# Patient Record
Sex: Female | Born: 1935
Health system: Southern US, Community
[De-identification: ages and names within clinical notes are randomized; demographics above are authoritative.]

## PROBLEM LIST (undated history)

## (undated) DIAGNOSIS — R42 Dizziness and giddiness: Secondary | ICD-10-CM

## (undated) DIAGNOSIS — K5790 Diverticulosis of intestine, part unspecified, without perforation or abscess without bleeding: Secondary | ICD-10-CM

## (undated) DIAGNOSIS — F32A Depression, unspecified: Secondary | ICD-10-CM

## (undated) DIAGNOSIS — F329 Major depressive disorder, single episode, unspecified: Secondary | ICD-10-CM

## (undated) DIAGNOSIS — Z8719 Personal history of other diseases of the digestive system: Secondary | ICD-10-CM

## (undated) DIAGNOSIS — R05 Cough: Secondary | ICD-10-CM

## (undated) DIAGNOSIS — D649 Anemia, unspecified: Secondary | ICD-10-CM

## (undated) DIAGNOSIS — R112 Nausea with vomiting, unspecified: Secondary | ICD-10-CM

## (undated) DIAGNOSIS — R062 Wheezing: Secondary | ICD-10-CM

## (undated) DIAGNOSIS — R0602 Shortness of breath: Secondary | ICD-10-CM

## (undated) DIAGNOSIS — E039 Hypothyroidism, unspecified: Secondary | ICD-10-CM

## (undated) DIAGNOSIS — M1990 Unspecified osteoarthritis, unspecified site: Secondary | ICD-10-CM

## (undated) DIAGNOSIS — R059 Cough, unspecified: Secondary | ICD-10-CM

## (undated) DIAGNOSIS — Z9889 Other specified postprocedural states: Secondary | ICD-10-CM

## (undated) DIAGNOSIS — C801 Malignant (primary) neoplasm, unspecified: Secondary | ICD-10-CM

## (undated) DIAGNOSIS — F0390 Unspecified dementia without behavioral disturbance: Secondary | ICD-10-CM

## (undated) DIAGNOSIS — K219 Gastro-esophageal reflux disease without esophagitis: Secondary | ICD-10-CM

## (undated) HISTORY — PX: JOINT REPLACEMENT: SHX530

## (undated) HISTORY — PX: CATARACT EXTRACTION, BILATERAL: SHX1313

## (undated) HISTORY — PX: TOTAL SHOULDER ARTHROPLASTY: SHX126

## (undated) HISTORY — PX: CHOLECYSTECTOMY: SHX55

## (undated) HISTORY — PX: CARDIOVASCULAR STRESS TEST: SHX262

## (undated) HISTORY — PX: THYROIDECTOMY: SHX17

## (undated) HISTORY — PX: NO PAST SURGERIES: SHX2092

## (undated) HISTORY — PX: EYE SURGERY: SHX253

## (undated) HISTORY — PX: TONSILLECTOMY: SUR1361

## (undated) HISTORY — PX: HERNIA REPAIR: SHX51

---

## 1997-11-28 ENCOUNTER — Ambulatory Visit (HOSPITAL_COMMUNITY): Admission: RE | Admit: 1997-11-28 | Discharge: 1997-11-28 | Payer: Self-pay

## 1998-03-01 ENCOUNTER — Other Ambulatory Visit: Admission: RE | Admit: 1998-03-01 | Discharge: 1998-03-01 | Payer: Self-pay | Admitting: Obstetrics & Gynecology

## 1998-03-01 ENCOUNTER — Ambulatory Visit (HOSPITAL_COMMUNITY): Admission: RE | Admit: 1998-03-01 | Discharge: 1998-03-01 | Payer: Self-pay | Admitting: Obstetrics & Gynecology

## 1998-12-23 ENCOUNTER — Ambulatory Visit (HOSPITAL_COMMUNITY): Admission: RE | Admit: 1998-12-23 | Discharge: 1998-12-23 | Payer: Self-pay | Admitting: Internal Medicine

## 1999-02-28 ENCOUNTER — Ambulatory Visit (HOSPITAL_COMMUNITY): Admission: RE | Admit: 1999-02-28 | Discharge: 1999-02-28 | Payer: Self-pay | Admitting: Obstetrics & Gynecology

## 1999-12-25 ENCOUNTER — Encounter: Payer: Self-pay | Admitting: Internal Medicine

## 1999-12-25 ENCOUNTER — Ambulatory Visit (HOSPITAL_COMMUNITY): Admission: RE | Admit: 1999-12-25 | Discharge: 1999-12-25 | Payer: Self-pay | Admitting: Internal Medicine

## 2000-03-12 ENCOUNTER — Ambulatory Visit (HOSPITAL_COMMUNITY): Admission: RE | Admit: 2000-03-12 | Discharge: 2000-03-12 | Payer: Self-pay | Admitting: *Deleted

## 2001-03-04 ENCOUNTER — Ambulatory Visit (HOSPITAL_COMMUNITY): Admission: RE | Admit: 2001-03-04 | Discharge: 2001-03-04 | Payer: Self-pay | Admitting: Internal Medicine

## 2001-03-04 ENCOUNTER — Ambulatory Visit (HOSPITAL_COMMUNITY): Admission: RE | Admit: 2001-03-04 | Discharge: 2001-03-04 | Payer: Self-pay | Admitting: *Deleted

## 2001-08-05 ENCOUNTER — Encounter: Admission: RE | Admit: 2001-08-05 | Discharge: 2001-08-05 | Payer: Self-pay | Admitting: General Surgery

## 2001-08-05 ENCOUNTER — Encounter: Payer: Self-pay | Admitting: General Surgery

## 2002-01-04 ENCOUNTER — Ambulatory Visit (HOSPITAL_COMMUNITY): Admission: RE | Admit: 2002-01-04 | Discharge: 2002-01-04 | Payer: Self-pay | Admitting: Gastroenterology

## 2002-06-09 ENCOUNTER — Ambulatory Visit (HOSPITAL_COMMUNITY): Admission: RE | Admit: 2002-06-09 | Discharge: 2002-06-09 | Payer: Self-pay | Admitting: *Deleted

## 2004-12-02 ENCOUNTER — Ambulatory Visit: Payer: Self-pay | Admitting: Internal Medicine

## 2005-11-23 ENCOUNTER — Other Ambulatory Visit: Payer: Self-pay

## 2005-11-23 ENCOUNTER — Emergency Department: Payer: Self-pay | Admitting: Emergency Medicine

## 2006-01-05 ENCOUNTER — Ambulatory Visit: Payer: Self-pay | Admitting: Internal Medicine

## 2006-01-07 ENCOUNTER — Ambulatory Visit: Payer: Self-pay | Admitting: Internal Medicine

## 2006-01-08 ENCOUNTER — Ambulatory Visit: Payer: Self-pay | Admitting: Internal Medicine

## 2006-06-21 ENCOUNTER — Ambulatory Visit (HOSPITAL_COMMUNITY): Admission: RE | Admit: 2006-06-21 | Discharge: 2006-06-21 | Payer: Self-pay | Admitting: Gastroenterology

## 2006-06-21 ENCOUNTER — Encounter (INDEPENDENT_AMBULATORY_CARE_PROVIDER_SITE_OTHER): Payer: Self-pay | Admitting: Specialist

## 2007-01-07 ENCOUNTER — Ambulatory Visit: Payer: Self-pay | Admitting: Internal Medicine

## 2007-06-20 ENCOUNTER — Encounter: Admission: RE | Admit: 2007-06-20 | Discharge: 2007-06-20 | Payer: Self-pay | Admitting: Gastroenterology

## 2007-07-11 ENCOUNTER — Encounter: Admission: RE | Admit: 2007-07-11 | Discharge: 2007-07-11 | Payer: Self-pay | Admitting: General Surgery

## 2007-08-22 ENCOUNTER — Ambulatory Visit (HOSPITAL_COMMUNITY): Admission: RE | Admit: 2007-08-22 | Discharge: 2007-08-23 | Payer: Self-pay | Admitting: General Surgery

## 2007-08-22 ENCOUNTER — Encounter (INDEPENDENT_AMBULATORY_CARE_PROVIDER_SITE_OTHER): Payer: Self-pay | Admitting: General Surgery

## 2007-08-25 ENCOUNTER — Ambulatory Visit: Payer: Self-pay | Admitting: Internal Medicine

## 2007-08-25 ENCOUNTER — Inpatient Hospital Stay (HOSPITAL_COMMUNITY): Admission: EM | Admit: 2007-08-25 | Discharge: 2007-08-27 | Payer: Self-pay | Admitting: Emergency Medicine

## 2007-08-31 ENCOUNTER — Encounter (INDEPENDENT_AMBULATORY_CARE_PROVIDER_SITE_OTHER): Payer: Self-pay | Admitting: General Surgery

## 2007-08-31 ENCOUNTER — Ambulatory Visit: Payer: Self-pay

## 2007-09-21 ENCOUNTER — Encounter (INDEPENDENT_AMBULATORY_CARE_PROVIDER_SITE_OTHER): Payer: Self-pay | Admitting: Interventional Radiology

## 2007-09-21 ENCOUNTER — Encounter: Admission: RE | Admit: 2007-09-21 | Discharge: 2007-09-21 | Payer: Self-pay | Admitting: General Surgery

## 2007-09-21 ENCOUNTER — Other Ambulatory Visit: Admission: RE | Admit: 2007-09-21 | Discharge: 2007-09-21 | Payer: Self-pay | Admitting: Interventional Radiology

## 2008-01-26 ENCOUNTER — Ambulatory Visit: Payer: Self-pay | Admitting: Internal Medicine

## 2008-02-22 ENCOUNTER — Encounter: Admission: RE | Admit: 2008-02-22 | Discharge: 2008-02-22 | Payer: Self-pay | Admitting: General Surgery

## 2008-02-24 HISTORY — PX: OTHER SURGICAL HISTORY: SHX169

## 2008-04-05 ENCOUNTER — Encounter (INDEPENDENT_AMBULATORY_CARE_PROVIDER_SITE_OTHER): Payer: Self-pay | Admitting: General Surgery

## 2008-04-05 ENCOUNTER — Ambulatory Visit (HOSPITAL_COMMUNITY): Admission: RE | Admit: 2008-04-05 | Discharge: 2008-04-06 | Payer: Self-pay | Admitting: General Surgery

## 2008-08-16 DIAGNOSIS — C4492 Squamous cell carcinoma of skin, unspecified: Secondary | ICD-10-CM

## 2008-08-16 HISTORY — DX: Squamous cell carcinoma of skin, unspecified: C44.92

## 2008-12-14 ENCOUNTER — Ambulatory Visit: Payer: Self-pay | Admitting: Family Medicine

## 2008-12-21 ENCOUNTER — Inpatient Hospital Stay (HOSPITAL_COMMUNITY): Admission: RE | Admit: 2008-12-21 | Discharge: 2008-12-22 | Payer: Self-pay | Admitting: Orthopedic Surgery

## 2009-01-15 IMAGING — US US BIOPSY
1 series · 13 of 25 positions shown · non-contrast
Comparison: none

CLINICAL DATA: Patient with history of thyroid nodules and follow-
up thyroid ultrasound at [HOSPITAL] on 08/26/2007 which
revealed two dominant nodules approximately 2 cm in diameter in the
right mid to inferior lobe and isthmus region.  Request is now made
for needle aspirate biopsy of the above-mentioned thyroid nodules.

ULTRASOUND-GUIDED NEEDLE ASPIRATE BIOPSY, DOMINANT RIGHT MID
THYROID NODULE
The above procedure was discussed with the patient and written
informed consent was obtained.

[Series 1: us biopsy · 0.07mm/px · 32 acquisitions, 13 frames shown]
[im 1/32]
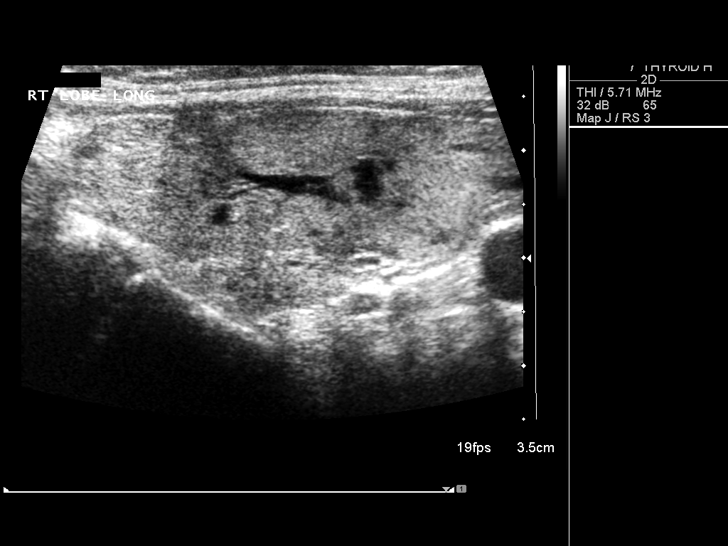
[im 3/32]
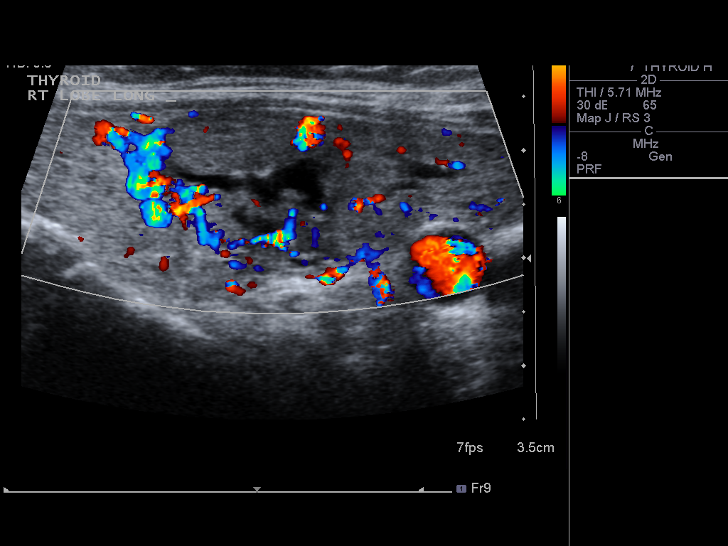
[im 6/32]
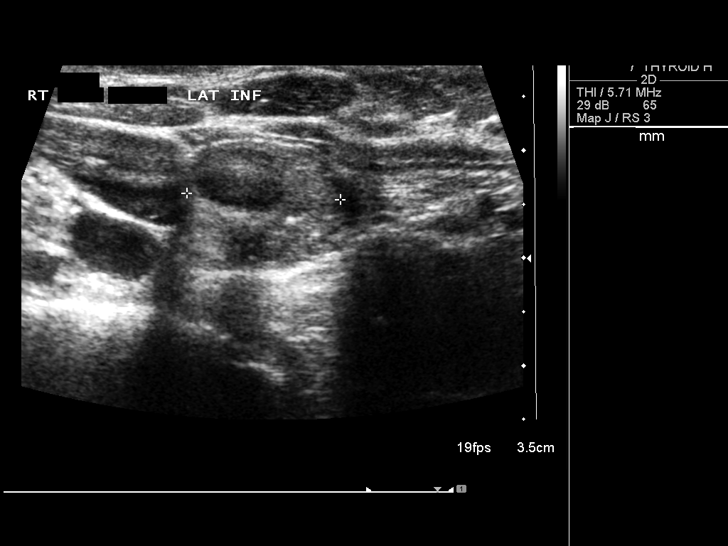
[im 8/32]
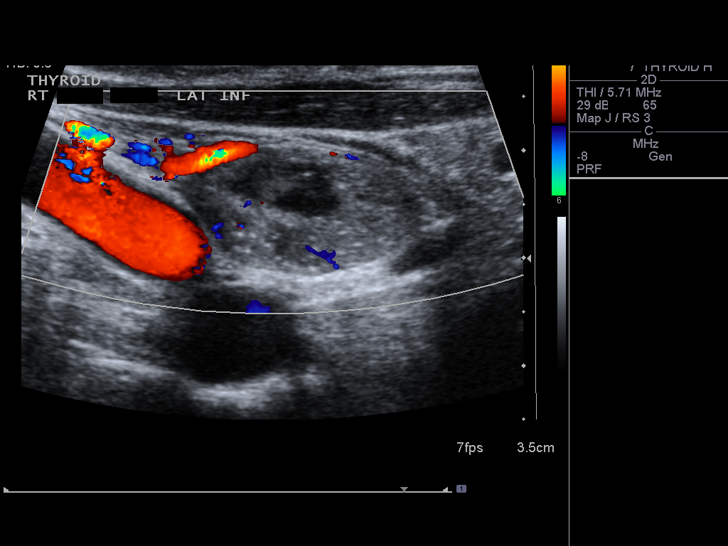
[im 11/32]
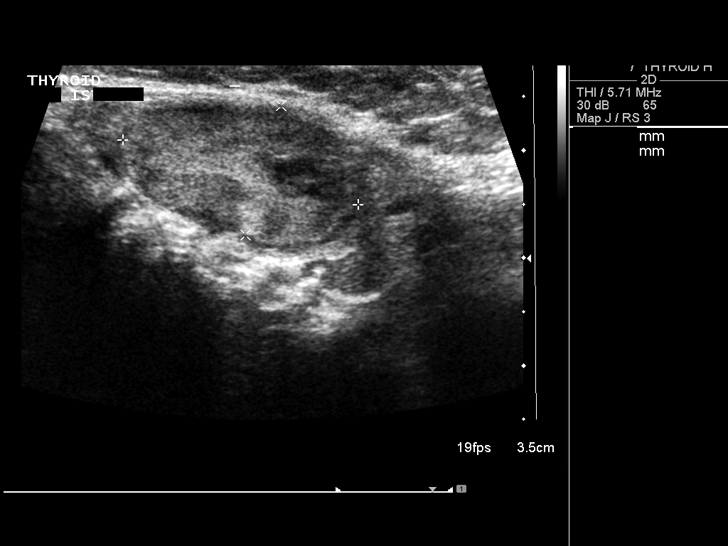
[im 13/32]
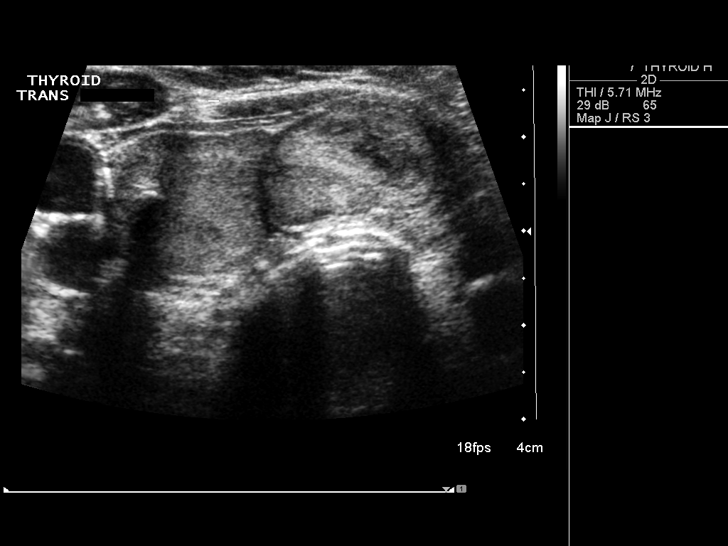
[im 16/32]
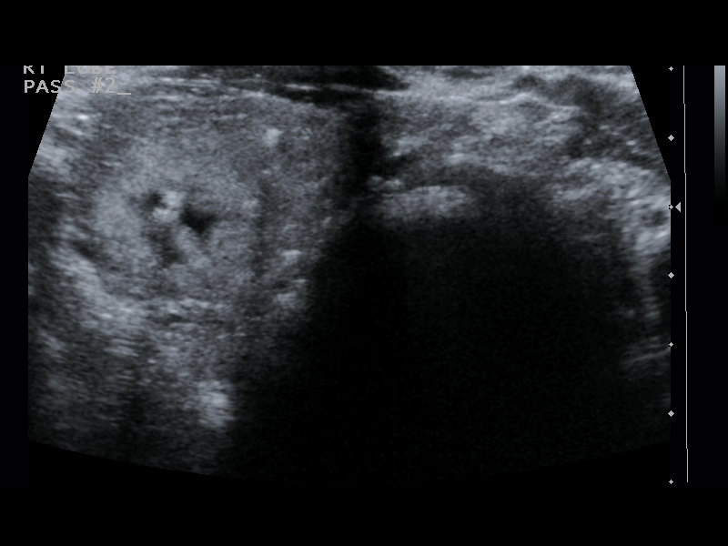
[im 19/32]
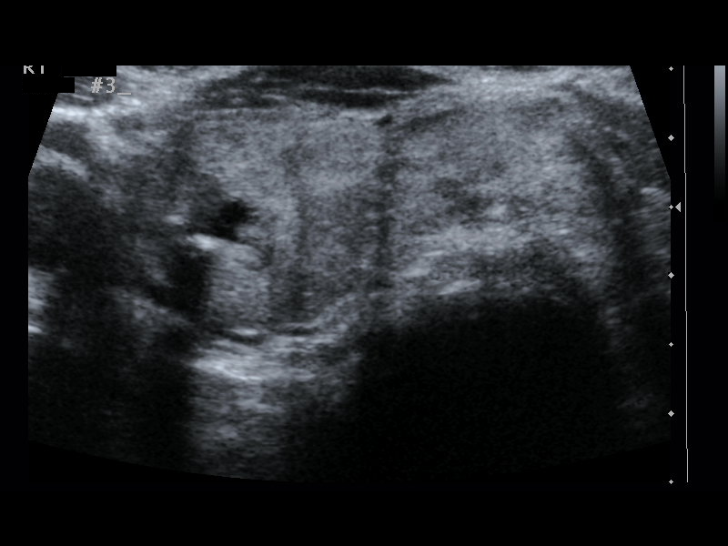
[im 21/32]
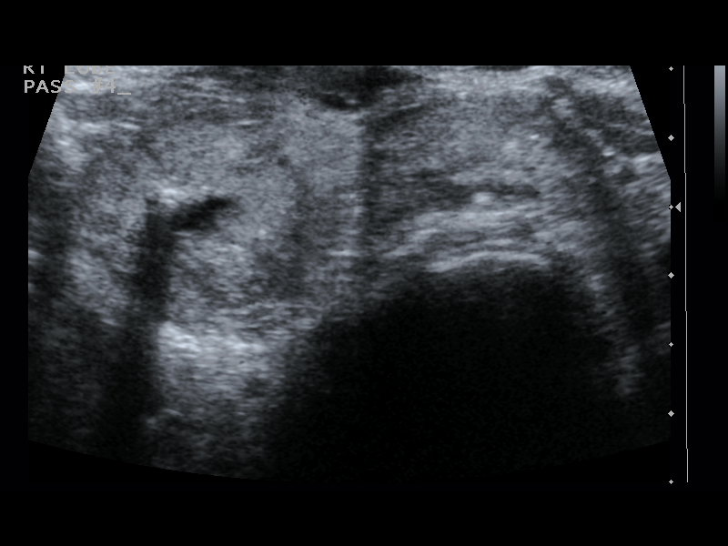
[im 24/32]
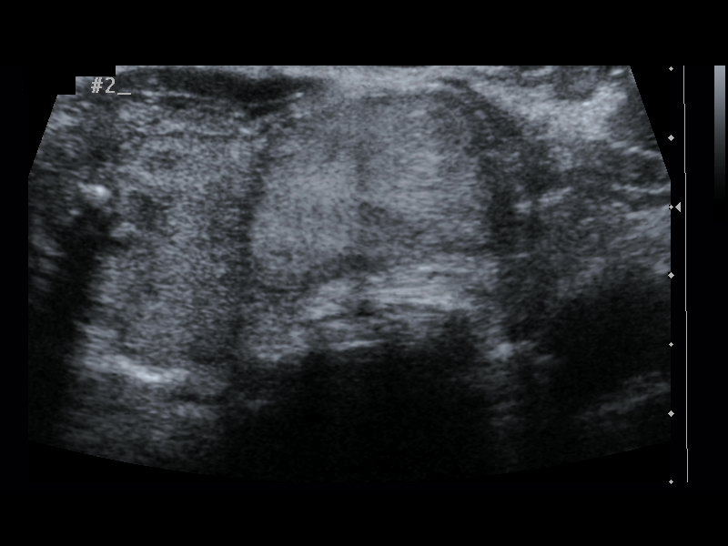
[im 26/32]
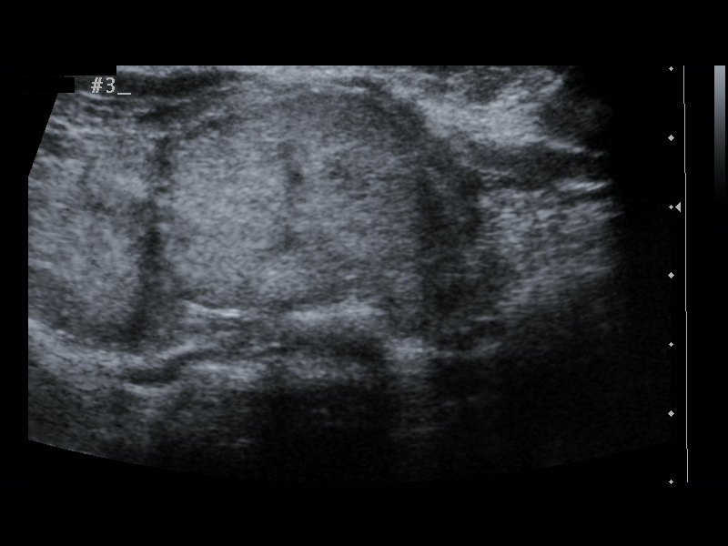
[im 29/32]
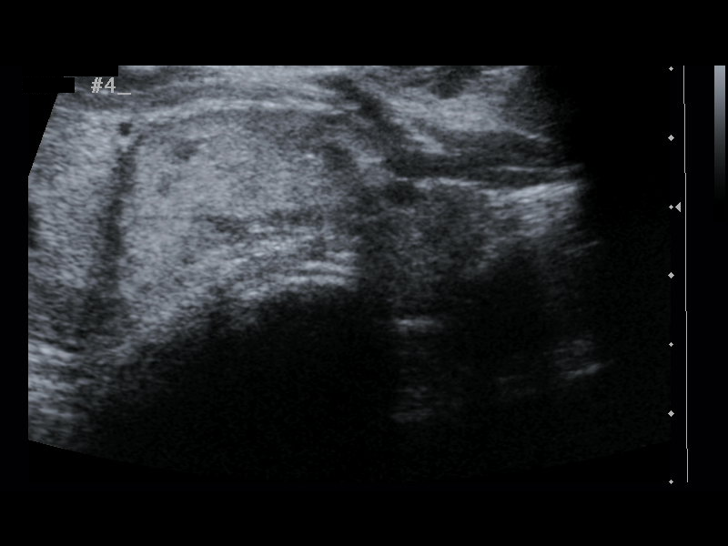
[im 32/32]
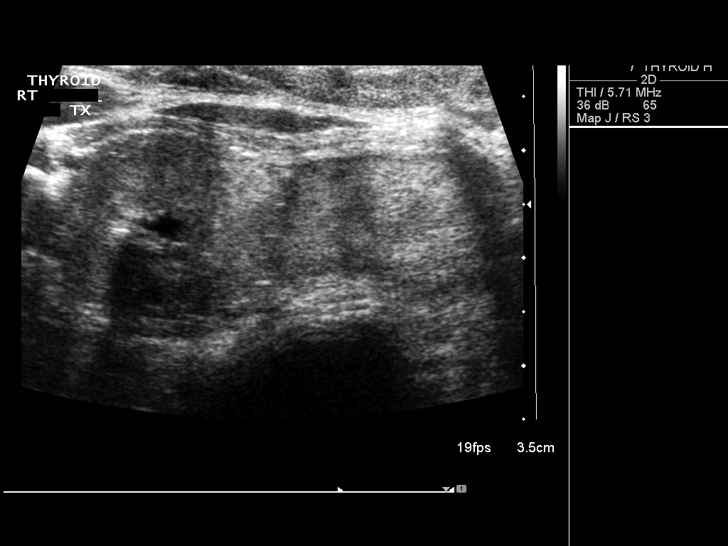

[13 of 25 positions shown; findings below may reference images not displayed]

FINDINGS: Ultrasound was performed to localize and mark an adequate
site for the biopsy. The right thyroid lesion was localized as a
single complex lesion without any discreet separation into the two
nodules previously described.  The patient was then prepped and
draped in a normal sterile fashion.  Local anesthesia was provided
with 1% lidocaine.  Under direct ultrasound guidance, 4 passes were
made using 25 gauge needles into the nodule within the right mid to
inferior  lobe of the thyroid.  Ultrasound was used to confirm
needle placements on all four occasions.  Specimens were sent to
Pathology for analysis.  Post procedural imaging demonstrated no
hematoma or immediate complication.  The patient tolerated the
procedure well.

ULTRASOUND GUIDED NEEDLE ASPIRATE BIOPSY, THYROID ISTHMUS NODULE
FINDINGS: Ultrasound was performed to localize and mark an
adequate site for the biopsy.  The patient was then prepped and
draped in a normal sterile fashion.  Local anesthesia was provided
with 1% lidocaine.  Under direct ultrasound guidance , 4 passes
were made using 25 gauge needles into the nodule within the thyroid
isthmus.  Ultrasound was used to confirm needle placement on all
four occasions.  Specimens were sent to pathology for analysis.
Postprocedural imaging demonstrated no hematoma or immediate
complication.  The patient tolerated procedure well.
IMPRESSION: Successful ultrasound guided needle aspirate biopsies
of a dominant right mid to inferior thyroid lobe and thyroid
isthmus nodules. Pathology pending.

Read by: Oxendine, Sorin.-REINHARDT SEGANENO

## 2009-08-13 ENCOUNTER — Ambulatory Visit (HOSPITAL_COMMUNITY): Admission: RE | Admit: 2009-08-13 | Discharge: 2009-08-13 | Payer: Self-pay | Admitting: General Surgery

## 2010-05-11 LAB — COMPREHENSIVE METABOLIC PANEL
AST: 34 U/L (ref 0–37)
CO2: 27 mEq/L (ref 19–32)
Calcium: 9.1 mg/dL (ref 8.4–10.5)
Creatinine, Ser: 1.01 mg/dL (ref 0.4–1.2)
GFR calc Af Amer: >60 mL/min (ref >60)
GFR calc non Af Amer: 54 mL/min — ABNORMAL LOW (ref >60)
Sodium: 137 mEq/L (ref 135–145)
Total Protein: 7.1 g/dL (ref 6.0–8.3)

## 2010-05-11 LAB — CBC
MCHC: 33.3 g/dL (ref 30.0–36.0)
MCV: 91.4 fL (ref 78.0–100.0)
Platelets: 259 10*3/uL (ref 150–400)
RBC: 4.24 MIL/uL (ref 3.87–5.11)
RDW: 14.7 % (ref 11.5–15.5)

## 2010-05-11 LAB — DIFFERENTIAL
Basophils Absolute: 0 10*3/uL (ref 0.0–0.1)
Basophils Relative: 0 % (ref 0–1)
Eosinophils Absolute: 0.3 10*3/uL (ref 0.0–0.7)
Monocytes Absolute: 0.7 10*3/uL (ref 0.1–1.0)
Neutro Abs: 4.7 10*3/uL (ref 1.7–7.7)

## 2010-05-11 LAB — TYPE AND SCREEN

## 2010-05-29 LAB — BASIC METABOLIC PANEL
BUN: 10 mg/dL (ref 6–23)
BUN: 8 mg/dL (ref 6–23)
CO2: 30 mEq/L (ref 19–32)
Calcium: 7.9 mg/dL — ABNORMAL LOW (ref 8.4–10.5)
Calcium: 8.1 mg/dL — ABNORMAL LOW (ref 8.4–10.5)
Chloride: 102 mEq/L (ref 96–112)
Creatinine, Ser: 0.84 mg/dL (ref 0.4–1.2)
Creatinine, Ser: 0.86 mg/dL (ref 0.4–1.2)
GFR calc non Af Amer: >60 mL/min (ref >60)
Glucose, Bld: 106 mg/dL — ABNORMAL HIGH (ref 70–99)
Glucose, Bld: 118 mg/dL — ABNORMAL HIGH (ref 70–99)
Glucose, Bld: 98 mg/dL (ref 70–99)
Potassium: 4 mEq/L (ref 3.5–5.1)

## 2010-05-29 LAB — DIFFERENTIAL
Basophils Absolute: 0.1 10*3/uL (ref 0.0–0.1)
Basophils Relative: 2 % — ABNORMAL HIGH (ref 0–1)
Eosinophils Absolute: 0.2 10*3/uL (ref 0.0–0.7)
Eosinophils Relative: 3 % (ref 0–5)
Neutrophils Relative %: 61 % (ref 43–77)

## 2010-05-29 LAB — CBC
HCT: 32.3 % — ABNORMAL LOW (ref 36.0–46.0)
MCHC: 33.9 g/dL (ref 30.0–36.0)
MCHC: 34.1 g/dL (ref 30.0–36.0)
MCV: 89.9 fL (ref 78.0–100.0)
Platelets: 225 10*3/uL (ref 150–400)
Platelets: 297 10*3/uL (ref 150–400)
RDW: 14.4 % (ref 11.5–15.5)
RDW: 14.4 % (ref 11.5–15.5)
WBC: 8.1 10*3/uL (ref 4.0–10.5)

## 2010-05-29 LAB — CROSSMATCH
ABO/RH(D): O NEG
Antibody Screen: NEGATIVE

## 2010-05-29 LAB — URINALYSIS, ROUTINE W REFLEX MICROSCOPIC
Bilirubin Urine: NEGATIVE
Hgb urine dipstick: NEGATIVE
Protein, ur: NEGATIVE mg/dL
Urobilinogen, UA: 1 mg/dL (ref 0.0–1.0)

## 2010-05-29 LAB — PROTIME-INR
INR: 0.97 (ref 0.00–1.49)
Prothrombin Time: 12.8 seconds (ref 11.6–15.2)

## 2010-06-10 LAB — DIFFERENTIAL
Lymphocytes Relative: 29 % (ref 12–46)
Lymphs Abs: 2 10*3/uL (ref 0.7–4.0)
Neutro Abs: 4.1 10*3/uL (ref 1.7–7.7)
Neutrophils Relative %: 59 % (ref 43–77)

## 2010-06-10 LAB — COMPREHENSIVE METABOLIC PANEL
AST: 23 U/L (ref 0–37)
CO2: 28 mEq/L (ref 19–32)
Calcium: 9.3 mg/dL (ref 8.4–10.5)
Creatinine, Ser: 0.87 mg/dL (ref 0.4–1.2)
GFR calc Af Amer: >60 mL/min (ref >60)
GFR calc non Af Amer: >60 mL/min (ref >60)
Glucose, Bld: 93 mg/dL (ref 70–99)

## 2010-06-10 LAB — PROTIME-INR
INR: 0.9 (ref 0.00–1.49)
Prothrombin Time: 12.7 seconds (ref 11.6–15.2)

## 2010-06-10 LAB — CBC
MCHC: 33.1 g/dL (ref 30.0–36.0)
MCV: 88 fL (ref 78.0–100.0)
RBC: 4.29 MIL/uL (ref 3.87–5.11)

## 2010-07-08 NOTE — Op Note (Signed)
Brenda Goodman, Brenda Goodman NO.:  [id number]   MEDICAL RECORD NO.:  [id number]          PATIENT TYPE:  AMB   LOCATION:  DAY                          FACILITY:  [facility]   PHYSICIAN:  Adolph Pollack, M.D.DATE OF BIRTH:  02-24-1932   DATE OF PROCEDURE:  08/22/2007  DATE OF DISCHARGE:                               OPERATIVE REPORT   PREOPERATIVE DIAGNOSIS:  Symptomatic cholelithiasis.   POSTOPERATIVE DIAGNOSIS:  Symptomatic cholelithiasis.   PROCEDURE:  Laparoscopic cholecystectomy, interrupted cholangiogram.   SURGEON:  Adolph Pollack, M.D.   ANESTHESIA:  General.   INDICATIONS:  Brenda Goodman is status post a redo Nissen fundoplication and  repair of a paraesophageal hernia 11 years ago.  She has had some  recurrence of her hernia.  She has some pressure-type discomfort in the  epigastric region with some nausea and vomiting at times.  A moderate-  size recurrent hiatal hernia is noted, but the gallbladder is also  abnormal, containing sludge and small stones.  The liver functions are  normal.  We feel this could be either due to the biliary colic-type pain  or maybe the recurrent hernia.  She has had some medications changed by  Dr. Loreta Ave, and symptoms are a little better.  After discussion with her,  she would like to proceed with cholecystectomy.  The procedure and the  risks were discussed with her preoperatively.   TECHNIQUE:  She was seen in the holding area, and brought to the  operating room, placed supine on the operating table.  General  anesthetic was administered.  The abdominal wall was sterilely prepped  and draped.  Marcaine solution was infiltrated in the subumbilical  region.  A subumbilical incision was made through the skin and  subcutaneous tissue, fascia, and the peritoneal cavity was entered under  direct vision.  A pursestring suture of 0 Vicryl placed around the  fascial edges.  A Hasson trocar was introduced into the peritoneal  cavity,  and pneumoperitoneum created by insufflation of CO2 gas.   Next, a laparoscope was introduced, and there were no significant  adhesions in the right upper quadrant.  She was placed in the reverse  Trendelenburg position and the right side tilted slightly up.   An 11 mm trocar was then placed in the epigastrium, and two 5 mm trocar  was in the right midlateral abdomen.  The fundus of the gallbladder was  grasped and retracted to the right shoulder.  Adhesions between the  gallbladder and the omentum were able to be bluntly dissected, freeing  up the gallbladder.  The infundibulum was then grasped and retracted  laterally and mobilized.  I identified the cystic duct.  A window was  created around it.  A clip was placed at the cystic duct/gallbladder  junction.  A small incision was made in the cystic duct, and milked some  sludge back from this.  I then placed a cholangiogram catheter into the  abdominal cavity, then into the cystic duct, and performed a  cholangiogram.   Under real-time fluoroscopy, dilute contrast was injected into the  cystic duct which was  of moderate length.  The common hepatic, right  hepatic, and common bile ducts all filled promptly, and contrast drained  promptly into the duodenum.  The common bile duct appeared to be upper  limits of normal note.  However, there did not the appear to be any  obvious lesions or obstructing stones.  Final reports pending the  radiologist's interpretation.   The cholangiocatheter was removed, and the cystic duct was then clipped  three times on the biliary side and divided.  I then identified an  anterior and posterior branch of the cystic artery.  These were clipped  and divided.  The gallbladder was then dissected from the liver using  electrocautery.  The upper trocar had punctured a holes, so I had some  spillage of bile but no stones.  The gallbladder was then placed in an  Endopouch bag after removal from the liver.  I then  copiously irrigated  out the hepatic fossa and controlled bleeding with electrocautery.  I  then evacuated the irrigation.  Surgicel was placed in the gallbladder  fossa.  There was no evidence of bleeding or bile leak at this time.   Gallbladder was then removed through the subumbilical port.  The  subumbilical fascial defect was closed by tightening up and tying down  the pursestring suture.  I evacuated as much irrigation fluid as  possible, then removed the remaining trocars and released the  pneumoperitoneum.   The skin incisions were then closed with 4-0 Monocryl subcuticular  stitches, followed by Steri-Strips and sterile dressings.  She tolerated  the procedure without any apparent complications and was taken to the  recovery room in satisfactory condition.      Adolph Pollack, M.D.  Electronically Signed     TJR/MEDQ  D:  08/22/2007  T:  08/22/2007  Job:  [id number]   cc:   Anselmo Rod, M.D.  Fax: [phone]

## 2010-07-08 NOTE — H&P (Signed)
NAMEHARLOW, BASLEY NO.:  [id number]   MEDICAL RECORD NO.:  [id number]          PATIENT TYPE:  AMB   LOCATION:  DAY                          FACILITY:  [facility]   PHYSICIAN:  Adolph Pollack, M.D.DATE OF BIRTH:  [birth year]   DATE OF ADMISSION:  04/05/2008  DATE OF DISCHARGE:                              HISTORY & PHYSICAL   REASON FOR HER ADMISSION:  Elective right thyroid lobectomy.   HISTORY:  Ms. Bartko is a 77 year old female who has a known thyroid  nodule.  A fine-needle aspiration was performed in the past and for this  some small amount of follicular cells were noted consistent with, what  was felt to be, a follicular hyperplastic nodule; however, this nodule  has continued to enlarge in the right mid-thyroid and isthmus area and  for this reason I have recommended right thyroid lobectomy.  We  discussed procedure and the risks and she is agreed.Marland Kitchen   PAST MEDICAL HISTORY:  1. Gastroesophageal reflux disease.  2. Post Nissen fundoplication paraesophageal hernia.  3. Anxiety.  4. Thyroid nodule.   PREVIOUS OPERATIONS.:  1. Laparoscopic Nissen fundoplication.  Hiatal hernia repair.  2. Redo laparoscopic paraesophageal hernia repair.  3. Laparoscopic cholecystectomy.   DRUG ALLERGIES:  None.   CURRENT MEDICATIONS:  Prozac, Protonix, probiotics, calcium.   SOCIAL HISTORY:  Married.  No tobacco use and occasional alcohol use.   PHYSICAL EXAM:  GENERAL:  This is a slight and nervous appearing female,  very pleasant and cooperative.  VITAL SIGNS: Temperature is 97.6, blood pressure is 152/85, pulse 68, O2  saturations 97% room air.  HEENT: Normocephalic, atraumatic.  EOMI.  NECK:  Is noted for a large right-sided nodule, it varies with swallow.  RESPIRATORY:  Breath sounds equal and clear.  CARDIOVASCULAR: Regular rate, regular rhythm.  ABDOMEN: Soft with multiple small scars noted.  EXTREMITIES:  SCDs are on.   IMPRESSION:  Follicular nodule  right lobe of the thyroid gland.  It is  getting larger.   PLAN:  If right thyroid lobectomy and frozen section shows malignancy,  total thyroidectomy.      Adolph Pollack, M.D.  Electronically Signed     TJR/MEDQ  D:  04/05/2008  T:  04/05/2008  Job:  [id number]

## 2010-07-08 NOTE — Op Note (Signed)
NAMEKEERAT, DENICOLA NO.:  [id number]   MEDICAL RECORD NO.:  [id number]          PATIENT TYPE:  AMB   LOCATION:  DAY                          FACILITY:  [facility]   PHYSICIAN:  Adolph Pollack, M.D.DATE OF BIRTH:  May 31, 1932   DATE OF PROCEDURE:  04/05/2008  DATE OF DISCHARGE:                               OPERATIVE REPORT   PREOPERATIVE DIAGNOSIS:  Follicular lesion right lobe of thyroid gland.   POSTOPERATIVE DIAGNOSIS:  Follicular lesion right lobe of thyroid gland.   PROCEDURE:  Right thyroid lobectomy.   SURGEON:  Rosenbower.   ASSISTANT:  Darnell Level, MD   ANESTHESIA:  General.   INDICATIONS:  This 77 year old female has a large nodule, right thyroid  gland we have been monitoring.  It is getting larger.  It has some scant  follicular cells on needle biopsy in the past that were said to be  benign.  Because it is enlarging, recommendation for right thyroid  lobectomy with frozen section has been made, and she presents for that.   TECHNIQUE:  She is seen in the holding area and the right side of her  neck marked with my initials.  She is then brought to the operating  room, placed supine on the operating table, and general anesthetic was  administered.  Her head was placed in slight extension.  The upper chest  and neck were sterilely prepped and draped.  A transverse incision was  made in the lower neck dividing the skin, subcutaneous tissue and  platysma muscle.  Subplatysmal flaps were raised inferiorly to the  sternal notch and superiorly to the thyroid cartilage.  The deep  cervical fascia between the strap muscles was divided, separating the  strap muscles in the midline.  I could feel the enlarged medial aspect  of the right thyroid lobe with a large nodule.  There is also smaller  nodule in the midportion.  Using blunt dissection, I separated the strap  muscles from the right lobe of the thyroid gland and the nodules.  Starting inferiorly and  with dissection on the thyroid gland, I divided  the inferior thyroidal vessels between clips and using harmonic scalpel,  mobilizing the inferior lobe of the thyroid gland and leaving  parathyroid-appearing tissue behind.   I then approached the superior aspect of the gland.  With dissection  close to the gland, I identified the superior thyroidal vessels and  divided them between clips and ties close to the gland.  I then  approached the midportion of the gland and using blunt dissection was  able to mobilize the gland up to the field.  I divided some middle  thyroidal vein branches close to the gland and noted a superior  parathyroid gland which was preserved.  I kept my plane of dissection  above the area of the recurrent laryngeal nerve and superior parathyroid  gland.  I then was able to free up the right lobe from the trachea.  I  identified the isthmus and also pyramidal lobe and using harmonic  scalpel, I was able to mobilize the parabola lobe and then divide  the  isthmus of the thyroid gland, resecting the right lobe of thyroid gland  and the enlarged nodules.  The superior aspect of the right lobe was  marked with a suture, and it was sent for frozen section.   While for the frozen section, I irrigated out the wound, and hemostasis  was adequate.  I placed some Surgicel in the wound.  I then began  closing the strap muscles with interrupted Vicryl sutures and  approximated the platysma with interrupted Vicryl sutures.  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  I subsequently completed the skin closure with running 4-0  Monocryl subcuticular stitch.  Steri-Strips and sterile dressings were  applied.   She tolerated procedure well without apparent complications and was  taken to recovery in satisfactory condition.      Adolph Pollack, M.D.  Electronically Signed     TJR/MEDQ  D:  04/05/2008  T:  04/05/2008  Job:  [id number]

## 2010-07-08 NOTE — H&P (Signed)
NAMELEO, Brenda Goodman NO.:  [id number]   MEDICAL RECORD NO.:  [id number]          PATIENT TYPE:  EMS   LOCATION:  ED                           FACILITY:  [facility]   PHYSICIAN:  Adolph Pollack, M.D.DATE OF BIRTH:  [birth year]   DATE OF ADMISSION:  08/25/2007  DATE OF DISCHARGE:                              HISTORY & PHYSICAL   CHIEF COMPLAINT:  Nausea and vomiting, right upper quadrant pain,  intermittent dyspnea following laparoscopic cholecystectomy.   HISTORY OF PRESENT ILLNESS:  This is a 77 year old female who was status  post laparoscopic cholecystectomy August 22, 2007.  Yesterday she began  having the onset of some nausea, vomiting, low-grade fever, intermittent  dyspnea and some right upper quadrant pain.  This has persisted.  I  subsequently had her come to the emergency department for further  evaluation.   PAST MEDICAL HISTORY:  1. Gastroesophageal reflux disease.  2. Paraesophageal hernia.  3. Recurrent paraesophageal hernia.  4. Chronic cholecystitis with cholelithiasis.   PAST SURGICAL HISTORY:  Previous operations:  1. Laparoscopic Nissen fundoplication.  2. Laparoscopic paraesophageal hernia repair.  3. Laparoscopic cholecystectomy.   ALLERGIES:  None.   MEDICATIONS:  1. Tylox.  2. Protonix.  3. Calcium.   SOCIAL HISTORY:  She is married.  No tobacco use.  Occasional alcohol  use.   REVIEW OF SYSTEMS:  Notable for sore throat.   PHYSICAL EXAM:  GENERAL:  A slightly ill-appearing female but very  pleasant.  VITAL SIGNS:  Temperature is 99.6, blood pressure is 136/75, pulse 65,  respiratory rate 14, O2 sats 92% on room air.  She is in no acute  distress.  HEENT:  Normocephalic, atraumatic.  EOMI.  No icterus.  NECK:  Supple without mass.  Oropharynx demonstrates small little red  areas in the soft palate area in the tonsillar area.  CARDIOVASCULAR:  Regular rate and regular rhythm with no murmur.  CHEST:  Demonstrates decreased  breath sounds right base with slight  crackles.  ABDOMEN:  Soft.  There is some mild right upper quadrant tenderness over  one of her 5 mm incisions.  Rest of incisions were clean and intact and  noted to have no significant tenderness.  MUSCULOSKELETAL:  There is no calf or thigh swelling.  SKIN:  No jaundice.   LABORATORY DATA:  Hemoglobin 10.6, white cell count 14,900.  Total  bilirubin is 1.7, alk phos 108, SGOT 107, SGPT 99.  Amylase, lipase  normal.   Chest x-ray demonstrates some right-sided effusion, airspace disease and  some pulmonary edema with cardiomegaly.  Cannot rule out pulmonary  embolism.  HIDA scan is negative for a leak.   IMPRESSION:  1. Nausea and vomiting and right upper quadrant pain after      laparoscopic cholecystectomy - no evidence of active bile leak.      Could have had a transient leak or this could be reactive to her      nausea and vomiting.  On the cholangiogram she did have some      narrowing in her distal bile duct but no obvious lesion present.  Preop liver function tests have been normal.  2. Some pulmonary edema, cardiomegaly, question mild congestive heart      failure in somebody without cardiac history.  3. Dyspnea question pulmonary embolus or related to pulmonary edema.   PLAN:  Will admit to the hospital and start intravenous antibiotics  empirically.  Will get a CT angiogram of the chest as well as CT of the  abdomen and pelvis.  Will start gentle diuresis and request internal  medicine consultation.      Adolph Pollack, M.D.  Electronically Signed     TJR/MEDQ  D:  08/25/2007  T:  08/25/2007  Job:  [id number]

## 2010-07-08 NOTE — Consult Note (Signed)
NAMEANAEL, Brenda Goodman NO.:  [id number]   MEDICAL RECORD NO.:  [id number]            PATIENT TYPE:   LOCATION:                                 FACILITY:   PHYSICIAN:  Valerie A. Felicity Coyer, MD     DATE OF BIRTH:   DATE OF CONSULTATION:  DATE OF DISCHARGE:                                 CONSULTATION   CONSULTING PHYSICIAN:  Adolph Pollack, M.D.   PRIMARY CARE PHYSICIAN:  [facility] in [place]   REASON FOR CONSULTATION:  Shortness of breath with new pulmonary  edema/cardiomegaly on x-ray.   HISTORY OF PRESENT ILLNESS:  The patient is a pleasant 77 year old woman  who is status post laparoscopic cholecystectomy on June 29 who now  returns to [facility] emergency room with a one day history of nausea,  vomiting with right upper quadrant pain and low grade fever.  This has  also been associated with dyspnea since discharge. She reports several  months history of increasing dyspnea but is unable to relate shortness  of breath is due to exertion and activity.  She has had no history of  CHF or coronary artery disease.  No recent chest pain or palpitations.  No cough or sputum.  She states she does have to sit down several times  while doing housework, which is new for her. There has been no lower  extremity swelling.   PAST MEDICAL HISTORY:  Reviewed.  Significant for:  1. Gastroesophageal reflux disease.  2. Chronic cholelithiasis status post laparoscopic cholecystectomy      June 29 as noted.  3. Esophageal hernia.  4. Status post Nissan fundoplication hernia repair.   MEDICATIONS:  1. Protonix 40 mg daily.  2. Tylox p.r.n.  3. Calcium daily.   ALLERGIES:  No known drug allergies.   FAMILY HISTORY:  Mother died in her 38s due to coronary disease.  Father  died at age 26 due to throat cancer.   SOCIAL HISTORY:  She is married.  She does not smoke or drink.   REVIEW OF SYSTEMS:  GENERAL:  Positive for low grade fever. No anorexia  or weight  change.  HEENT:  Positive for sore throat.  Negative for sinus  congestion, normal hearing.  Normal vision. CARDIOVASCULAR: Is negative  for chest pain, negative palpitations.  Positive ankle edema, but no leg  swelling, positive shortness of breath.  Negative PND.  RESPIRATORY:  Positive shortness of breath, occasional dry cough, but no sputum. No  history of asthma or emphysema.  GI:  Positive nausea and vomiting.  Positive constipation.  Positive right upper quadrant pain. No flank  pain.  GU:  Negative dysuria.  Negative hematuria.  MUSCULOSKELETAL:  No  aches or arthritis and no joint swelling.  ENDOCRINE: No polydipsia or  polyuria.  No history of diabetes.  NEURO:  Negative for headache,  negative dizziness.  Negative for focal weakness.   PHYSICAL EXAM:  Temperature 99.6, blood pressure 136/75, pulse 65,  respirations 14, satting 93% on room air initially, now 88% on room air.  In general, she is awake, alert, oriented  in no acute distress.  HEENT:  Her head is normocephalic and atraumatic.  Eyes are PERRLA.  EOMI.  No scleral jaundice. ENT is grossly normal.  Hearing intact.  Positive history of pharyngeal erythema, but no swelling or exudate.  Mucous membranes moist.  NECK: Her neck is supple.  There is no  thyromegaly, no masses palpable.  CHEST:  Chest is nontender with symmetrical movement.  Respiratory shows  mild bibasilar crackles.  There is no increased work of breathing or use  of accessory muscles.  CARDIOVASCULAR:  Normal S1-S2 with regular rate and rhythm.  No murmurs,  rubs or gallops.  No significant bilateral lower extremity edema.  No  appreciable JVD or carotid bruits.  ABDOMEN: Shows soft abdomen which is nondistended.  Bowel sounds  diminished, but present. Incision dressing is clean, dry and intact.  There is no appreciable mass or hepatosplenomegaly.  No rebound or  guarding.  SKIN:  Shows no rashes, lesions, nodules or bruising.  NEUROLOGICAL:  She is  awake, alert and oriented x3.  No motor or sensory  deficits grossly on exam. Gait is not tested. PSYCHIATRIC: She has a  normal mood and normal affect.   LABORATORY DATA:  Reviewed.  White count 14.9, hemoglobin 10.6, previous  hemoglobin 12.4 preop on June 25. Platelets normal at 236.  Basic  metabolic unremarkable including creatinine of 0.9, albumin of 3.1.  LFTs slightly increased since preop values. Now AST 107, ALT 99, alk  phos 108 and total bili of 1.7.  She has a normal amylase and lipase.  Rapid strep was negative. HIDA scan done shows negative for bile leak.  Plain chest film shows small right effusion and right airspace disease  consistent with either pneumonia versus PE and infarct versus intra-  abdominal process with peri-pneumonic reaction.   ASSESSMENT/PLAN:  1. Dyspnea. No significant hypoxia initially on presentation, but now      with further decline. Will await CT to rule out pulmonary embolism      results. Anticoagulation full dose if positive for pulmonary      embolism.  Agree with empiric antibiotics as ordered by surgery      including Unasyn for possible pneumonia, especially with her low      fever and leukocytosis. Plan to recheck CBC in the morning. Oxygen      p.r.n. for symptoms or sats below 89%.  Given to her cardiomegaly      and question edema on chest x-ray, will also evaluate for possible      congestive heart failure for volume overload post op by checking      BNP TSH, EKG and serial cardiac enzymes.  Plan to check 2-D echo      and treat with a trial of Lasix 40 mg IV times one as ordered.      Will follow daily weights I&Os.  Suspect her symptoms may be      multifactorial and will help discern treatment based on response to      these trials.  2. Abdominal pain with nausea, vomiting, status post cholecystectomy 3      days ago now admitted by surgery for further eval. HIDA scan is      negative mildly, but with new increased LFTs there is  concern for      other intra-abdominal process. She is on Unasyn empirically. Await      results of CT abdomen, pelvis and defer management to surgery.  3. Acute blood loss  anemia.  Hemoglobin has drops approximately 2      grams in the post and peri-operative period, but not unexpected.      The surgical procedure may be contributing however to symptomatic      dyspnea.   PROBLEM:  1. Plan to recheck hemoglobin on CBC in the a.m. and note that there      are no symptoms or history for active      bleeding. Also check anemia panel in the a.m.  2. Gastroesophageal reflux disease history. Continue PPI for reflux.      No current symptoms.  Please see orders for further details. We      appreciate the call and will followup in the a.m.      Valerie A. Felicity Coyer, MD  Electronically Signed     VAL/MEDQ  D:  08/25/2007  T:  08/25/2007  Job:  [id number]

## 2010-07-08 NOTE — Discharge Summary (Signed)
Brenda Goodman, PUA NO.:  [id number]   MEDICAL RECORD NO.:  [id number]          PATIENT TYPE:  INP   LOCATION:  1532                         FACILITY:  [facility]   PHYSICIAN:  Adolph Pollack, M.D.DATE OF BIRTH:  1932-11-16   DATE OF ADMISSION:  08/25/2007  DATE OF DISCHARGE:  08/27/2007                               DISCHARGE SUMMARY   PRINCIPAL DISCHARGE DIAGNOSIS:  Abdominal pain.   SECONDARY DIAGNOSES:  1. Pulmonary edema.  2. Thyroid nodules.  3. Recurrent hiatal hernia.   PROCEDURES:  None.   REASON FOR ADMISSION:  This is a 77 year old female who underwent a  laparoscopic cholecystectomy August 22, 2007.  She began having some  abdominal pain, dyspnea, nausea, vomiting, low grade fever.  She was  seen, admitted to the hospital with low grade fever, mild tenderness,  and no thigh swelling.  Slight crackles on chest x-ray.  White count was  elevated and she had a slight elevation of some of her liver function  tests and she was admitted.   HOSPITAL COURSE:  She underwent a HIDA scan, which was negative for  leak.  She underwent a chest CT to rule out pulmonary embolism as well  as abdominal and pelvic CT.  There was no pulmonary embolism on the  chest x-ray and chest CT suggested some pulmonary edema and a possible  early infiltrate suggestive of pneumonia.  She had a chest x-ray preop  suggesting a lung nodule but the chest CT did not substantiate this.  Also, on the chest CT she is noted to have multiple thyroid nodules.  There is a fluid collection in the gallbladder fossa but no obvious  indication of infection.  I obtained a medical consultation from the  [facility].  She was started on gentle diuresis.  They felt that an  outpatient echocardiogram would be pertinent and empiric Avelox for  possible pneumonia was recommended.  On August 27, 2007, she is feeling  much better without complaints on the IV Avelox.  She was discharged  home.   DISPOSITION:  Discharged home August 27, 2007.  She is given a 7-day course  of Avelox.  She will need to follow up.  She will follow up with me in  the office a regular scheduled appointment.  Will discuss her  echocardiogram result that time as well as talk about further workup for  the thyroid nodules.  She was in satisfactory condition.      Adolph Pollack, M.D.  Electronically Signed     TJR/MEDQ  D:  09/19/2007  T:  09/19/2007  Job:  [id number]

## 2010-07-11 NOTE — Op Note (Signed)
NAME:  Brenda Goodman, Brenda Goodman              ACCOUNT NO.:  [id number]   MEDICAL RECORD NO.:  [id number]          PATIENT TYPE:  AMB   LOCATION:  ENDO                         FACILITY:  [facility]   PHYSICIAN:  Anselmo Rod, M.D.  DATE OF BIRTH:  [birth year]   DATE OF PROCEDURE:  06/21/2006  DATE OF DISCHARGE:                               OPERATIVE REPORT   PROCEDURE PERFORMED:  Esophagogastroduodenoscopy with cold biopsies x2.   ENDOSCOPIST:  Anselmo Rod, M.D.   INSTRUMENT USED:  Pentax video panendoscope.   INDICATIONS FOR PROCEDURE:  A 77 year old white female with a history of  reflux, status post Nissen fundoplication now develops chronic cough  with dysphagia, rule out esophagitis and stricture, etc.   OPERATION:  Informed consent was procured from the patient.  The patient  fasted for 8 hours prior to the procedure.  Risks and benefits of the  procedure were discussed with the patient in great detail.   PREPROCEDURE PHYSICAL:  VITAL SIGNS:  The patient had stable vital  signs.  NECK:  Supple.  CHEST:  Clear to auscultation.  HEART:  S1 and S2 regular.  ABDOMEN:  Soft with normal bowel sounds.   DESCRIPTION OF PROCEDURE:  The patient was placed in the left lateral  decubitus position and sedated with 50 mcg of Fentanyl and 4 mg of  Versed given intravenously in slow incremental doses.  Once the patient  was adequately sedated and maintained on low-flow oxygen and continuous  cardiac monitoring, the Pentax video panendoscope was advanced through  the mouthpiece, over the tongue and into the esophagus under direct  vision.  The entire esophagus seemed widely patent with no evidence of  ring, stricture, mass, esophagitis or Barrett's mucosa.  The scope was  advanced to the stomach.  There was evidence of Nissen fundoplication  noted on high retroflexion.  A small sessile polyp was biopsied from the  high cardia (cold biopsies x2).  The rest of the gastric mucosa and the  proximal  small bowel appeared normal.  There was no outlet obstruction,  no erosions, ulcerations, masses or polyps were seen.  The patient  tolerated the procedure well without complications.   IMPRESSION:  1. Normal-appearing esophagus with no evidence of ring, stricture,      mass, esophagitis, Barrett's mucosa.  2. Small sessile polyp biopsied from the proximal stomach (high      cardia).  3. Status post Nissen fundoplication, postoperative changes noted on      retroflexion, the sutures seemed to be intact.  4. Normal proximal small bowel.   RECOMMENDATIONS:  A pulmonary evaluation has been recommended.  The  patient is to avoid very hot or very cold liquids.  If her dysphagia  persists, esophageal manometry will be done.  Further recommendations to  be made in follow-up which is scheduled in the next two weeks.      Anselmo Rod, M.D.  Electronically Signed     JNM/MEDQ  D:  06/21/2006  T:  06/21/2006  Job:  [id number]   cc:   Nyoka Cowden, M.D.  Adolph Pollack, M.D.

## 2010-07-11 NOTE — Op Note (Signed)
NAME:  Brenda Goodman, Brenda Goodman NO.:  [id number]   MEDICAL RECORD NO.:  [id number]                    PATIENT TYPE:   LOCATION:                                       FACILITY:   PHYSICIAN:  Anselmo Rod, M.D.               DATE OF BIRTH:  05-26-1932   DATE OF PROCEDURE:  01/04/2002  DATE OF DISCHARGE:                                 OPERATIVE REPORT   PROCEDURE:  Screening colonoscopy.   ENDOSCOPIST:  Anselmo Rod, M.D.   INSTRUMENT USED:  Olympus video colonoscope.   INDICATIONS FOR PROCEDURE:  A 77 year old white female with a history of  acute diverticulitis in the past undergoing screening colonoscopy to rule  out chronic polyps, masses, hemorrhoids, etc.   PREPROCEDURE PREPARATION:  Informed consent was procured from the patient.  The patient was fasted for eight hours prior to the procedure and prepped  with a bottle of magnesium citrate and a gallon of NuLytely the night prior  to the procedure.  The patient could not receive more than half a gallon of  NuLytely because of nausea and vomiting after she drank the first half  gallon of NuLytely.   PREPROCEDURE PHYSICAL:  VITAL SIGNS:  Stable.  NECK:  Supple.  CHEST:  Clear to auscultation.  S1 and S2 regular.  ABDOMEN:  Soft with normal bowel sounds.   DESCRIPTION OF PROCEDURE:  The patient was placed in the left lateral  decubitus position and sedated with 50 mg of Demerol and 5 mg of Versed  intravenously.  Once the patient was adequately sedated and maintained on  low flow oxygen and continuous cardiac monitoring, the Olympus video  colonoscopy was advanced from the rectum to the cecum with difficulty  because there was a large amount of stool in the colon.  There was solid  stool seen in the rectum and rectosigmoid area. Multiple washings were done.  The scope was advanced slowly through the colon.  The appendiceal orifice  and the ileocecal valve were identified.  No masses, polyps,  erosions, or  ulcerations were noted. There was evidence of pandiverticulosis with  prominent internal hemorrhoids seen on retroflexion.   IMPRESSION:  1. Prominent nonbleeding internal hemorrhoids seen on retroflexion.  2. Pandiverticular disease.  3. No masses or polyps seen.  4. Large amount of residual stool in the colon, very small lesions could     have been missed.   RECOMMENDATIONS:  1. Avoid nuts, seeds, and popcorn in the diet.  2. High fiber diet.  3.     Repeat colorectal cancer screening in the next 5 to 10 years unless the     patient develops any abnormal symptoms in the interim.  4. Outpatient follow-up in the next two weeks for further recommendations.  Anselmo Rod, M.D.    JNM/MEDQ  D:  01/04/2002  T:  01/04/2002  Job:  [id number]   cc:   Nyoka Cowden, M.D., [facility]   Adolph Pollack, M.D.  Fax: [phone]

## 2010-11-20 LAB — DIFFERENTIAL
Basophils Relative: 1
Eosinophils Absolute: 0.2
Eosinophils Relative: 3
Monocytes Relative: 10
Neutrophils Relative %: 52

## 2010-11-20 LAB — COMPREHENSIVE METABOLIC PANEL
ALT: 18
AST: 23
AST: 107 — ABNORMAL HIGH
AST: 62 — ABNORMAL HIGH
Albumin: 3.1 — ABNORMAL LOW
Alkaline Phosphatase: 66
Alkaline Phosphatase: 92
BUN: 12
BUN: 12
CO2: 29
CO2: 30
Calcium: 9
Chloride: 100
Chloride: 103
Creatinine, Ser: 0.92
Creatinine, Ser: 0.94
GFR calc Af Amer: >60
GFR calc non Af Amer: 56 — ABNORMAL LOW
GFR calc non Af Amer: >60
Glucose, Bld: 92
Potassium: 4.4
Potassium: 3.4 — ABNORMAL LOW
Sodium: 143
Total Bilirubin: 1.3 — ABNORMAL HIGH
Total Bilirubin: 1.7 — ABNORMAL HIGH
Total Protein: 6.5

## 2010-11-20 LAB — TROPONIN I: Troponin I: 0.08 — ABNORMAL HIGH

## 2010-11-20 LAB — BASIC METABOLIC PANEL
BUN: 8
Calcium: 8.5
GFR calc non Af Amer: >60
Potassium: 3.3 — ABNORMAL LOW
Sodium: 143

## 2010-11-20 LAB — CBC
HCT: 31.1 — ABNORMAL LOW
Hemoglobin: 12.4
Hemoglobin: 10.5 — ABNORMAL LOW
MCV: 86.8
MCV: 86.9
Platelets: 236
RBC: 4.36
RBC: 3.58 — ABNORMAL LOW
RDW: 13.9
WBC: 7
WBC: 11.3 — ABNORMAL HIGH
WBC: 14.9 — ABNORMAL HIGH

## 2010-11-20 LAB — B-NATRIURETIC PEPTIDE (CONVERTED LAB): Pro B Natriuretic peptide (BNP): 154 — ABNORMAL HIGH

## 2010-11-20 LAB — CK TOTAL AND CKMB (NOT AT ARMC)
CK, MB: 2.5
Relative Index: 2.3
Total CK: 109

## 2010-11-20 LAB — RAPID STREP SCREEN (MED CTR MEBANE ONLY): Streptococcus, Group A Screen (Direct): NEGATIVE

## 2010-11-20 LAB — TSH: TSH: 4.726 — ABNORMAL HIGH (ref 0.350–4.500)

## 2010-11-20 LAB — AMYLASE: Amylase: 32

## 2011-04-08 ENCOUNTER — Other Ambulatory Visit: Payer: Self-pay | Admitting: Neurosurgery

## 2011-04-17 ENCOUNTER — Encounter (HOSPITAL_COMMUNITY): Payer: Self-pay | Admitting: Pharmacy Technician

## 2011-04-30 ENCOUNTER — Encounter (HOSPITAL_COMMUNITY)
Admission: RE | Admit: 2011-04-30 | Discharge: 2011-04-30 | Disposition: A | Discharge status: Home / Self Care | Payer: Medicare Other | Source: Ambulatory Visit | Attending: Anesthesiology | Admitting: Anesthesiology

## 2011-04-30 ENCOUNTER — Encounter (HOSPITAL_COMMUNITY): Payer: Self-pay

## 2011-04-30 ENCOUNTER — Encounter (HOSPITAL_COMMUNITY)
Admission: RE | Admit: 2011-04-30 | Discharge: 2011-04-30 | Disposition: A | Discharge status: Home / Self Care | Payer: Medicare Other | Source: Ambulatory Visit | Attending: Neurosurgery | Admitting: Neurosurgery

## 2011-04-30 HISTORY — DX: Unspecified osteoarthritis, unspecified site: M19.90

## 2011-04-30 HISTORY — DX: Shortness of breath: R06.02

## 2011-04-30 HISTORY — DX: Other specified postprocedural states: Z98.890

## 2011-04-30 HISTORY — DX: Depression, unspecified: F32.A

## 2011-04-30 HISTORY — DX: Personal history of other diseases of the digestive system: Z87.19

## 2011-04-30 HISTORY — DX: Gastro-esophageal reflux disease without esophagitis: K21.9

## 2011-04-30 HISTORY — DX: Major depressive disorder, single episode, unspecified: F32.9

## 2011-04-30 HISTORY — DX: Nausea with vomiting, unspecified: R11.2

## 2011-04-30 LAB — SURGICAL PCR SCREEN
MRSA, PCR: NEGATIVE
Staphylococcus aureus: NEGATIVE

## 2011-04-30 LAB — CBC
Hemoglobin: 11.9 g/dL — ABNORMAL LOW (ref 12.0–15.0)
MCHC: 31.6 g/dL (ref 30.0–36.0)
Platelets: 316 10*3/uL (ref 150–400)

## 2011-04-30 NOTE — Progress Notes (Signed)
REQUESTED LAST EKG, ECHO, STRESS TEST, OFFICE VISIT FROM [facility] IN [place] ON [address]

## 2011-04-30 NOTE — Pre-Procedure Instructions (Signed)
20 Brenda Goodman  04/30/2011   Your procedure is scheduled on:   Monday  05/04/11   Report to [facility] at 530 AM.  Call this number if you have problems the morning of surgery: [phone]   Remember:   Do not eat food:After Midnight.  May have clear liquids: up to 4 Hours before arrival.  Clear liquids include soda, tea, black coffee, apple or grape juice, broth.  Take these medicines the morning of surgery with A SIP OF WATER:  PROZAC, SYNTHROID, PRILOSEC, ULTRAM  (STOP  ASPIRIN, COUMADIN ,PLAVIX, EFFIENT, HERBAL MEDICINES)   Do not wear jewelry, make-up or nail polish.  Do not wear lotions, powders, or perfumes. You may wear deodorant.  Do not shave 48 hours prior to surgery.  Do not bring valuables to the hospital.  Contacts, dentures or bridgework may not be worn into surgery.  Leave suitcase in the car. After surgery it may be brought to your room.  For patients admitted to the hospital, checkout time is 11:00 AM the day of discharge.   Patients discharged the day of surgery will not be allowed to drive home.  Name and phone number of your driver:   Special Instructions: CHG Shower Use Special Wash: 1/2 bottle night before surgery and 1/2 bottle morning of surgery.   Please read over the following fact sheets that you were given: Pain Booklet, MRSA Information and Surgical Site Infection Prevention

## 2011-05-03 MED ORDER — CEFAZOLIN SODIUM 1-5 GM-% IV SOLN
1.0000 g | INTRAVENOUS | Status: AC
  Administered 2011-05-04: 1 g via INTRAVENOUS
  Filled 2011-05-03: qty 50

## 2011-05-04 ENCOUNTER — Encounter (HOSPITAL_COMMUNITY): Payer: Self-pay | Admitting: Certified Registered"

## 2011-05-04 ENCOUNTER — Inpatient Hospital Stay (HOSPITAL_COMMUNITY)
Admission: RE | Admit: 2011-05-04 | Discharge: 2011-05-05 | DRG: 491 | Disposition: A | Discharge status: Home / Self Care | Payer: Medicare Other | Source: Ambulatory Visit | Attending: Neurosurgery | Admitting: Neurosurgery

## 2011-05-04 ENCOUNTER — Inpatient Hospital Stay (HOSPITAL_COMMUNITY): Payer: Medicare Other

## 2011-05-04 ENCOUNTER — Encounter (HOSPITAL_COMMUNITY): Payer: Self-pay | Admitting: *Deleted

## 2011-05-04 ENCOUNTER — Inpatient Hospital Stay (HOSPITAL_COMMUNITY): Payer: Medicare Other | Admitting: Certified Registered"

## 2011-05-04 ENCOUNTER — Encounter (HOSPITAL_COMMUNITY)
Admission: RE | Disposition: A | Discharge status: Home / Self Care | Payer: Self-pay | Source: Ambulatory Visit | Attending: Neurosurgery

## 2011-05-04 DIAGNOSIS — M48062 Spinal stenosis, lumbar region with neurogenic claudication: Principal | ICD-10-CM | POA: Diagnosis present

## 2011-05-04 DIAGNOSIS — Z862 Personal history of diseases of the blood and blood-forming organs and certain disorders involving the immune mechanism: Secondary | ICD-10-CM

## 2011-05-04 DIAGNOSIS — M545 Low back pain, unspecified: Secondary | ICD-10-CM | POA: Diagnosis present

## 2011-05-04 DIAGNOSIS — Z8639 Personal history of other endocrine, nutritional and metabolic disease: Secondary | ICD-10-CM

## 2011-05-04 HISTORY — PX: LUMBAR LAMINECTOMY/DECOMPRESSION MICRODISCECTOMY: SHX5026

## 2011-05-04 SURGERY — LUMBAR LAMINECTOMY/DECOMPRESSION MICRODISCECTOMY 1 LEVEL
Anesthesia: General | Wound class: Clean

## 2011-05-04 MED ORDER — SCOPOLAMINE 1 MG/3DAYS TD PT72
MEDICATED_PATCH | TRANSDERMAL | Status: DC | PRN
  Administered 2011-05-04: 1 via TRANSDERMAL

## 2011-05-04 MED ORDER — BACITRACIN ZINC 500 UNIT/GM EX OINT
TOPICAL_OINTMENT | CUTANEOUS | Status: DC | PRN
  Administered 2011-05-04: 1 via TOPICAL

## 2011-05-04 MED ORDER — NEOSTIGMINE METHYLSULFATE 1 MG/ML IJ SOLN
INTRAMUSCULAR | Status: DC | PRN
  Administered 2011-05-04: 4 mg via INTRAVENOUS

## 2011-05-04 MED ORDER — ACETAMINOPHEN 650 MG RE SUPP: 650.0000 mg | RECTAL | Status: DC | PRN

## 2011-05-04 MED ORDER — DROPERIDOL 2.5 MG/ML IJ SOLN
INTRAMUSCULAR | Status: DC | PRN
  Administered 2011-05-04: .625 mg via INTRAVENOUS

## 2011-05-04 MED ORDER — GLYCOPYRROLATE 0.2 MG/ML IJ SOLN
INTRAMUSCULAR | Status: DC | PRN
  Administered 2011-05-04: .6 mg via INTRAVENOUS

## 2011-05-04 MED ORDER — PANTOPRAZOLE SODIUM 40 MG PO TBEC: 40.0000 mg | DELAYED_RELEASE_TABLET | Freq: Every day | ORAL | Status: DC

## 2011-05-04 MED ORDER — BUPIVACAINE LIPOSOME 1.3 % IJ SUSP
20.0000 mL | Freq: Once | INTRAMUSCULAR | Status: DC
  Filled 2011-05-04: qty 20

## 2011-05-04 MED ORDER — SODIUM CHLORIDE 0.9 % IV SOLN
INTRAVENOUS | Status: AC
  Filled 2011-05-04: qty 500

## 2011-05-04 MED ORDER — BACITRACIN 50000 UNITS IM SOLR
INTRAMUSCULAR | Status: AC
  Filled 2011-05-04: qty 1

## 2011-05-04 MED ORDER — EPHEDRINE SULFATE 50 MG/ML IJ SOLN
INTRAMUSCULAR | Status: DC | PRN
  Administered 2011-05-04: 10 mg via INTRAVENOUS
  Administered 2011-05-04 (×2): 15 mg via INTRAVENOUS
  Administered 2011-05-04: 10 mg via INTRAVENOUS

## 2011-05-04 MED ORDER — PROPOFOL 10 MG/ML IV EMUL
INTRAVENOUS | Status: DC | PRN
  Administered 2011-05-04: 150 mg via INTRAVENOUS

## 2011-05-04 MED ORDER — ONDANSETRON HCL 4 MG/2ML IJ SOLN
INTRAMUSCULAR | Status: DC | PRN
  Administered 2011-05-04: 4 mg via INTRAVENOUS

## 2011-05-04 MED ORDER — HYDROMORPHONE HCL PF 1 MG/ML IJ SOLN
0.2500 mg | INTRAMUSCULAR | Status: DC | PRN
Start: 2011-05-04 — End: 2011-05-04

## 2011-05-04 MED ORDER — COLESTIPOL HCL 1 G PO TABS
2.0000 g | ORAL_TABLET | Freq: Two times a day (BID) | ORAL | Status: DC
  Filled 2011-05-04 (×3): qty 2

## 2011-05-04 MED ORDER — PHENOL 1.4 % MT LIQD: 1.0000 | OROMUCOSAL | Status: DC | PRN

## 2011-05-04 MED ORDER — LEVOTHYROXINE SODIUM 100 MCG PO TABS
100.0000 ug | ORAL_TABLET | Freq: Every day | ORAL | Status: DC
  Filled 2011-05-04: qty 1

## 2011-05-04 MED ORDER — ZOLPIDEM TARTRATE 5 MG PO TABS: 5.0000 mg | ORAL_TABLET | Freq: Every evening | ORAL | Status: DC | PRN

## 2011-05-04 MED ORDER — ACETAMINOPHEN 325 MG PO TABS: 650.0000 mg | ORAL_TABLET | ORAL | Status: DC | PRN

## 2011-05-04 MED ORDER — HYDROCODONE-ACETAMINOPHEN 5-325 MG PO TABS: 1.0000 | ORAL_TABLET | ORAL | Status: DC | PRN

## 2011-05-04 MED ORDER — SUFENTANIL CITRATE 50 MCG/ML IV SOLN
INTRAVENOUS | Status: DC | PRN
  Administered 2011-05-04: 5 ug via INTRAVENOUS
  Administered 2011-05-04: 10 ug via INTRAVENOUS
  Administered 2011-05-04: 5 ug via INTRAVENOUS
  Administered 2011-05-04: 20 ug via INTRAVENOUS

## 2011-05-04 MED ORDER — BUPIVACAINE-EPINEPHRINE PF 0.5-1:200000 % IJ SOLN
INTRAMUSCULAR | Status: DC | PRN
  Administered 2011-05-04: 10 mL

## 2011-05-04 MED ORDER — DIAZEPAM 5 MG PO TABS: 5.0000 mg | ORAL_TABLET | Freq: Four times a day (QID) | ORAL | Status: DC | PRN

## 2011-05-04 MED ORDER — MORPHINE SULFATE 4 MG/ML IJ SOLN: 1.0000 mg | INTRAMUSCULAR | Status: DC | PRN

## 2011-05-04 MED ORDER — BUPIVACAINE LIPOSOME 1.3 % IJ SUSP
INTRAMUSCULAR | Status: DC | PRN
  Administered 2011-05-04: 20 mL

## 2011-05-04 MED ORDER — ROCURONIUM BROMIDE 100 MG/10ML IV SOLN
INTRAVENOUS | Status: DC | PRN
  Administered 2011-05-04: 50 mg via INTRAVENOUS

## 2011-05-04 MED ORDER — LIDOCAINE HCL 4 % MT SOLN
OROMUCOSAL | Status: DC | PRN
  Administered 2011-05-04: 4 mL via TOPICAL

## 2011-05-04 MED ORDER — DOCUSATE SODIUM 100 MG PO CAPS
100.0000 mg | ORAL_CAPSULE | Freq: Two times a day (BID) | ORAL | Status: DC
  Administered 2011-05-04: 100 mg via ORAL
  Filled 2011-05-04: qty 1

## 2011-05-04 MED ORDER — 0.9 % SODIUM CHLORIDE (POUR BTL) OPTIME
TOPICAL | Status: DC | PRN
  Administered 2011-05-04: 1000 mL

## 2011-05-04 MED ORDER — THROMBIN 5000 UNITS EX SOLR
CUTANEOUS | Status: DC | PRN
  Administered 2011-05-04 (×2): 5000 [IU] via TOPICAL

## 2011-05-04 MED ORDER — ONDANSETRON HCL 4 MG/2ML IJ SOLN: 4.0000 mg | Freq: Once | INTRAMUSCULAR | Status: DC | PRN

## 2011-05-04 MED ORDER — DEXAMETHASONE SODIUM PHOSPHATE 4 MG/ML IJ SOLN
INTRAMUSCULAR | Status: DC | PRN
  Administered 2011-05-04: 4 mg via INTRAVENOUS

## 2011-05-04 MED ORDER — OXYCODONE-ACETAMINOPHEN 5-325 MG PO TABS
1.0000 | ORAL_TABLET | ORAL | Status: DC | PRN
  Administered 2011-05-04: 1 via ORAL
  Administered 2011-05-05: 2 via ORAL
  Administered 2011-05-05: 1 via ORAL
  Filled 2011-05-04 (×2): qty 1
  Filled 2011-05-04: qty 2

## 2011-05-04 MED ORDER — LACTATED RINGERS IV SOLN
INTRAVENOUS | Status: DC | PRN
  Administered 2011-05-04: 07:00:00 via INTRAVENOUS

## 2011-05-04 MED ORDER — MENTHOL 3 MG MT LOZG: 1.0000 | LOZENGE | OROMUCOSAL | Status: DC | PRN

## 2011-05-04 MED ORDER — CEFAZOLIN SODIUM 1-5 GM-% IV SOLN
1.0000 g | Freq: Three times a day (TID) | INTRAVENOUS | Status: AC
  Administered 2011-05-04 – 2011-05-05 (×2): 1 g via INTRAVENOUS
  Filled 2011-05-04 (×2): qty 50

## 2011-05-04 MED ORDER — FLUOXETINE HCL 20 MG PO CAPS
20.0000 mg | ORAL_CAPSULE | Freq: Every day | ORAL | Status: DC
  Filled 2011-05-04: qty 1

## 2011-05-04 MED ORDER — LIDOCAINE HCL (CARDIAC) 20 MG/ML IV SOLN
INTRAVENOUS | Status: DC | PRN
  Administered 2011-05-04: 5 mg via INTRAVENOUS
  Administered 2011-05-04: 60 mg via INTRAVENOUS

## 2011-05-04 MED ORDER — ONDANSETRON HCL 4 MG/2ML IJ SOLN: 4.0000 mg | INTRAMUSCULAR | Status: DC | PRN

## 2011-05-04 MED ORDER — LACTATED RINGERS IV SOLN: INTRAVENOUS | Status: DC

## 2011-05-04 MED ORDER — SODIUM CHLORIDE 0.9 % IR SOLN
Status: DC | PRN
  Administered 2011-05-04: 07:00:00

## 2011-05-04 MED ORDER — HEMOSTATIC AGENTS (NO CHARGE) OPTIME
TOPICAL | Status: DC | PRN
  Administered 2011-05-04: 1 via TOPICAL

## 2011-05-04 SURGICAL SUPPLY — 56 items
APL SKNCLS STERI-STRIP NONHPOA (GAUZE/BANDAGES/DRESSINGS) ×1
BAG DECANTER FOR FLEXI CONT (MISCELLANEOUS) ×2 IMPLANT
BENZOIN TINCTURE PRP APPL 2/3 (GAUZE/BANDAGES/DRESSINGS) ×2 IMPLANT
BLADE SURG ROTATE 9660 (MISCELLANEOUS) IMPLANT
BRUSH SCRUB EZ PLAIN DRY (MISCELLANEOUS) ×2 IMPLANT
BUR ACORN 6.0 (BURR) ×3 IMPLANT
BUR MATCHSTICK NEURO 3.0 LAGG (BURR) ×2 IMPLANT
CANISTER SUCTION 2500CC (MISCELLANEOUS) ×2 IMPLANT
CLOTH BEACON ORANGE TIMEOUT ST (SAFETY) ×2 IMPLANT
CONT SPEC 4OZ CLIKSEAL STRL BL (MISCELLANEOUS) ×2 IMPLANT
DRAPE LAPAROTOMY 100X72X124 (DRAPES) ×2 IMPLANT
DRAPE MICROSCOPE LEICA (MISCELLANEOUS) ×2 IMPLANT
DRAPE POUCH INSTRU U-SHP 10X18 (DRAPES) ×2 IMPLANT
DRAPE SURG 17X23 STRL (DRAPES) ×8 IMPLANT
ELECT BLADE 4.0 EZ CLEAN MEGAD (MISCELLANEOUS) ×2
ELECT REM PT RETURN 9FT ADLT (ELECTROSURGICAL) ×2
ELECTRODE BLDE 4.0 EZ CLN MEGD (MISCELLANEOUS) ×1 IMPLANT
ELECTRODE REM PT RTRN 9FT ADLT (ELECTROSURGICAL) ×1 IMPLANT
GAUZE SPONGE 4X4 12PLY STRL LF (GAUZE/BANDAGES/DRESSINGS) ×1 IMPLANT
GAUZE SPONGE 4X4 16PLY XRAY LF (GAUZE/BANDAGES/DRESSINGS) IMPLANT
GLOVE BIO SURGEON STRL SZ8.5 (GLOVE) ×2 IMPLANT
GLOVE BIOGEL PI IND STRL 6 (GLOVE) IMPLANT
GLOVE BIOGEL PI INDICATOR 6 (GLOVE) ×1
GLOVE ECLIPSE 7.5 STRL STRAW (GLOVE) ×1 IMPLANT
GLOVE EXAM NITRILE LRG STRL (GLOVE) IMPLANT
GLOVE EXAM NITRILE MD LF STRL (GLOVE) ×1 IMPLANT
GLOVE EXAM NITRILE XL STR (GLOVE) IMPLANT
GLOVE EXAM NITRILE XS STR PU (GLOVE) IMPLANT
GLOVE INDICATOR 8.0 STRL GRN (GLOVE) ×1 IMPLANT
GLOVE OPTIFIT SS 6.5 STRL BRWN (GLOVE) ×2 IMPLANT
GLOVE SS BIOGEL STRL SZ 8 (GLOVE) ×1 IMPLANT
GLOVE SUPERSENSE BIOGEL SZ 8 (GLOVE) ×1
GOWN BRE IMP SLV AUR LG STRL (GOWN DISPOSABLE) ×1 IMPLANT
GOWN BRE IMP SLV AUR XL STRL (GOWN DISPOSABLE) ×3 IMPLANT
GOWN STRL REIN 2XL LVL4 (GOWN DISPOSABLE) IMPLANT
KIT BASIN OR (CUSTOM PROCEDURE TRAY) ×2 IMPLANT
KIT ROOM TURNOVER OR (KITS) ×2 IMPLANT
NDL HYPO 21X1.5 SAFETY (NEEDLE) IMPLANT
NEEDLE HYPO 21X1.5 SAFETY (NEEDLE) ×2 IMPLANT
NEEDLE HYPO 22GX1.5 SAFETY (NEEDLE) ×2 IMPLANT
NS IRRIG 1000ML POUR BTL (IV SOLUTION) ×2 IMPLANT
PACK LAMINECTOMY NEURO (CUSTOM PROCEDURE TRAY) ×2 IMPLANT
PAD ARMBOARD 7.5X6 YLW CONV (MISCELLANEOUS) ×6 IMPLANT
PATTIES SURGICAL .5 X1 (DISPOSABLE) IMPLANT
RUBBERBAND STERILE (MISCELLANEOUS) ×4 IMPLANT
SPONGE GAUZE 4X4 12PLY (GAUZE/BANDAGES/DRESSINGS) ×2 IMPLANT
SPONGE SURGIFOAM ABS GEL SZ50 (HEMOSTASIS) ×2 IMPLANT
STRIP CLOSURE SKIN 1/2X4 (GAUZE/BANDAGES/DRESSINGS) ×2 IMPLANT
SUT VIC AB 1 CT1 18XBRD ANBCTR (SUTURE) ×1 IMPLANT
SUT VIC AB 1 CT1 8-18 (SUTURE) ×4
SUT VIC AB 2-0 CP2 18 (SUTURE) ×3 IMPLANT
SYR 20CC LL (SYRINGE) ×1 IMPLANT
SYR 20ML ECCENTRIC (SYRINGE) ×2 IMPLANT
TOWEL OR 17X24 6PK STRL BLUE (TOWEL DISPOSABLE) ×2 IMPLANT
TOWEL OR 17X26 10 PK STRL BLUE (TOWEL DISPOSABLE) ×2 IMPLANT
WATER STERILE IRR 1000ML POUR (IV SOLUTION) ×2 IMPLANT

## 2011-05-04 NOTE — Progress Notes (Signed)
Patient ID: Brenda Goodman, female   DOB: [birth year], 78 y.o.   MRN: [id number] Subjective:  Patient is alert and pleasant. She looks and feels well.  Objective: Vital signs in last 24 hours: Temp:  [96.3 F (35.7 C)-98.1 F (36.7 C)] 98.1 F (36.7 C) (03/11 1600) Pulse Rate:  [30-92] 74  (03/11 1600) Resp:  [10-21] 16  (03/11 1600) BP: (99-130)/(53-82) 100/58 mmHg (03/11 1600) SpO2:  [91 %-98 %] 92 % (03/11 1600)  Intake/Output from previous day:   Intake/Output this shift: Total I/O In: 1240 [P.O.:240; I.V.:1000] Out: 150 [Blood:150]  Physical exam the patient is alert and oriented. She is moving all 4 extremities well. Her dressing is dry but in tatters. Lab Results: No results found for this basename: WBC:2,HGB:2,HCT:2,PLT:2 in the last 72 hours BMET No results found for this basename: NA:2,K:2,CL:2,CO2:2,GLUCOSE:2,BUN:2,CREATININE:2,CALCIUM:2 in the last 72 hours  Studies/Results: Dg Lumbar Spine 1 View  05/04/2011  *RADIOLOGY REPORT*  Clinical Data: Lumbar disc disease.  LUMBAR SPINE - 1 VIEW  Comparison: CT of the abdomen dated 08/25/2007  Findings: There is an instrument which is at the L2-3 level.  IMPRESSION: Instrument at L2-3.  Original Report Authenticated By: Gwynn Burly, M.D.    Assessment/Plan: The patient is doing well.  LOS: 0 days     Kyron Schlitt D 05/04/2011, 6:01 PM

## 2011-05-04 NOTE — Op Note (Signed)
Brief history: The patient is a 78 year old white female who is complaining of back and left leg pain. She has failed medical management. She was worked up with a lumbar MRI which demonstrates spinal stenosis most prominent at L3-4 and L4-5. I discussed the various treatment options with the patient including surgery. She has weighed the risks, benefits, and alternative surgery decided proceed with at L34 and L4-5 laminectomy.  Preoperative diagnosis: L3-4 and L4-5 spinal stenosis, lumbago, lumbar radiculopathy  Postoperative diagnosis: The same  Procedure: L4 laminectomy with bilateral L3 laminotomies to decompress the bilateral L3, L4 and L5 nerve roots  using micro-dissection  Surgeon: Dr. Delma Officer  Asst.: Dr. Shirlean Kelly  Anesthesia: Gen. endotracheal  Estimated blood loss: 75 cc  Drains: None  Complications: None  Description of procedure: The patient was brought to the operating room by the anesthesia team. General endotracheal anesthesia was induced. The patient was turned to the prone position on the Wilson frame. The patient's lumbosacral region was then prepared with Betadine scrub and Betadine solution. Sterile drapes were applied.  I then injected the area to be incised with Marcaine with epinephrine solution. I then used a scalpel to make a linear midline incision over the L3-4 and L4-5 intervertebral disc space. I then used electrocautery to perform a bilateral sided subperiosteal dissection exposing the spinous process and lamina of L2 down to L5. We obtained intraoperative radiograph to confirm our location. I then inserted the [facility] retractor for exposure.  We then brought the operative microscope into the field. Under its magnification and illumination we completed the microdissection. I used a high-speed drill to perform a laminotomy at L3 and L4 bilaterally. I completed the laminectomy L4 with a Kerrison punch I then used a Kerrison punches to widen the  laminotomy and removed the ligamentum flavum at L3-4 and L4-5. We then used microdissection to free up the thecal sac and the bilateral L3, L4 and L5 nerve root from the epidural tissue. I then used a Kerrison punch to perform a foraminotomy at about the bilateral L3, L4 and L5 nerve root. We then using the nerve root retractor to gently retract the thecal sac and the L4 and L5 nerve root medially. This exposed the intervertebral disc. It is were bulging a bit but there was no significant herniations we therefore did not enter into the intervertebral disc.  I then palpated along the ventral surface of the thecal sac and along exit route of the bilateral L3, L4 and L5 nerve root and noted that the neural structures were well decompressed. This completed the decompression.  We then obtained hemostasis using bipolar electrocautery. We irrigated the wound out with bacitracin solution. We then removed the retractor. We then reapproximated the patient's thoracolumbar fascia with interrupted #1 Vicryl suture. We then reapproximated the patient's subcutaneous tissue with interrupted 3-0 Vicryl suture. We then reapproximated patient's skin with Steri-Strips and benzoin. The was then coated with bacitracin ointment. The drapes were removed. The patient was subsequently returned to the supine position where they were extubated by the anesthesia team. The patient was then transported to the postanesthesia care unit in stable condition. All sponge instrument and needle counts were correct at the end of this case.

## 2011-05-04 NOTE — Transfer of Care (Signed)
Immediate Anesthesia Transfer of Care Note  Patient: Brenda Goodman  Procedure(s) Performed: Procedure(s) (LRB): LUMBAR LAMINECTOMY/DECOMPRESSION MICRODISCECTOMY 1 LEVEL (N/A)  Patient Location: PACU  Anesthesia Type: General  Level of Consciousness: alert , oriented, sedated, patient cooperative and responds to stimulation  Airway & Oxygen Therapy: Patient Spontanous Breathing and Patient connected to nasal cannula oxygen  Post-op Assessment: Report given to PACU RN, Post -op Vital signs reviewed and stable and Patient moving all extremities  Post vital signs: Reviewed and stable  Complications: No apparent anesthesia complications

## 2011-05-04 NOTE — Anesthesia Procedure Notes (Signed)
Procedure Name: Intubation Performed by: Melina Schools Pre-anesthesia Checklist: Patient identified, Emergency Drugs available, Suction available, Patient being monitored and Timeout performed Patient Re-evaluated:Patient Re-evaluated prior to inductionOxygen Delivery Method: Circle system utilized Preoxygenation: Pre-oxygenation with 100% oxygen Intubation Type: Combination inhalational/ intravenous induction Ventilation: Mask ventilation without difficulty Laryngoscope Size: Miller and 2 Grade View: Grade I Tube type: Oral Tube size: 7.0 mm Number of attempts: 1 Airway Equipment and Method: Stylet Placement Confirmation: ETT inserted through vocal cords under direct vision,  positive ETCO2,  CO2 detector and breath sounds checked- equal and bilateral Secured at: 22 cm Tube secured with: Tape Dental Injury: Teeth and Oropharynx as per pre-operative assessment

## 2011-05-04 NOTE — Anesthesia Postprocedure Evaluation (Signed)
  Anesthesia Post-op Note  Patient: Brenda Goodman  Procedure(s) Performed: Procedure(s) (LRB): LUMBAR LAMINECTOMY/DECOMPRESSION MICRODISCECTOMY 1 LEVEL (N/A)  Patient Location: PACU  Anesthesia Type: General  Level of Consciousness: awake, alert , oriented and patient cooperative  Airway and Oxygen Therapy: Patient Spontanous Breathing and Patient connected to nasal cannula oxygen  Post-op Pain: mild  Post-op Assessment: Post-op Vital signs reviewed, Patient's Cardiovascular Status Stable, Respiratory Function Stable, Patent Airway, No signs of Nausea or vomiting and Pain level controlled  Post-op Vital Signs: stable  Complications: No apparent anesthesia complications

## 2011-05-04 NOTE — Progress Notes (Signed)
Subjective:  The patient is alert and pleasant. She looks well.  Objective: Vital signs in last 24 hours: Temp:  [96.3 F (35.7 C)-98.1 F (36.7 C)] 96.3 F (35.7 C) (03/11 0930) Pulse Rate:  [30-92] 85  (03/11 1000) Resp:  [10-20] 10  (03/11 1000) BP: (116-130)/(64-75) 130/64 mmHg (03/11 0952) SpO2:  [94 %-98 %] 94 % (03/11 1000)  Intake/Output from previous day:   Intake/Output this shift: Total I/O In: 1000 [I.V.:1000] Out: 150 [Blood:150]  Physical exam the patient is alert and pleasant. She is moving all 4 extremities well.  Lab Results: No results found for this basename: WBC:2,HGB:2,HCT:2,PLT:2 in the last 72 hours BMET No results found for this basename: NA:2,K:2,CL:2,CO2:2,GLUCOSE:2,BUN:2,CREATININE:2,CALCIUM:2 in the last 72 hours  Studies/Results: Dg Lumbar Spine 1 View  05/04/2011  *RADIOLOGY REPORT*  Clinical Data: Lumbar disc disease.  LUMBAR SPINE - 1 VIEW  Comparison: CT of the abdomen dated 08/25/2007  Findings: There is an instrument which is at the L2-3 level.  IMPRESSION: Instrument at L2-3.  Original Report Authenticated By: Gwynn Burly, M.D.    Assessment/Plan: Patient is doing well  LOS: 0 days     Rani Idler D 05/04/2011, 10:05 AM

## 2011-05-04 NOTE — Anesthesia Preprocedure Evaluation (Addendum)
Anesthesia Evaluation  Patient identified by MRN, date of birth, ID band Patient awake    Reviewed: Allergy & Precautions, H&P , NPO status , Patient's Chart, lab work & pertinent test results  History of Anesthesia Complications (+) PONV  Airway Mallampati: I TM Distance: >3 FB Neck ROM: full    Dental   Pulmonary neg pulmonary ROS, shortness of breath,          Cardiovascular negative cardio ROS  Rhythm:regular Rate:Normal     Neuro/Psych PSYCHIATRIC DISORDERS Depression negative neurological ROS     GI/Hepatic Neg liver ROS, hiatal hernia, GERD-  Medicated and Controlled,  Endo/Other  negative endocrine ROSHypothyroidism   Renal/GU negative Renal ROS  negative genitourinary   Musculoskeletal negative musculoskeletal ROS (+)   Abdominal   Peds  Hematology negative hematology ROS (+)   Anesthesia Other Findings   Reproductive/Obstetrics negative OB ROS                          Anesthesia Physical Anesthesia Plan  ASA: II  Anesthesia Plan: General and General ETT   Post-op Pain Management:    Induction: Intravenous and Cricoid pressure planned  Airway Management Planned: Oral ETT  Additional Equipment:   Intra-op Plan:   Post-operative Plan: Extubation in OR  Informed Consent: I have reviewed the patients History and Physical, chart, labs and discussed the procedure including the risks, benefits and alternatives for the proposed anesthesia with the patient or authorized representative who has indicated his/her understanding and acceptance.   Dental advisory given  Plan Discussed with: CRNA, Anesthesiologist and Surgeon  Anesthesia Plan Comments:        Anesthesia Quick Evaluation

## 2011-05-04 NOTE — H&P (Signed)
Subjective: The patient is a 78 year old white female who's had a chronic history of back and leg pain. She complains of left leg pain more than right. She has failed medical management. She was worked up with a lumbar MRI which demonstrated spinal stenosis at L3-4 and L4-5. I discussed the various treatment with her including surgery. She has decided proceed with the operation.   Past Medical History  Diagnosis Date  . PONV (postoperative nausea and vomiting)   . H/O hiatal hernia   . Shortness of breath     COUGH   . GERD (gastroesophageal reflux disease)   . Arthritis   . Depression     Past Surgical History  Procedure Date  . Thyroid lobectomy      APPROX 4 YRS AGO   . Cholecystectomy   . No past surgeries     RIGHT ARM SURGERY BALL + PIN   . Hernia repair     2012  . Cardiovascular stress test     2012   [facility] [place]     . Tonsillectomy     No Known Allergies  History  Substance Use Topics  . Smoking status: Not on file  . Smokeless tobacco: Not on file  . Alcohol Use: No    History reviewed. No pertinent family history. Prior to Admission medications   Medication Sig Start Date End Date Taking? Authorizing Provider  Biotin 7500 MCG TABS Take 1 tablet by mouth daily.   Yes Historical Provider, MD  calcium-vitamin D (OSCAL 500/200 D-3) 500-200 MG-UNIT per tablet Take 1 tablet by mouth 2 (two) times daily.   Yes Historical Provider, MD  cholecalciferol (VITAMIN D) 1000 UNITS tablet Take 1,000 Units by mouth daily.   Yes Historical Provider, MD  colestipol (COLESTID) 1 G tablet Take 2 g by mouth 2 (two) times daily.   Yes Historical Provider, MD  FLUoxetine (PROZAC) 20 MG capsule Take 20 mg by mouth daily.   Yes Historical Provider, MD  levothyroxine (SYNTHROID, LEVOTHROID) 100 MCG tablet Take 100 mcg by mouth daily.   Yes Historical Provider, MD  methocarbamol (ROBAXIN) 500 MG tablet Take 500 mg by mouth every 6 (six) hours as needed. For muscle spasms    Yes Historical Provider, MD  omeprazole (PRILOSEC) 20 MG capsule Take 20 mg by mouth daily.   Yes Historical Provider, MD  traMADol (ULTRAM) 50 MG tablet Take 50-100 mg by mouth every 4 (four) hours as needed. For pain   Yes Historical Provider, MD     Review of Systems  Positive ROS: As above  All other systems have been reviewed and were otherwise negative with the exception of those mentioned in the HPI and as above.  Objective: Vital signs in last 24 hours: Temp:  [98.1 F (36.7 C)] 98.1 F (36.7 C) (03/11 0615) Pulse Rate:  [79] 79  (03/11 0615) Resp:  [18] 18  (03/11 0615) BP: (116)/(73) 116/73 mmHg (03/11 0615) SpO2:  [98 %] 98 % (03/11 0615)  General Appearance: Alert, cooperative, no distress, appears stated age Head: Normocephalic, without obvious abnormality, atraumatic Eyes: PERRL, conjunctiva/corneas clear, EOM's intact, fundi benign, both eyes      Ears: Normal TM's and external ear canals, both ears Throat: Lips, mucosa, and tongue normal; teeth and gums normal Neck: Supple, symmetrical, trachea midline, no adenopathy; thyroid: No enlargement/tenderness/nodules; no carotid bruit or JVD Back: Symmetric, no curvature, ROM normal, no CVA tenderness Lungs: Clear to auscultation bilaterally, respirations unlabored Heart: Regular rate and rhythm,  S1 and S2 normal, no murmur, rub or gallop Abdomen: Soft, non-tender, bowel sounds active all four quadrants, no masses, no organomegaly Extremities: Extremities normal, atraumatic, no cyanosis or edema Pulses: 2+ and symmetric all extremities Skin: Skin color, texture, turgor normal, no rashes or lesions  NEUROLOGIC:   Mental status: alert and oriented, no aphasia, good attention span, Fund of knowledge/ memory ok Motor Exam - grossly normal Sensory Exam - grossly normal Reflexes:  Coordination - grossly normal Gait - grossly normal Balance - grossly normal Cranial Nerves: I: smell Not tested  II: visual acuity  OS:  Normal    OD: Normal   II: visual fields Full to confrontation  II: pupils Equal, round, reactive to light  III,VII: ptosis None  III,IV,VI: extraocular muscles  Full ROM  V: mastication Normal  V: facial light touch sensation  Normal  V,VII: corneal reflex  Present  VII: facial muscle function - upper  Normal  VII: facial muscle function - lower Normal  VIII: hearing Not tested  IX: soft palate elevation  Normal  IX,X: gag reflex Present  XI: trapezius strength  5/5  XI: sternocleidomastoid strength 5/5  XI: neck flexion strength  5/5  XII: tongue strength  Normal    Data Review Lab Results  Component Value Date   WBC 7.9 04/30/2011   HGB 11.9* 04/30/2011   HCT 37.6 04/30/2011   MCV 87.2 04/30/2011   PLT 316 04/30/2011   Lab Results  Component Value Date   NA 137 08/07/2009   K 4.1 08/07/2009   CL 103 08/07/2009   CO2 27 08/07/2009   BUN 8 08/07/2009   CREATININE 1.01 08/07/2009   GLUCOSE 120* 08/07/2009   Lab Results  Component Value Date   INR 0.97 12/20/2008    Assessment/Plan: L3-4 and L4-5 spinal stenosis, lumbago, lumbar radiculopathy: I discussed situation with the patient. I reviewed her MR scan with her. We've discussed the various treatment options including surgery. I've shown her surgical models. I described the surgical option of L3 and L4 laminectomy. We discussed the risks, benefits, alternatives, and the likelihood of achieving our goals with surgery. I have answered all her questions. She wants to proceed with the operation.   Dareion Kneece D 05/04/2011 7:26 AM

## 2011-05-05 MED ORDER — OXYCODONE-ACETAMINOPHEN 5-325 MG PO TABS: 1.0000 | ORAL_TABLET | ORAL | Status: AC | PRN

## 2011-05-05 MED ORDER — DSS 100 MG PO CAPS: 100.0000 mg | ORAL_CAPSULE | Freq: Two times a day (BID) | ORAL | Status: AC

## 2011-05-05 NOTE — Discharge Summary (Signed)
Physician Discharge Summary  Patient ID: Brenda Goodman MRN: [id number] DOB/AGE: Dec 11, 1932 78 y.o.  Admit date: 05/04/2011 Discharge date: 05/05/2011  Admission Diagnoses: Lumbar spinal stenosis with neurogenic claudication, lumbago, lumbar radiculopathy  Discharge Diagnoses: The same Active Problems:  * No active hospital problems. *    Discharged Condition: good  Hospital Course: I admitted the patient to [facility] on 05/04/2011. On that day performed at L 3 and L4 laminectomy. The surgery went well. The patient's postoperative course was unremarkable. She was requesting discharge home on postop day #1.  Consults: None Significant Diagnostic Studies: None Treatments: L3 and L4 laminectomy to decompress the bilateral L3, L4 and L5 nerve roots using microdissection Discharge Exam: Blood pressure 94/42, pulse 72, temperature 98.8 F (37.1 C), temperature source Oral, resp. rate 18, SpO2 94.00%. The patient is alert and oriented. She is pleasant and looks well. Her dressing is clean and dry. Her lower extremity motor strength is normal.  Disposition: Home  Discharge Orders    Future Orders Please Complete By Expires   Diet - low sodium heart healthy      Increase activity slowly      Discharge instructions      Comments:   Call [phone] for a followup appointment.   Remove dressing in 48 hours      Call MD for:  temperature >100.4      Call MD for:  persistant nausea and vomiting      Call MD for:  severe uncontrolled pain      Call MD for:  redness, tenderness, or signs of infection (pain, swelling, redness, odor or green/yellow discharge around incision site)      Call MD for:  difficulty breathing, headache or visual disturbances      Call MD for:  hives      Call MD for:  persistant dizziness or light-headedness      Call MD for:  extreme fatigue        Medication List  As of 05/05/2011  8:22 AM   STOP taking these medications         traMADol 50 MG  tablet         TAKE these medications         Biotin 7500 MCG Tabs   Take 1 tablet by mouth daily.      cholecalciferol 1000 UNITS tablet   Commonly known as: VITAMIN D   Take 1,000 Units by mouth daily.      colestipol 1 G tablet   Commonly known as: COLESTID   Take 2 g by mouth 2 (two) times daily.      DSS 100 MG Caps   Take 100 mg by mouth 2 (two) times daily.      FLUoxetine 20 MG capsule   Commonly known as: PROZAC   Take 20 mg by mouth daily.      levothyroxine 100 MCG tablet   Commonly known as: SYNTHROID, LEVOTHROID   Take 100 mcg by mouth daily.      methocarbamol 500 MG tablet   Commonly known as: ROBAXIN   Take 500 mg by mouth every 6 (six) hours as needed. For muscle spasms      omeprazole 20 MG capsule   Commonly known as: PRILOSEC   Take 20 mg by mouth daily.      OSCAL 500/200 D-3 500-200 MG-UNIT per tablet   Generic drug: calcium-vitamin D   Take 1 tablet by mouth 2 (two) times daily.  oxyCODONE-acetaminophen 5-325 MG per tablet   Commonly known as: PERCOCET   Take 1-2 tablets by mouth every 4 (four) hours as needed.             SignedCristi Loron 05/05/2011, 8:22 AM

## 2011-05-05 NOTE — Evaluation (Signed)
Physical Therapy Evaluation and Discharge Patient Details Name: Brenda Goodman MRN: [id number] DOB: October 04, 1932 Today's Date: 05/05/2011  Problem List: There is no problem list on file for this patient.   Past Medical History:  Past Medical History  Diagnosis Date  . PONV (postoperative nausea and vomiting)   . H/O hiatal hernia   . Shortness of breath     COUGH   . GERD (gastroesophageal reflux disease)   . Arthritis   . Depression    Past Surgical History:  Past Surgical History  Procedure Date  . Thyroid lobectomy      APPROX 4 YRS AGO   . Cholecystectomy   . No past surgeries     RIGHT ARM SURGERY BALL + PIN   . Hernia repair     2012  . Cardiovascular stress test     2012   [facility] [place]     . Tonsillectomy     PT Assessment/Plan/Recommendation PT Assessment PT Recommendation/Assessment: Patent does not need any further PT services No Skilled PT: All education completed;Patient will have necessary level of assist by caregiver at discharge (MD is discharging pt home today) PT Recommendation Follow Up Recommendations: No PT follow up;Supervision/Assistance - 24 hour Equipment Recommended: Other (comment) (reacher) PT Goals     PT Evaluation Precautions/Restrictions  Precautions Precautions: Back Precaution Booklet Issued: Yes (comment) Required Braces or Orthoses: No Prior Functioning  Home Living Lives With: Spouse Receives Help From:  (none PTA) Type of Home: House Bathroom Toilet: Standard Bathroom Accessibility: No Home Adaptive Equipment: Walker - rolling;Other (comment) (borrowing RW (taught how to adjust to correct height);) Additional Comments: vanity beside toilet Prior Function Level of Independence: Independent with basic ADLs;Independent with gait Vocation: Retired Financial risk analyst Arousal/Alertness: Awake/alert Overall Cognitive Status: Impaired Orientation Level: Oriented X4 Safety/Judgement: Decreased awareness of  safety precautions;Decreased safety judgement for tasks assessed (Up in room alone on arrival, holding onto rolling table) Decreased Safety/Judgement: Decreased awareness of need for assistance Sensation/Coordination   Extremity Assessment RLE Assessment RLE Assessment: Within Functional Limits (grossly WFL) LLE Assessment LLE Assessment: Within Functional Limits (grossly WFL) Mobility (including Balance) Bed Mobility Bed Mobility: No (Verbally/visually (via handout) reviewed; pt deferred actual) Transfers Transfers: Yes Sit to Stand: 3: Mod assist;With upper extremity assist;From bed;From chair/3-in-1 Stand to Sit: 3: Mod assist;With upper extremity assist;To bed;To chair/3-in-1 Ambulation/Gait Ambulation/Gait: Yes Ambulation/Gait Assistance: 4: Min assist Assistive device: Rolling walker Gait Pattern: Step-through pattern;Decreased stride length Stairs: No  Posture/Postural Control Posture/Postural Control: No significant limitations Balance Balance Assessed: No Exercise    End of Session PT - End of Session Equipment Utilized During Treatment:  (none-pt up in room on arrival) Activity Tolerance: Patient limited by pain Patient left: in bed;with call bell in reach (sitting EOB per pt preference (refused recliner or chair)) Nurse Communication: Mobility status for ambulation General Behavior During Session: [facility] for tasks performed  Brenda Goodman 05/05/2011, 2:37 PM Pager [phone]

## 2011-05-05 NOTE — Progress Notes (Signed)
UR COMPLETED  

## 2011-05-05 NOTE — Discharge Instructions (Signed)
Spinal Stenosis Spinal stenosis means the space around your spinal cord and nerve roots is too narrow. The bones around your spinal cord pinch your spinal cord nerves. This pinching causes pain in the back and legs. Your doctor might give you medicines to help. In some cases, surgery is needed. HOME CARE  Do exercises and stretches as told by your doctor. Lean forward while walking. Lie down and bring your knees up to your chest.   Rest, and then slowly go back to your activity.   Do aerobic activities, such as walking, biking, and swimming, as told by your doctor. Try to increase your amount of activity over time.   Lose weight if your doctor recommends it. This reduces the load on your spine.   Apply warm or cold packs for pain as told by your doctor.  GET HELP RIGHT AWAY IF:  Your pain comes back more and more often.   You have pain that goes down your leg, even when you are not standing or walking.   You cannot control when you pee (urinate) or poop (bowel movement).   You lose feeling (numbness) in your legs and it does not come back.   You cannot move your legs.  MAKE SURE YOU:  Understand these instructions.   Will watch your condition.   Will get help right away if you are not doing well or get worse.  Document Released: 06/05/2010 Document Revised: 01/29/2011 Document Reviewed: 06/05/2010 [employer] Patient Information 2012 [employer]. 

## 2011-05-11 ENCOUNTER — Encounter (HOSPITAL_COMMUNITY): Payer: Self-pay | Admitting: Neurosurgery

## 2012-07-20 DIAGNOSIS — Z85828 Personal history of other malignant neoplasm of skin: Secondary | ICD-10-CM

## 2012-07-20 HISTORY — DX: Personal history of other malignant neoplasm of skin: Z85.828

## 2014-02-23 HISTORY — PX: THYROID LOBECTOMY: SHX420

## 2014-10-23 ENCOUNTER — Other Ambulatory Visit: Payer: Self-pay | Admitting: Internal Medicine

## 2014-10-23 DIAGNOSIS — I6523 Occlusion and stenosis of bilateral carotid arteries: Secondary | ICD-10-CM

## 2014-10-25 ENCOUNTER — Ambulatory Visit
Admission: RE | Admit: 2014-10-25 | Discharge: 2014-10-25 | Disposition: A | Discharge status: Home / Self Care | Payer: PPO | Source: Ambulatory Visit | Attending: Internal Medicine | Admitting: Internal Medicine

## 2014-10-25 DIAGNOSIS — J019 Acute sinusitis, unspecified: Secondary | ICD-10-CM | POA: Insufficient documentation

## 2014-10-25 DIAGNOSIS — I6523 Occlusion and stenosis of bilateral carotid arteries: Secondary | ICD-10-CM | POA: Diagnosis present

## 2014-10-25 MED ORDER — IOHEXOL 350 MG/ML SOLN
80.0000 mL | Freq: Once | INTRAVENOUS | Status: AC | PRN
  Administered 2014-10-25: 80 mL via INTRAVENOUS

## 2015-02-26 ENCOUNTER — Ambulatory Visit
Admission: RE | Admit: 2015-02-26 | Discharge: 2015-02-26 | Disposition: A | Discharge status: Home / Self Care | Payer: PPO | Source: Ambulatory Visit | Attending: Ophthalmology | Admitting: Ophthalmology

## 2015-02-26 ENCOUNTER — Ambulatory Visit: Payer: PPO | Admitting: Anesthesiology

## 2015-02-26 ENCOUNTER — Encounter
Admission: RE | Disposition: A | Discharge status: Home / Self Care | Payer: Self-pay | Source: Ambulatory Visit | Attending: Ophthalmology

## 2015-02-26 DIAGNOSIS — D649 Anemia, unspecified: Secondary | ICD-10-CM | POA: Insufficient documentation

## 2015-02-26 DIAGNOSIS — M810 Age-related osteoporosis without current pathological fracture: Secondary | ICD-10-CM | POA: Insufficient documentation

## 2015-02-26 DIAGNOSIS — K449 Diaphragmatic hernia without obstruction or gangrene: Secondary | ICD-10-CM | POA: Diagnosis not present

## 2015-02-26 DIAGNOSIS — K219 Gastro-esophageal reflux disease without esophagitis: Secondary | ICD-10-CM | POA: Diagnosis not present

## 2015-02-26 DIAGNOSIS — Z9049 Acquired absence of other specified parts of digestive tract: Secondary | ICD-10-CM | POA: Diagnosis not present

## 2015-02-26 DIAGNOSIS — F329 Major depressive disorder, single episode, unspecified: Secondary | ICD-10-CM | POA: Diagnosis not present

## 2015-02-26 DIAGNOSIS — R05 Cough: Secondary | ICD-10-CM | POA: Diagnosis not present

## 2015-02-26 DIAGNOSIS — Z85828 Personal history of other malignant neoplasm of skin: Secondary | ICD-10-CM | POA: Diagnosis not present

## 2015-02-26 DIAGNOSIS — M1990 Unspecified osteoarthritis, unspecified site: Secondary | ICD-10-CM | POA: Diagnosis not present

## 2015-02-26 DIAGNOSIS — E890 Postprocedural hypothyroidism: Secondary | ICD-10-CM | POA: Diagnosis not present

## 2015-02-26 DIAGNOSIS — H2511 Age-related nuclear cataract, right eye: Secondary | ICD-10-CM | POA: Diagnosis not present

## 2015-02-26 DIAGNOSIS — R42 Dizziness and giddiness: Secondary | ICD-10-CM | POA: Diagnosis not present

## 2015-02-26 DIAGNOSIS — R062 Wheezing: Secondary | ICD-10-CM | POA: Diagnosis not present

## 2015-02-26 DIAGNOSIS — K5730 Diverticulosis of large intestine without perforation or abscess without bleeding: Secondary | ICD-10-CM | POA: Diagnosis not present

## 2015-02-26 HISTORY — DX: Malignant (primary) neoplasm, unspecified: C80.1

## 2015-02-26 HISTORY — DX: Diverticulosis of intestine, part unspecified, without perforation or abscess without bleeding: K57.90

## 2015-02-26 HISTORY — DX: Dizziness and giddiness: R42

## 2015-02-26 HISTORY — DX: Hypothyroidism, unspecified: E03.9

## 2015-02-26 HISTORY — DX: Anemia, unspecified: D64.9

## 2015-02-26 HISTORY — PX: CATARACT EXTRACTION W/PHACO: SHX586

## 2015-02-26 SURGERY — PHACOEMULSIFICATION, CATARACT, WITH IOL INSERTION
Anesthesia: Monitor Anesthesia Care | Site: Eye | Laterality: Right | Wound class: Clean

## 2015-02-26 MED ORDER — ARMC OPHTHALMIC DILATING GEL
OPHTHALMIC | Status: AC
  Administered 2015-02-26: 1 via OPHTHALMIC
  Filled 2015-02-26: qty 0.25

## 2015-02-26 MED ORDER — CEFUROXIME OPHTHALMIC INJECTION 1 MG/0.1 ML
INJECTION | OPHTHALMIC | Status: AC
  Filled 2015-02-26: qty 0.1

## 2015-02-26 MED ORDER — FENTANYL CITRATE (PF) 100 MCG/2ML IJ SOLN
INTRAMUSCULAR | Status: DC | PRN
  Administered 2015-02-26: 50 ug via INTRAVENOUS

## 2015-02-26 MED ORDER — MOXIFLOXACIN HCL 0.5 % OP SOLN: 1.0000 [drp] | OPHTHALMIC | Status: DC | PRN

## 2015-02-26 MED ORDER — BSS IO SOLN
INTRAOCULAR | Status: DC | PRN
  Administered 2015-02-26: 1 mL via OPHTHALMIC

## 2015-02-26 MED ORDER — TETRACAINE HCL 0.5 % OP SOLN
OPHTHALMIC | Status: AC
  Administered 2015-02-26: 1 [drp] via OPHTHALMIC
  Filled 2015-02-26: qty 2

## 2015-02-26 MED ORDER — NA CHONDROIT SULF-NA HYALURON 40-17 MG/ML IO SOLN
INTRAOCULAR | Status: AC
  Filled 2015-02-26: qty 1

## 2015-02-26 MED ORDER — NA CHONDROIT SULF-NA HYALURON 40-17 MG/ML IO SOLN
INTRAOCULAR | Status: DC | PRN
  Administered 2015-02-26: 1 mL via INTRAOCULAR

## 2015-02-26 MED ORDER — EPINEPHRINE HCL 1 MG/ML IJ SOLN
INTRAMUSCULAR | Status: AC
  Filled 2015-02-26: qty 1

## 2015-02-26 MED ORDER — MOXIFLOXACIN HCL 0.5 % OP SOLN
OPHTHALMIC | Status: AC
  Filled 2015-02-26: qty 3

## 2015-02-26 MED ORDER — CARBACHOL 0.01 % IO SOLN
INTRAOCULAR | Status: DC | PRN
  Administered 2015-02-26: .5 mL via INTRAOCULAR

## 2015-02-26 MED ORDER — CEFUROXIME OPHTHALMIC INJECTION 1 MG/0.1 ML
INJECTION | OPHTHALMIC | Status: DC | PRN
  Administered 2015-02-26: .1 mL via INTRACAMERAL

## 2015-02-26 MED ORDER — ARMC OPHTHALMIC DILATING GEL
1.0000 "application " | OPHTHALMIC | Status: AC | PRN
  Administered 2015-02-26 (×2): 1 via OPHTHALMIC

## 2015-02-26 MED ORDER — TETRACAINE HCL 0.5 % OP SOLN
1.0000 [drp] | OPHTHALMIC | Status: AC | PRN
  Administered 2015-02-26: 1 [drp] via OPHTHALMIC

## 2015-02-26 MED ORDER — SODIUM CHLORIDE 0.9 % IV SOLN
INTRAVENOUS | Status: DC
  Administered 2015-02-26: 07:00:00 via INTRAVENOUS

## 2015-02-26 MED ORDER — POVIDONE-IODINE 5 % OP SOLN
OPHTHALMIC | Status: AC
  Administered 2015-02-26: 1 via OPHTHALMIC
  Filled 2015-02-26: qty 30

## 2015-02-26 MED ORDER — MIDAZOLAM HCL 2 MG/2ML IJ SOLN
INTRAMUSCULAR | Status: DC | PRN
  Administered 2015-02-26: 1 mg via INTRAVENOUS

## 2015-02-26 MED ORDER — POVIDONE-IODINE 5 % OP SOLN
1.0000 "application " | OPHTHALMIC | Status: AC | PRN
  Administered 2015-02-26: 1 via OPHTHALMIC

## 2015-02-26 MED ORDER — MOXIFLOXACIN HCL 0.5 % OP SOLN
OPHTHALMIC | Status: DC | PRN
  Administered 2015-02-26: 1 [drp] via OPHTHALMIC

## 2015-02-26 SURGICAL SUPPLY — 23 items
CANNULA ANT/CHMB 27G (MISCELLANEOUS) ×1 IMPLANT
CANNULA ANT/CHMB 27GA (MISCELLANEOUS) ×3 IMPLANT
CUP MEDICINE 2OZ PLAST GRAD ST (MISCELLANEOUS) ×3 IMPLANT
GLOVE BIO SURGEON STRL SZ8 (GLOVE) ×3 IMPLANT
GLOVE BIOGEL M 6.5 STRL (GLOVE) ×3 IMPLANT
GLOVE SURG LX 8.0 MICRO (GLOVE) ×2
GLOVE SURG LX STRL 8.0 MICRO (GLOVE) ×1 IMPLANT
GOWN STRL REUS W/ TWL LRG LVL3 (GOWN DISPOSABLE) ×2 IMPLANT
GOWN STRL REUS W/TWL LRG LVL3 (GOWN DISPOSABLE) ×6
LENS IOL TECNIS 25.0 (Intraocular Lens) ×3 IMPLANT
LENS IOL TECNIS MONO 1P 25.0 (Intraocular Lens) IMPLANT
PACK CATARACT (MISCELLANEOUS) ×3 IMPLANT
PACK CATARACT BRASINGTON LX (MISCELLANEOUS) ×3 IMPLANT
PACK EYE AFTER SURG (MISCELLANEOUS) ×3 IMPLANT
SOL BSS BAG (MISCELLANEOUS) ×3
SOL PREP PVP 2OZ (MISCELLANEOUS) ×3
SOLUTION BSS BAG (MISCELLANEOUS) ×1 IMPLANT
SOLUTION PREP PVP 2OZ (MISCELLANEOUS) ×1 IMPLANT
SYR 3ML LL SCALE MARK (SYRINGE) ×3 IMPLANT
SYR 5ML LL (SYRINGE) ×3 IMPLANT
SYR TB 1ML 27GX1/2 LL (SYRINGE) ×3 IMPLANT
WATER STERILE IRR 1000ML POUR (IV SOLUTION) ×3 IMPLANT
WIPE NON LINTING 3.25X3.25 (MISCELLANEOUS) ×3 IMPLANT

## 2015-02-26 NOTE — Op Note (Signed)
PREOPERATIVE DIAGNOSIS:  Nuclear sclerotic cataract of the right eye.   POSTOPERATIVE DIAGNOSIS: nuclear sclerotic cataract right eye   OPERATIVE PROCEDURE:  Procedure(s): CATARACT EXTRACTION PHACO AND INTRAOCULAR LENS PLACEMENT (IOC)   SURGEON:  Birder Robson, MD.   ANESTHESIA:  Anesthesiologist: Martha Clan, MD CRNA: Nelda Marseille, CRNA  1.      Managed anesthesia care. 2.      Topical tetracaine drops followed by 2% Xylocaine jelly applied in the preoperative holding area.   COMPLICATIONS:  None.   TECHNIQUE:   Stop and chop   DESCRIPTION OF PROCEDURE:  The patient was examined and consented in the preoperative holding area where the aforementioned topical anesthesia was applied to the right eye and then brought back to the Operating Room where the right eye was prepped and draped in the usual sterile ophthalmic fashion and a lid speculum was placed. A paracentesis was created with the side port blade and the anterior chamber was filled with viscoelastic. A near clear corneal incision was performed with the steel keratome. A continuous curvilinear capsulorrhexis was performed with a cystotome followed by the capsulorrhexis forceps. Hydrodissection and hydrodelineation were carried out with BSS on a blunt cannula. The lens was removed in a stop and chop  technique and the remaining cortical material was removed with the irrigation-aspiration handpiece. The capsular bag was inflated with viscoelastic and the Technis ZCB00  lens was placed in the capsular bag without complication. The remaining viscoelastic was removed from the eye with the irrigation-aspiration handpiece. The wounds were hydrated. The anterior chamber was flushed with Miostat and the eye was inflated to physiologic pressure. 0.1 mL of cefuroxime concentration 10 mg/mL was placed in the anterior chamber. The wounds were found to be water tight. The eye was dressed with Vigamox. The patient was given protective glasses to wear  throughout the day and a shield with which to sleep tonight. The patient was also given drops with which to begin a drop regimen today and will follow-up with me in one day.  Implant Name Type Inv. Item Serial No. Manufacturer Lot No. LRB No. Used  LENS IMPL INTRAOC ZCB00 25.0 - [id number] Intraocular Lens LENS IMPL INTRAOC ZCB00 25.0 [id number]   Right 1   Procedure(s) with comments: CATARACT EXTRACTION PHACO AND INTRAOCULAR LENS PLACEMENT (IOC) (Right) - [place]  02:00 AP% 27.4 CDE 32.96 fluid pack lot # [id number]  Electronically signed: Estes Lehner LOUIS 02/26/2015 8:14 AM

## 2015-02-26 NOTE — Anesthesia Preprocedure Evaluation (Signed)
Anesthesia Evaluation  Patient identified by MRN, date of birth, ID band Patient awake    Reviewed: Allergy & Precautions, H&P , NPO status , Patient's Chart, lab work & pertinent test results, reviewed documented beta blocker date and time   History of Anesthesia Complications (+) PONV and history of anesthetic complications  Airway Mallampati: III  TM Distance: >3 FB Neck ROM: full    Dental no notable dental hx. (+) Caps, Teeth Intact   Pulmonary shortness of breath (with low heart rate), neg sleep apnea, neg COPD, neg recent URI,    Pulmonary exam normal breath sounds clear to auscultation       Cardiovascular Exercise Tolerance: Good (-) hypertension(-) angina(-) CAD, (-) Past MI, (-) Cardiac Stents and (-) CABG Normal cardiovascular exam+ dysrhythmias (bradycardia) (-) Valvular Problems/Murmurs Rhythm:regular Rate:Normal     Neuro/Psych PSYCHIATRIC DISORDERS (Depression) negative neurological ROS     GI/Hepatic Neg liver ROS, hiatal hernia, GERD  Medicated and Controlled,  Endo/Other  neg diabetesHypothyroidism   Renal/GU negative Renal ROS  negative genitourinary   Musculoskeletal   Abdominal   Peds  Hematology  (+) Blood dyscrasia, anemia ,   Anesthesia Other Findings Past Medical History:   PONV (postoperative nausea and vomiting)                     H/O hiatal hernia                                            Shortness of breath                                            Comment:COUGH    GERD (gastroesophageal reflux disease)                       Arthritis                                                    Depression                                                   Dizziness                                                      Comment:possibly cardiac related   Diverticulosis                                               Anemia  Cancer ([place])                                                    Comment:skin   Hypothyroidism                                               Reproductive/Obstetrics negative OB ROS                             Anesthesia Physical Anesthesia Plan  ASA: II  Anesthesia Plan: MAC   Post-op Pain Management:    Induction:   Airway Management Planned:   Additional Equipment:   Intra-op Plan:   Post-operative Plan:   Informed Consent: I have reviewed the patients History and Physical, chart, labs and discussed the procedure including the risks, benefits and alternatives for the proposed anesthesia with the patient or authorized representative who has indicated his/her understanding and acceptance.   Dental Advisory Given  Plan Discussed with: Anesthesiologist, CRNA and Surgeon  Anesthesia Plan Comments:         Anesthesia Quick Evaluation

## 2015-02-26 NOTE — Anesthesia Procedure Notes (Signed)
Procedure Name: MAC Date/Time: 02/26/2015 8:00 AM Performed by: Nelda Marseille Pre-anesthesia Checklist: Patient identified, Emergency Drugs available, Suction available, Patient being monitored and Timeout performed Comments: Blow by with 100% O2 at 10LPM

## 2015-02-26 NOTE — H&P (Signed)
  All labs reviewed. Abnormal studies sent to patients PCP when indicated.  Previous H&P reviewed, patient examined, there are NO CHANGES.  Brenda Goodman LOUIS1/3/20177:47 AM

## 2015-02-26 NOTE — Anesthesia Postprocedure Evaluation (Signed)
Anesthesia Post Note  Patient: Brenda Goodman  Procedure(s) Performed: Procedure(s) (LRB): CATARACT EXTRACTION PHACO AND INTRAOCULAR LENS PLACEMENT (IOC) (Right)  Patient location during evaluation: PACU Anesthesia Type: MAC Level of consciousness: awake, awake and alert and oriented Pain management: pain level controlled Vital Signs Assessment: post-procedure vital signs reviewed and stable Respiratory status: spontaneous breathing, nonlabored ventilation and respiratory function stable Cardiovascular status: blood pressure returned to baseline and stable Postop Assessment: no headache and no backache Anesthetic complications: no    Last Vitals:  Filed Vitals:   02/26/15 0629  BP: 149/72  Pulse: 72  Temp: 36.3 C  Resp: 16    Last Pain: There were no vitals filed for this visit.               Travontae Freiberger,  [employer]

## 2015-02-26 NOTE — Transfer of Care (Signed)
Immediate Anesthesia Transfer of Care Note  Patient: Brenda Goodman  Procedure(s) Performed: Procedure(s) with comments: CATARACT EXTRACTION PHACO AND INTRAOCULAR LENS PLACEMENT (IOC) (Right) - [place]  02:00 AP% 27.4 CDE 32.96 fluid pack lot # [id number]  Patient Location: PACU  Anesthesia Type:MAC  Level of Consciousness: awake, alert  and oriented  Airway & Oxygen Therapy: Patient Spontanous Breathing  Post-op Assessment: Report given to RN and Post -op Vital signs reviewed and stable  Post vital signs: Reviewed and stable  Last Vitals:  Filed Vitals:   02/26/15 0629  BP: 149/72  Pulse: 72  Temp: 36.3 C  Resp: 16    Complications: No apparent anesthesia complications

## 2015-03-13 DIAGNOSIS — H2512 Age-related nuclear cataract, left eye: Secondary | ICD-10-CM | POA: Diagnosis not present

## 2015-03-18 ENCOUNTER — Encounter: Payer: Self-pay | Admitting: *Deleted

## 2015-03-18 NOTE — Pre-Procedure Instructions (Signed)
SEEN BY DR [facility] 12/16 AND NOTE ON CHART

## 2015-03-18 NOTE — Pre-Procedure Instructions (Signed)
Language Race / Ethnicity   [place], [place] [address]  [phone] (Home) [email or web address]  English (Preferred) White / Not Hispanic or Latino  Reason for Visit Reason Comments  8 week follow up dizzy spells got better but have started back since the day after thanksgiving  Encounter Details Date Type Department Care Team Description  01/30/2015 Office Visit [facility]  [place], [place] [address]  [phone]  Sydnee Levans, [place] [employer]  [place],  [address]  [phone]  [phone] (Fax)  Abnormal Holter monitor finding (Primary Dx);Dizziness;Acquired hypothyroidism, unspecified;Intermittent palpitations  Last Filed Vital Signs - in this encounter Vital Sign Reading Time Taken  Blood Pressure 120/74 01/30/2015 1:32 PM EST  Pulse 72 01/30/2015 1:32 PM EST  Temperature - -  Respiratory Rate 12 01/30/2015 1:32 PM EST  Oxygen Saturation - -  Inhaled Oxygen Concentration - -  Weight 67.6 kg (149 lb) 01/30/2015 1:32 PM EST  Height 157.5 cm (5\' 2" ) 01/30/2015 1:32 PM EST  Body Mass Index 27.25 01/30/2015 1:32 PM EST  Progress Notes - in this encounter Sydnee Levans, MD - 01/30/2015 1:30 PM EST Formatting of this note may be different from the original.   Chief Complaint: Chief Complaint  Patient presents with  . 8 week follow up  dizzy spells got better but have started back since the day after thanksgiving  Date of Service: 01/30/2015 Date of Birth: [birth year] PCP: Glendon Axe, MD  History of Present Illness: Brenda Goodman is a 82 y.o.female patient who is returns for follow-up regarding her dizzy spells. They improved for several weeks to almost 2 months but to have returned after Thanksgiving holiday. She denies syncope. She has been compliant with her medications including magnesium, levothyroxine. The she attempts to hydrate daily. She denies orthopnea or PND.  She denies any sustained rapid or irregular heartbeat. Past Medical and Surgical History  Past Medical History Past Medical History  Diagnosis Date  . Anemia  . Chronic cough  secondary to reflux and allergies  . Depression  . Diverticulosis  . Fibrocystic breast disease  . GERD (gastroesophageal reflux disease)  . Hyperlipidemia  . Hypothyroid, unspecified  . Osteoporosis, post-menopausal  a) no biphosphonates because of stricture, b) right shoulder fx, c) right wrist fx  . Spinal stenosis  symptomatic - Dr. Arnoldo Morale   Past Surgical History She has a past surgical history that includes Urethral mucosa cauterization and dilation; Dilation and curettage, diagnostic / therapeutic; Breast biopsy procedure (1987); Nissen fundoplication (A999333); Take down of Nissen fundoplication (99991111); Fracture surgery; Fracture surgery; and Thyroid nodule excision.   Medications and Allergies  Current Medications  Current Outpatient Prescriptions  Medication Sig Dispense Refill  . calcium carbonate-vitamin D3 (CALTRATE 600+D) 600 mg(1,500mg ) -400 unit tablet Take 1 tablet by mouth 2 (two) times daily with meals.  . cholecalciferol (VITAMIN D3) 1,000 unit capsule Take 1,000 Units by mouth once daily.  . colestipol (COLESTID) 1 gram tablet Take 2 tablets (2 g total) by mouth 2 (two) times daily. 360 tablet 3  . FLUoxetine (PROZAC) 20 MG capsule Take 1 capsule (20 mg total) by mouth once daily. 90 capsule 3  . levothyroxine (SYNTHROID, LEVOTHROID) 125 MCG tablet Take 1 tablet (125 mcg total) by mouth once daily. 90 tablet 3  . magnesium oxide (MAG-OX) 400 mg tablet Take 1 tablet (400 mg total) by mouth once daily. 30 tablet 11  . omeprazole (PRILOSEC) 20 MG  DR capsule Take 1 capsule (20 mg total) by mouth once daily. 90 capsule 3  . zoledronic acid (RECLAST) 5 mg/100 mL injection Inject 5 mg into the vein once.   No current facility-administered medications for this visit.   Allergies: Review of  patient's allergies indicates no known allergies.  Social and Family History  Social History reports that she has never smoked. She has never used smokeless tobacco. She reports that she does not drink alcohol or use illicit drugs.  Family History Family History  Problem Relation Age of Onset  . Heart attack Mother  . No Known Problems Father   Review of Systems  Review of Systems  Constitutional: Negative for chills, diaphoresis, fever, malaise/fatigue and weight loss.  HENT: Negative for congestion, ear discharge, hearing loss and tinnitus.  Eyes: Negative for blurred vision.  Respiratory: Positive for shortness of breath. Negative for cough, hemoptysis, sputum production and wheezing.  Cardiovascular: Negative for chest pain, palpitations, orthopnea, claudication, leg swelling and PND.  Gastrointestinal: Negative for abdominal pain, blood in stool, constipation, diarrhea, heartburn, melena, nausea and vomiting.  Genitourinary: Negative for dysuria, frequency, hematuria and urgency.  Musculoskeletal: Negative for back pain, falls, joint pain and myalgias.  Skin: Negative for itching and rash.  Neurological: Positive for dizziness. Negative for focal weakness, loss of consciousness, weakness and headaches.  Endo/Heme/Allergies: Negative for polydipsia. Does not bruise/bleed easily.  Psychiatric/Behavioral: Negative for depression, memory loss and substance abuse. The patient is not nervous/anxious.   Physical Examination   Vitals: Visit Vitals  . BP 120/74 (BP Location: Left upper arm, Patient Position: Sitting, BP Cuff Size: Adult)  . Pulse 72  . Resp 12  . Ht 157.5 cm (5\' 2" )  . Wt 67.6 kg (149 lb)  . BMI 27.25 kg/m2   Ht:157.5 cm (5\' 2" ) Wt:67.6 kg (149 lb) [id number] surface area is 1.72 meters squared. Body mass index is 27.25 kg/(m^2).  Wt Readings from Last 3 Encounters:  01/30/15 67.6 kg (149 lb)  12/11/14 68.9 kg (152 lb)  12/10/14 68.9 kg (152 lb)   BP Readings  from Last 3 Encounters:  01/30/15 120/74  01/22/15 142/80  12/11/14 120/60   General appearance appears in no acute distress  Head Mouth and Eye exam Normocephalic, without obvious abnormality, atraumatic Dentition is good Eyes appear anicteric   Neck exam Thyroid: normal  Nodes: no obvious adenopathy  LUNGS Breath Sounds: Normal Percussion: Normal  CARDIOVASCULAR JVP CV wave: no HJR: no Elevation at 90 degrees: None Carotid Pulse: normal pulsation bilaterally Bruit: carotid bilateral Apex: apical impulse normal  Auscultation Rhythm: sinus arrhythmia S1: normal S2: normal Clicks: no Rub: no Murmurs: 1/6 medium pitched mid systolic blowing at lower left sternal border  Gallop: None ABDOMEN Liver enlargement: no Pulsatile aorta: no Ascites: no Bruits: no  EXTREMITIES Clubbing: no Edema: trace to 1+ bilateral pedal edema Pulses: peripheral pulses symmetrical Femoral Bruits: no Amputation: no SKIN Rash: no Cyanosis: no Embolic phemonenon: no Bruising: no NEURO Alert and Oriented to person, place and time: yes Non focal: yes  PSYCH: Pt appears to have normal affect  LABS REVIEWED Last 3 CBC results: Lab Results  Component Value Date  WBC 8.1 10/04/2014  WBC 7.7 11/27/2013   Lab Results  Component Value Date  HGB 11.3 (L) 10/04/2014  HGB 11.2 (L) 11/27/2013   Lab Results  Component Value Date  HCT 37.5 10/04/2014  HCT 36.0 11/27/2013   Lab Results  Component Value Date  PLT 304 10/04/2014  PLT 311  11/27/2013   Lab Results  Component Value Date  CREATININE 0.9 01/09/2015  BUN 11 10/04/2014  NA 139 10/04/2014  K 4.6 10/04/2014  CL 102 10/04/2014  CO2 28.0 10/04/2014   Lab Results  Component Value Date  HDL 86.5 (H) 10/04/2014  HDL 76.3 11/27/2013   Lab Results  Component Value Date  LDLCALC 99 10/04/2014  LDLCALC 98 11/27/2013   Lab Results  Component Value Date  TRIG 117 10/04/2014  TRIG 85 11/27/2013   Lab Results   Component Value Date  ALT 15 10/04/2014  AST 21 10/04/2014  ALKPHOS 53 10/04/2014   Lab Results  Component Value Date  TSH 4.104 10/04/2014   Assessment and Plan   82 y.o. female with  ICD-10-CM ICD-9-CM  1. Abnormal Holter monitor finding- There are no significant bradycardic episodes during waking hours. She has some bradycardia during sleeping hours with otherwise unremarkable. R94.31 794.31  2. Acquired hypothyroidism-stable E03.9 244.9  3. Dizziness-as per above. Etiology unclear. Does not appear to have any significant tachy or bradyarrhythmias R42 780.4   4. Intermittent palpitations R00.2 785.1  5. Atypical chest pain-etiology of pain is unclear. Functional study showed no evidence of ischemia . R07.89 786.59   6. Nocturnal hypoxia-concern over possible occult sleep apnea. G47.34 327.24   Return in about 3 months (around 04/30/2015).  These notes generated with voice recognition software. I apologize for typographical errors.  Sydnee Levans, MD    Plan of Treatment - as of this encounter Upcoming Encounters Upcoming Encounters  Date Type Specialty Care Team Description  03/27/2015 Appointment Internal Medicine Glendon Axe, [place]  [place], [place] [address]  [phone]  [phone] (Fax)    04/24/2015 Appointment Cardiology Fath, Aloha Gell, MD  [facility]  [place], [place] [address]  [phone]  [phone] (Fax)    Visit Diagnoses - in this encounter Diagnosis  Abnormal Holter monitor finding - Primary  Dizziness  Dizziness and giddiness   Acquired hypothyroidism, unspecified  Intermittent palpitations  Document Information Primary Care Provider Glendon Axe (Apr. 06, 2015 - Present) [phone] (Work) [phone] (Fax)  [place] [facility] [place], [place] [address] Document Coverage Dates Dec. 07, 2016 - Dec. 19,  2016 [place] [phone] (Work) [place], [place] [address] Encounter Providers Sydnee Levans (Attending) [phone] (Work) [phone] (Fax)  [place], [place] [address]

## 2015-03-19 ENCOUNTER — Ambulatory Visit
Admission: RE | Admit: 2015-03-19 | Discharge: 2015-03-19 | Disposition: A | Discharge status: Home / Self Care | Payer: PPO | Source: Ambulatory Visit | Attending: Ophthalmology | Admitting: Ophthalmology

## 2015-03-19 ENCOUNTER — Encounter
Admission: RE | Disposition: A | Discharge status: Home / Self Care | Payer: Self-pay | Source: Ambulatory Visit | Attending: Ophthalmology

## 2015-03-19 ENCOUNTER — Ambulatory Visit: Payer: PPO | Admitting: Anesthesiology

## 2015-03-19 ENCOUNTER — Encounter: Payer: Self-pay | Admitting: Anesthesiology

## 2015-03-19 DIAGNOSIS — E890 Postprocedural hypothyroidism: Secondary | ICD-10-CM | POA: Insufficient documentation

## 2015-03-19 DIAGNOSIS — R0602 Shortness of breath: Secondary | ICD-10-CM | POA: Insufficient documentation

## 2015-03-19 DIAGNOSIS — F329 Major depressive disorder, single episode, unspecified: Secondary | ICD-10-CM | POA: Diagnosis not present

## 2015-03-19 DIAGNOSIS — R05 Cough: Secondary | ICD-10-CM | POA: Diagnosis not present

## 2015-03-19 DIAGNOSIS — R062 Wheezing: Secondary | ICD-10-CM | POA: Insufficient documentation

## 2015-03-19 DIAGNOSIS — H2512 Age-related nuclear cataract, left eye: Secondary | ICD-10-CM | POA: Diagnosis not present

## 2015-03-19 DIAGNOSIS — D649 Anemia, unspecified: Secondary | ICD-10-CM | POA: Insufficient documentation

## 2015-03-19 DIAGNOSIS — K5790 Diverticulosis of intestine, part unspecified, without perforation or abscess without bleeding: Secondary | ICD-10-CM | POA: Insufficient documentation

## 2015-03-19 DIAGNOSIS — K449 Diaphragmatic hernia without obstruction or gangrene: Secondary | ICD-10-CM | POA: Diagnosis not present

## 2015-03-19 DIAGNOSIS — M1990 Unspecified osteoarthritis, unspecified site: Secondary | ICD-10-CM | POA: Insufficient documentation

## 2015-03-19 DIAGNOSIS — R42 Dizziness and giddiness: Secondary | ICD-10-CM | POA: Insufficient documentation

## 2015-03-19 DIAGNOSIS — M810 Age-related osteoporosis without current pathological fracture: Secondary | ICD-10-CM | POA: Insufficient documentation

## 2015-03-19 DIAGNOSIS — Z9049 Acquired absence of other specified parts of digestive tract: Secondary | ICD-10-CM | POA: Diagnosis not present

## 2015-03-19 DIAGNOSIS — Z85828 Personal history of other malignant neoplasm of skin: Secondary | ICD-10-CM | POA: Diagnosis not present

## 2015-03-19 HISTORY — DX: Cough: R05

## 2015-03-19 HISTORY — DX: Cough, unspecified: R05.9

## 2015-03-19 HISTORY — DX: Wheezing: R06.2

## 2015-03-19 HISTORY — PX: CATARACT EXTRACTION W/PHACO: SHX586

## 2015-03-19 SURGERY — PHACOEMULSIFICATION, CATARACT, WITH IOL INSERTION
Anesthesia: Monitor Anesthesia Care | Site: Eye | Laterality: Left | Wound class: Clean

## 2015-03-19 MED ORDER — NA CHONDROIT SULF-NA HYALURON 40-17 MG/ML IO SOLN
INTRAOCULAR | Status: DC | PRN
  Administered 2015-03-19: 1 mL via INTRAOCULAR

## 2015-03-19 MED ORDER — TETRACAINE HCL 0.5 % OP SOLN
OPHTHALMIC | Status: AC
  Administered 2015-03-19: 1 [drp] via OPHTHALMIC
  Filled 2015-03-19: qty 2

## 2015-03-19 MED ORDER — CEFUROXIME OPHTHALMIC INJECTION 1 MG/0.1 ML
INJECTION | OPHTHALMIC | Status: DC | PRN
  Administered 2015-03-19: .1 mL via INTRACAMERAL

## 2015-03-19 MED ORDER — MIDAZOLAM HCL 2 MG/2ML IJ SOLN
INTRAMUSCULAR | Status: DC | PRN
  Administered 2015-03-19: 2 mg via INTRAVENOUS

## 2015-03-19 MED ORDER — MOXIFLOXACIN HCL 0.5 % OP SOLN
OPHTHALMIC | Status: DC | PRN
  Administered 2015-03-19: 1 [drp] via OPHTHALMIC

## 2015-03-19 MED ORDER — EPINEPHRINE HCL 1 MG/ML IJ SOLN
INTRAMUSCULAR | Status: AC
  Filled 2015-03-19: qty 1

## 2015-03-19 MED ORDER — TETRACAINE HCL 0.5 % OP SOLN
1.0000 [drp] | OPHTHALMIC | Status: AC | PRN
  Administered 2015-03-19: 1 [drp] via OPHTHALMIC

## 2015-03-19 MED ORDER — ARMC OPHTHALMIC DILATING GEL
OPHTHALMIC | Status: AC
  Administered 2015-03-19: 1 via OPHTHALMIC
  Filled 2015-03-19: qty 0.25

## 2015-03-19 MED ORDER — MOXIFLOXACIN HCL 0.5 % OP SOLN
OPHTHALMIC | Status: AC
  Filled 2015-03-19: qty 3

## 2015-03-19 MED ORDER — BSS IO SOLN
INTRAOCULAR | Status: DC | PRN
  Administered 2015-03-19: 1 mL via OPHTHALMIC

## 2015-03-19 MED ORDER — CEFUROXIME OPHTHALMIC INJECTION 1 MG/0.1 ML
INJECTION | OPHTHALMIC | Status: AC
  Filled 2015-03-19: qty 0.1

## 2015-03-19 MED ORDER — CARBACHOL 0.01 % IO SOLN
INTRAOCULAR | Status: DC | PRN
  Administered 2015-03-19: .5 mL via INTRAOCULAR

## 2015-03-19 MED ORDER — POVIDONE-IODINE 5 % OP SOLN
OPHTHALMIC | Status: AC
  Administered 2015-03-19: 1 via OPHTHALMIC
  Filled 2015-03-19: qty 30

## 2015-03-19 MED ORDER — ARMC OPHTHALMIC DILATING GEL
1.0000 "application " | OPHTHALMIC | Status: AC | PRN
  Administered 2015-03-19 (×2): 1 via OPHTHALMIC

## 2015-03-19 MED ORDER — POVIDONE-IODINE 5 % OP SOLN
1.0000 "application " | OPHTHALMIC | Status: AC | PRN
  Administered 2015-03-19: 1 via OPHTHALMIC

## 2015-03-19 MED ORDER — SODIUM CHLORIDE 0.9 % IV SOLN
INTRAVENOUS | Status: DC
  Administered 2015-03-19: 13:00:00 via INTRAVENOUS
  Administered 2015-03-19: 50 mL/h via INTRAVENOUS

## 2015-03-19 MED ORDER — MOXIFLOXACIN HCL 0.5 % OP SOLN: 1.0000 [drp] | OPHTHALMIC | Status: DC | PRN

## 2015-03-19 MED ORDER — NA CHONDROIT SULF-NA HYALURON 40-17 MG/ML IO SOLN
INTRAOCULAR | Status: AC
  Filled 2015-03-19: qty 1

## 2015-03-19 SURGICAL SUPPLY — 22 items
CANNULA ANT/CHMB 27G (MISCELLANEOUS) ×1 IMPLANT
CANNULA ANT/CHMB 27GA (MISCELLANEOUS) ×3 IMPLANT
CUP MEDICINE 2OZ PLAST GRAD ST (MISCELLANEOUS) ×3 IMPLANT
GLOVE BIO SURGEON STRL SZ8 (GLOVE) ×3 IMPLANT
GLOVE BIOGEL M 6.5 STRL (GLOVE) ×3 IMPLANT
GLOVE SURG LX 8.0 MICRO (GLOVE) ×2
GLOVE SURG LX STRL 8.0 MICRO (GLOVE) ×1 IMPLANT
GOWN STRL REUS W/ TWL LRG LVL3 (GOWN DISPOSABLE) ×2 IMPLANT
GOWN STRL REUS W/TWL LRG LVL3 (GOWN DISPOSABLE) ×6
LENS IOL TECNIS 25.5 (Intraocular Lens) ×2 IMPLANT
PACK CATARACT (MISCELLANEOUS) ×3 IMPLANT
PACK CATARACT BRASINGTON LX (MISCELLANEOUS) ×3 IMPLANT
PACK EYE AFTER SURG (MISCELLANEOUS) ×3 IMPLANT
SOL BSS BAG (MISCELLANEOUS) ×3
SOL PREP PVP 2OZ (MISCELLANEOUS) ×3
SOLUTION BSS BAG (MISCELLANEOUS) ×1 IMPLANT
SOLUTION PREP PVP 2OZ (MISCELLANEOUS) ×1 IMPLANT
SYR 3ML LL SCALE MARK (SYRINGE) ×3 IMPLANT
SYR 5ML LL (SYRINGE) ×3 IMPLANT
SYR TB 1ML 27GX1/2 LL (SYRINGE) ×3 IMPLANT
WATER STERILE IRR 1000ML POUR (IV SOLUTION) ×3 IMPLANT
WIPE NON LINTING 3.25X3.25 (MISCELLANEOUS) ×3 IMPLANT

## 2015-03-19 NOTE — Discharge Instructions (Signed)
Eye Surgery Discharge Instructions  Expect mild scratchy sensation or mild soreness. DO NOT RUB YOUR EYE!  The day of surgery:  Minimal physical activity, but bed rest is not required  No reading, computer work, or close hand work  No bending, lifting, or straining.  May watch TV  For 24 hours:  No driving, legal decisions, or alcoholic beverages  Safety precautions  Eat anything you prefer: It is better to start with liquids, then soup then solid foods.  _____ Eye patch should be worn until postoperative exam tomorrow.  ____ Solar shield eyeglasses should be worn for comfort in the sunlight/patch while sleeping  Resume all regular medications including aspirin or Coumadin if these were discontinued prior to surgery. You may shower, bathe, shave, or wash your hair. Tylenol may be taken for mild discomfort.  Call your doctor if you experience significant pain, nausea, or vomiting, fever > 101 or other signs of infection. [phone] or [phone] Specific instructions:  Follow-up Information    Follow up with Tim Lair, MD On 03/20/2015.   Specialty:  Ophthalmology   Why:  10:40   Contact information:   [address] [place] [address] [phone]

## 2015-03-19 NOTE — Op Note (Signed)
PREOPERATIVE DIAGNOSIS:  Nuclear sclerotic cataract of the left eye.   POSTOPERATIVE DIAGNOSIS:  nuclear sclerotic cataract left eye   OPERATIVE PROCEDURE:  Procedure(s): CATARACT EXTRACTION PHACO AND INTRAOCULAR LENS PLACEMENT (IOC)   SURGEON:  Birder Robson, MD.   ANESTHESIA:   No anesthesia staff entered.  1.      Managed anesthesia care. 2.      Topical tetracaine drops followed by 2% Xylocaine jelly applied in the preoperative holding area.   COMPLICATIONS:  None.   TECHNIQUE:   Stop and chop   DESCRIPTION OF PROCEDURE:  The patient was examined and consented in the preoperative holding area where the aforementioned topical anesthesia was applied to the left eye and then brought back to the Operating Room where the left eye was prepped and draped in the usual sterile ophthalmic fashion and a lid speculum was placed. A paracentesis was created with the side port blade and the anterior chamber was filled with viscoelastic. A near clear corneal incision was performed with the steel keratome. A continuous curvilinear capsulorrhexis was performed with a cystotome followed by the capsulorrhexis forceps. Hydrodissection and hydrodelineation were carried out with BSS on a blunt cannula. The lens was removed in a stop and chop  technique and the remaining cortical material was removed with the irrigation-aspiration handpiece. The capsular bag was inflated with viscoelastic and the Technis ZCB00 lens was placed in the capsular bag without complication. The remaining viscoelastic was removed from the eye with the irrigation-aspiration handpiece. The wounds were hydrated. The anterior chamber was flushed with Miostat and the eye was inflated to physiologic pressure. 0.1 mL of cefuroxime concentration 10 mg/mL was placed in the anterior chamber. The wounds were found to be water tight. The eye was dressed with Vigamox. The patient was given protective glasses to wear throughout the day and a shield  with which to sleep tonight. The patient was also given drops with which to begin a drop regimen today and will follow-up with me in one day.  Implant Name Type Inv. Item Serial No. Manufacturer Lot No. LRB No. Used  LENS IMPL INTRAOC ZCB00 25.5 - [id number] Intraocular Lens LENS IMPL INTRAOC ZCB00 25.5 [id number] [id number] Left 1   Procedure(s) with comments: CATARACT EXTRACTION PHACO AND INTRAOCULAR LENS PLACEMENT (IOC) (Left) - [place] 02:07 AP% 26.0 CDE 33.14 fluid pack lot # [id number]  Electronically signed: [place] 03/19/2015 12:58 PM

## 2015-03-19 NOTE — Anesthesia Postprocedure Evaluation (Signed)
Anesthesia Post Note  Patient: Brenda Goodman  Procedure(s) Performed: Procedure(s) (LRB): CATARACT EXTRACTION PHACO AND INTRAOCULAR LENS PLACEMENT (IOC) (Left)  Patient location during evaluation: PACU Anesthesia Type: MAC Level of consciousness: awake, awake and alert, oriented and patient cooperative Pain management: pain level controlled Vital Signs Assessment: post-procedure vital signs reviewed and stable Respiratory status: spontaneous breathing, nonlabored ventilation and respiratory function stable Cardiovascular status: blood pressure returned to baseline and stable Postop Assessment: no headache and no backache Anesthetic complications: no    Last Vitals:  Filed Vitals:   03/19/15 1149  BP: 133/96  Pulse: 78  Temp: 36.3 C  Resp: 16    Last Pain: There were no vitals filed for this visit.               Moniqua Engebretsen,  [employer]

## 2015-03-19 NOTE — Transfer of Care (Signed)
Immediate Anesthesia Transfer of Care Note  Patient: Brenda Goodman  Procedure(s) Performed: Procedure(s) with comments: CATARACT EXTRACTION PHACO AND INTRAOCULAR LENS PLACEMENT ([place]) (Left) - [place] 02:07 AP% 26.0 CDE 33.14 fluid pack lot # [id number]  Patient Location: PACU  Anesthesia Type:MAC  Level of Consciousness: awake, alert  and oriented  Airway & Oxygen Therapy: Patient Spontanous Breathing  Post-op Assessment: Report given to RN and Post -op Vital signs reviewed and stable  Post vital signs: Reviewed and stable  Last Vitals:  Filed Vitals:   03/19/15 1149  BP: 133/96  Pulse: 78  Temp: 36.3 C  Resp: 16    Complications: No apparent anesthesia complications

## 2015-03-19 NOTE — Anesthesia Preprocedure Evaluation (Addendum)
Anesthesia Evaluation  Patient identified by MRN, date of birth, ID band Patient awake    Reviewed: Allergy & Precautions, NPO status , Patient's Chart, lab work & pertinent test results, reviewed documented beta blocker date and time   History of Anesthesia Complications (+) PONV and history of anesthetic complications  Airway Mallampati: II  TM Distance: >3 FB     Dental  (+) Chipped   Pulmonary shortness of breath,           Cardiovascular      Neuro/Psych PSYCHIATRIC DISORDERS Depression    GI/Hepatic hiatal hernia, GERD  ,  Endo/Other  Hypothyroidism   Renal/GU      Musculoskeletal  (+) Arthritis ,   Abdominal   Peds  Hematology  (+) anemia ,   Anesthesia Other Findings She has had a lot of falls. No syncope. Have warned the nurses.  Reproductive/Obstetrics                            Anesthesia Physical Anesthesia Plan  ASA: III  Anesthesia Plan: MAC   Post-op Pain Management:    Induction:   Airway Management Planned:   Additional Equipment:   Intra-op Plan:   Post-operative Plan:   Informed Consent: I have reviewed the patients History and Physical, chart, labs and discussed the procedure including the risks, benefits and alternatives for the proposed anesthesia with the patient or authorized representative who has indicated his/her understanding and acceptance.     Plan Discussed with: CRNA  Anesthesia Plan Comments:         Anesthesia Quick Evaluation

## 2015-03-19 NOTE — H&P (Signed)
All labs reviewed. Abnormal studies sent to patients PCP when indicated.  Previous H&P reviewed, patient examined, there are NO CHANGES.  Brenda Goodman LOUIS1/24/201712:31 PM

## 2015-03-27 DIAGNOSIS — M810 Age-related osteoporosis without current pathological fracture: Secondary | ICD-10-CM | POA: Diagnosis not present

## 2015-03-27 DIAGNOSIS — D649 Anemia, unspecified: Secondary | ICD-10-CM | POA: Diagnosis not present

## 2015-03-27 DIAGNOSIS — E039 Hypothyroidism, unspecified: Secondary | ICD-10-CM | POA: Diagnosis not present

## 2015-04-24 DIAGNOSIS — R9431 Abnormal electrocardiogram [ECG] [EKG]: Secondary | ICD-10-CM | POA: Diagnosis not present

## 2015-04-24 DIAGNOSIS — Z961 Presence of intraocular lens: Secondary | ICD-10-CM | POA: Diagnosis not present

## 2015-04-24 DIAGNOSIS — R002 Palpitations: Secondary | ICD-10-CM | POA: Diagnosis not present

## 2015-04-24 DIAGNOSIS — R42 Dizziness and giddiness: Secondary | ICD-10-CM | POA: Diagnosis not present

## 2015-05-27 DIAGNOSIS — H35352 Cystoid macular degeneration, left eye: Secondary | ICD-10-CM | POA: Diagnosis not present

## 2015-09-11 DIAGNOSIS — L570 Actinic keratosis: Secondary | ICD-10-CM

## 2015-09-11 DIAGNOSIS — Z85828 Personal history of other malignant neoplasm of skin: Secondary | ICD-10-CM | POA: Diagnosis not present

## 2015-09-11 DIAGNOSIS — L578 Other skin changes due to chronic exposure to nonionizing radiation: Secondary | ICD-10-CM | POA: Diagnosis not present

## 2015-09-11 DIAGNOSIS — D485 Neoplasm of uncertain behavior of skin: Secondary | ICD-10-CM | POA: Diagnosis not present

## 2015-09-11 DIAGNOSIS — L821 Other seborrheic keratosis: Secondary | ICD-10-CM | POA: Diagnosis not present

## 2015-09-11 HISTORY — DX: Actinic keratosis: L57.0

## 2015-10-16 DIAGNOSIS — H43813 Vitreous degeneration, bilateral: Secondary | ICD-10-CM | POA: Diagnosis not present

## 2015-10-24 DIAGNOSIS — L570 Actinic keratosis: Secondary | ICD-10-CM | POA: Diagnosis not present

## 2015-11-06 DIAGNOSIS — R42 Dizziness and giddiness: Secondary | ICD-10-CM | POA: Diagnosis not present

## 2015-11-06 DIAGNOSIS — E039 Hypothyroidism, unspecified: Secondary | ICD-10-CM | POA: Diagnosis not present

## 2015-11-06 DIAGNOSIS — R9431 Abnormal electrocardiogram [ECG] [EKG]: Secondary | ICD-10-CM | POA: Diagnosis not present

## 2015-11-06 DIAGNOSIS — R002 Palpitations: Secondary | ICD-10-CM | POA: Diagnosis not present

## 2015-12-04 DIAGNOSIS — E039 Hypothyroidism, unspecified: Secondary | ICD-10-CM | POA: Diagnosis not present

## 2015-12-04 DIAGNOSIS — D649 Anemia, unspecified: Secondary | ICD-10-CM | POA: Diagnosis not present

## 2015-12-11 ENCOUNTER — Other Ambulatory Visit: Payer: Self-pay | Admitting: Internal Medicine

## 2015-12-11 DIAGNOSIS — F325 Major depressive disorder, single episode, in full remission: Secondary | ICD-10-CM | POA: Diagnosis not present

## 2015-12-11 DIAGNOSIS — Z0000 Encounter for general adult medical examination without abnormal findings: Secondary | ICD-10-CM | POA: Diagnosis not present

## 2015-12-11 DIAGNOSIS — R260 Ataxic gait: Secondary | ICD-10-CM | POA: Diagnosis not present

## 2015-12-11 DIAGNOSIS — Z23 Encounter for immunization: Secondary | ICD-10-CM | POA: Diagnosis not present

## 2015-12-23 ENCOUNTER — Ambulatory Visit
Admission: RE | Admit: 2015-12-23 | Discharge: 2015-12-23 | Disposition: A | Discharge status: Home / Self Care | Payer: PPO | Source: Ambulatory Visit | Attending: Internal Medicine | Admitting: Internal Medicine

## 2015-12-23 DIAGNOSIS — G9389 Other specified disorders of brain: Secondary | ICD-10-CM | POA: Insufficient documentation

## 2015-12-23 DIAGNOSIS — J329 Chronic sinusitis, unspecified: Secondary | ICD-10-CM | POA: Diagnosis not present

## 2015-12-23 DIAGNOSIS — R260 Ataxic gait: Secondary | ICD-10-CM | POA: Insufficient documentation

## 2015-12-23 DIAGNOSIS — R2689 Other abnormalities of gait and mobility: Secondary | ICD-10-CM | POA: Diagnosis not present

## 2015-12-30 DIAGNOSIS — R2681 Unsteadiness on feet: Secondary | ICD-10-CM | POA: Diagnosis not present

## 2016-01-01 DIAGNOSIS — L578 Other skin changes due to chronic exposure to nonionizing radiation: Secondary | ICD-10-CM | POA: Diagnosis not present

## 2016-01-01 DIAGNOSIS — L570 Actinic keratosis: Secondary | ICD-10-CM | POA: Diagnosis not present

## 2016-01-01 DIAGNOSIS — L820 Inflamed seborrheic keratosis: Secondary | ICD-10-CM | POA: Diagnosis not present

## 2016-01-08 DIAGNOSIS — J0110 Acute frontal sinusitis, unspecified: Secondary | ICD-10-CM | POA: Diagnosis not present

## 2016-05-18 DIAGNOSIS — R002 Palpitations: Secondary | ICD-10-CM | POA: Diagnosis not present

## 2016-05-18 DIAGNOSIS — R42 Dizziness and giddiness: Secondary | ICD-10-CM | POA: Diagnosis not present

## 2016-06-08 DIAGNOSIS — L237 Allergic contact dermatitis due to plants, except food: Secondary | ICD-10-CM | POA: Diagnosis not present

## 2016-06-08 DIAGNOSIS — L821 Other seborrheic keratosis: Secondary | ICD-10-CM | POA: Diagnosis not present

## 2016-06-12 DIAGNOSIS — E039 Hypothyroidism, unspecified: Secondary | ICD-10-CM | POA: Diagnosis not present

## 2016-06-25 DIAGNOSIS — Z0000 Encounter for general adult medical examination without abnormal findings: Secondary | ICD-10-CM | POA: Diagnosis not present

## 2016-06-25 DIAGNOSIS — K219 Gastro-esophageal reflux disease without esophagitis: Secondary | ICD-10-CM | POA: Diagnosis not present

## 2016-06-25 DIAGNOSIS — N183 Chronic kidney disease, stage 3 (moderate): Secondary | ICD-10-CM | POA: Diagnosis not present

## 2016-06-25 DIAGNOSIS — E039 Hypothyroidism, unspecified: Secondary | ICD-10-CM | POA: Diagnosis not present

## 2016-07-07 DIAGNOSIS — R2681 Unsteadiness on feet: Secondary | ICD-10-CM | POA: Diagnosis not present

## 2016-07-09 DIAGNOSIS — R296 Repeated falls: Secondary | ICD-10-CM | POA: Diagnosis not present

## 2016-08-05 DIAGNOSIS — M25511 Pain in right shoulder: Secondary | ICD-10-CM | POA: Diagnosis not present

## 2016-08-05 DIAGNOSIS — Z96611 Presence of right artificial shoulder joint: Secondary | ICD-10-CM | POA: Diagnosis not present

## 2016-08-07 ENCOUNTER — Other Ambulatory Visit: Payer: Self-pay | Admitting: Orthopedic Surgery

## 2016-08-07 DIAGNOSIS — Z96611 Presence of right artificial shoulder joint: Secondary | ICD-10-CM

## 2016-08-10 ENCOUNTER — Ambulatory Visit
Admission: RE | Admit: 2016-08-10 | Discharge: 2016-08-10 | Disposition: A | Discharge status: Home / Self Care | Payer: PPO | Source: Ambulatory Visit | Attending: Orthopedic Surgery | Admitting: Orthopedic Surgery

## 2016-08-10 ENCOUNTER — Other Ambulatory Visit: Payer: Self-pay | Admitting: Orthopedic Surgery

## 2016-08-10 ENCOUNTER — Other Ambulatory Visit: Payer: PPO

## 2016-08-10 DIAGNOSIS — Z96611 Presence of right artificial shoulder joint: Secondary | ICD-10-CM

## 2016-08-10 DIAGNOSIS — Z471 Aftercare following joint replacement surgery: Secondary | ICD-10-CM | POA: Diagnosis not present

## 2016-08-13 DIAGNOSIS — M25511 Pain in right shoulder: Secondary | ICD-10-CM | POA: Diagnosis not present

## 2016-08-17 DIAGNOSIS — E039 Hypothyroidism, unspecified: Secondary | ICD-10-CM | POA: Diagnosis not present

## 2016-08-17 DIAGNOSIS — R002 Palpitations: Secondary | ICD-10-CM | POA: Diagnosis not present

## 2016-08-17 DIAGNOSIS — R42 Dizziness and giddiness: Secondary | ICD-10-CM | POA: Diagnosis not present

## 2016-08-20 NOTE — H&P (Signed)
Brenda Goodman is an 83 y.o. female.    Chief Complaint: right shoulder pain   HPI: Pt is a 83 y.o. female complaining of right shoulder pain for multiple years. Pain had continually increased since the beginning. X-rays in the clinic show previous hemi arthroplasty that remains painful with rotator cuff insufficiency. Pt has tried various conservative treatments which have failed to alleviate their symptoms, including injections and therapy. Various options are discussed with the patient. Risks, benefits and expectations were discussed with the patient. Patient understand the risks, benefits and expectations and wishes to proceed with surgery.   PCP:  Glendon Axe, MD  D/C Plans: Home  PMH: Past Medical History:  Diagnosis Date  . Anemia    followed by pcp and unconcerned.  . Arthritis   . Cancer ([place])    skin  . Cough    CHRONIC  . Depression   . Diverticulosis   . Dizziness    possibly cardiac related  . GERD (gastroesophageal reflux disease)   . H/O hiatal hernia   . Hypothyroidism   . PONV (postoperative nausea and vomiting)   . Shortness of breath    COUGH , magnesium helps this  . Wheezing     PSH: Past Surgical History:  Procedure Laterality Date  . CARDIOVASCULAR STRESS TEST     2012   [place]     . CATARACT EXTRACTION W/PHACO Right 02/26/2015   Procedure: CATARACT EXTRACTION PHACO AND INTRAOCULAR LENS PLACEMENT (IOC);  Surgeon: Birder Robson, MD;  Location: [facility] ORS;  Service: Ophthalmology;  Laterality: Right;  [place]  02:00 AP% 27.4 CDE 32.96 fluid pack lot # [id number]  . CATARACT EXTRACTION W/PHACO Left 03/19/2015   Procedure: CATARACT EXTRACTION PHACO AND INTRAOCULAR LENS PLACEMENT ([place]);  Surgeon: Birder Robson, MD;  Location: [facility] ORS;  Service: Ophthalmology;  Laterality: Left;  [place] 02:07 AP% 26.0 CDE 33.14 fluid pack lot # [id number]  . CHOLECYSTECTOMY    . hardware in right shoulder Right 2010   ball and pin in shoulder  .  HERNIA REPAIR     2012  . LUMBAR LAMINECTOMY/DECOMPRESSION MICRODISCECTOMY  05/04/2011   Procedure: LUMBAR LAMINECTOMY/DECOMPRESSION MICRODISCECTOMY 1 LEVEL;  Surgeon: Ophelia Charter, MD;  Location: [facility];  Service: Neurosurgery;  Laterality: N/A;  Lumbar three and lumbar four Laminectomy  . NO PAST SURGERIES     RIGHT ARM SURGERY BALL + PIN   . THYROID LOBECTOMY  2016    APPROX 4 YRS AGO   . THYROIDECTOMY    . TONSILLECTOMY      Social History:  reports that she has never smoked. She does not have any smokeless tobacco history on file. She reports that she does not drink alcohol or use drugs.  Allergies:  No Known Allergies  Medications: No current facility-administered medications for this encounter.    Current Outpatient Prescriptions  Medication Sig Dispense Refill  . calcium-vitamin D (OSCAL 500/200 D-3) 500-200 MG-UNIT per tablet Take 1 tablet by mouth 2 (two) times daily.    . cholecalciferol (VITAMIN D) 1000 UNITS tablet Take 1,000 Units by mouth daily.    . colestipol (COLESTID) 1 G tablet Take 2 g by mouth 2 (two) times daily.    Mar[place] Kitchen FLUoxetine (PROZAC) 20 MG capsule Take 20 mg by mouth daily.    Marland Kitchen levothyroxine (SYNTHROID, LEVOTHROID) 125 MCG tablet Take 125 mcg by mouth daily before breakfast.    . magnesium oxide (MAG-OX) 400 MG tablet Take 400 mg by mouth daily.    Marland Kitchen  omeprazole (PRILOSEC) 20 MG capsule Take 20 mg by mouth daily.    . zoledronic acid (RECLAST) 5 MG/100ML SOLN injection Inject 5 mg into the vein once. Takes once a year!      No results found for this or any previous visit (from the past 48 hour(s)). No results found.  ROS: Pain with rom of the right upper extremity  Physical Exam:  Alert and oriented 83 y.o. female in no acute distress Cranial nerves 2-12 intact Cervical spine: full rom with no tenderness, nv intact distally Chest: active breath sounds bilaterally, no wheeze rhonchi or rales Heart: regular rate and rhythm, no  murmur Abd: non tender non distended with active bowel sounds Hip is stable with rom  Right shoulder with minimal rom of right upper extremity Painful rom nv intact distally  Assessment/Plan Assessment: painful hemi athroplasty right shoulder  Plan: Patient will undergo a conversion from hemi to reverse total shoulder by Dr. Veverly Fells at [facility]. Risks benefits and expectations were discussed with the patient. Patient understand risks, benefits and expectations and wishes to proceed.

## 2016-08-21 NOTE — Pre-Procedure Instructions (Signed)
Brenda Goodman  08/21/2016      [place], [address] [place] [address] Phone: [phone] Fax: [place], [address] [place] [address] Phone: [phone] Fax: [phone]  [facility] [place], [place] [address] Phone: [phone] Fax: [phone]    Your procedure is scheduled on July 13  Report to [facility] at [phone].  Call this number if you have problems the morning of surgery:  [phone]   Remember:  Do not eat food or drink liquids after midnight.  Take these medicines the morning of surgery with A SIP OF WATER Fluoxetine (Prozac), Levothyroxine (Synthroid), omeprazole(Prilosec), Flonase spray if needed  Stop taking aspirin, BC's, Goody's, herbal medications, Fish Oil, Vitamins, Ibuprofen, Advil, Motrin, Aleve   Do not wear jewelry, make-up or nail polish.  Do not wear lotions, powders, or perfumes, or deoderant.  Do not shave 48 hours prior to surgery.  Men may shave face and neck.  Do not bring valuables to the hospital.  [facility] is not responsible for any belongings or valuables.  Contacts, dentures or bridgework may not be worn into surgery.  Leave your suitcase in the car.  After surgery it may be brought to your room.  For patients admitted to the hospital, discharge time will be determined by your treatment team.  Patients discharged the day of surgery will not be allowed to drive home.   Special instructions:  [facility] - Preparing for Surgery  Before surgery, you can play an important role.  Because skin is not sterile, your skin needs to be as free of germs as possible.  You can reduce the number of germs on you skin by washing with CHG (chlorahexidine gluconate) soap before surgery.  CHG is an  antiseptic cleaner which kills germs and bonds with the skin to continue killing germs even after washing.  Please DO NOT use if you have an allergy to CHG or antibacterial soaps.  If your skin becomes reddened/irritated stop using the CHG and inform your nurse when you arrive at Short Stay.  Do not shave (including legs and underarms) for at least 48 hours prior to the first CHG shower.  You may shave your face.  Please follow these instructions carefully:   1.  Shower with CHG Soap the night before surgery and the                                morning of Surgery.  2.  If you choose to wash your hair, wash your hair first as usual with your       normal shampoo.  3.  After you shampoo, rinse your hair and body thoroughly to remove the                      Shampoo.  4.  Use CHG as you would any other liquid soap.  You can apply chg directly       to the skin and wash gently with scrungie or a clean washcloth.  5.  Apply the CHG Soap to your body ONLY FROM THE NECK DOWN.        Do not use on open  wounds or open sores.  Avoid contact with your eyes,       ears, mouth and genitals (private parts).  Wash genitals (private parts)       with your normal soap.  6.  Wash thoroughly, paying special attention to the area where your surgery        will be performed.  7.  Thoroughly rinse your body with warm water from the neck down.  8.  DO NOT shower/wash with your normal soap after using and rinsing off       the CHG Soap.  9.  Pat yourself dry with a clean towel.            10.  Wear clean pajamas.            11.  Place clean sheets on your bed the night of your first shower and do not        sleep with pets.  Day of Surgery  Do not apply any lotions/deoderants the morning of surgery.  Please wear clean clothes to the hospital/surgery center.     Please read over the following fact sheets that you were given. Pain Booklet, Coughing and Deep Breathing, MRSA Information and Surgical Site Infection  Prevention, Incentive Spirometry

## 2016-08-24 ENCOUNTER — Encounter (HOSPITAL_COMMUNITY)
Admission: RE | Admit: 2016-08-24 | Discharge: 2016-08-24 | Disposition: A | Discharge status: Home / Self Care | Payer: PPO | Source: Ambulatory Visit | Attending: Orthopedic Surgery | Admitting: Orthopedic Surgery

## 2016-08-24 ENCOUNTER — Encounter (HOSPITAL_COMMUNITY): Payer: Self-pay

## 2016-08-24 DIAGNOSIS — E039 Hypothyroidism, unspecified: Secondary | ICD-10-CM | POA: Insufficient documentation

## 2016-08-24 DIAGNOSIS — R079 Chest pain, unspecified: Secondary | ICD-10-CM | POA: Diagnosis not present

## 2016-08-24 DIAGNOSIS — Z01812 Encounter for preprocedural laboratory examination: Secondary | ICD-10-CM | POA: Diagnosis not present

## 2016-08-24 DIAGNOSIS — K219 Gastro-esophageal reflux disease without esophagitis: Secondary | ICD-10-CM | POA: Diagnosis not present

## 2016-08-24 DIAGNOSIS — R002 Palpitations: Secondary | ICD-10-CM | POA: Insufficient documentation

## 2016-08-24 LAB — CBC
HCT: 41.4 % (ref 36.0–46.0)
Hemoglobin: 13.2 g/dL (ref 12.0–15.0)
MCH: 29.3 pg (ref 26.0–34.0)
MCHC: 31.9 g/dL (ref 30.0–36.0)
MCV: 91.8 fL (ref 78.0–100.0)
PLATELETS: 276 10*3/uL (ref 150–400)
RBC: 4.51 MIL/uL (ref 3.87–5.11)
RDW: 13.6 % (ref 11.5–15.5)
WBC: 8.1 10*3/uL (ref 4.0–10.5)

## 2016-08-24 LAB — SURGICAL PCR SCREEN
MRSA, PCR: NEGATIVE
Staphylococcus aureus: NEGATIVE

## 2016-08-24 LAB — TYPE AND SCREEN
ABO/RH(D): O NEG
Antibody Screen: NEGATIVE

## 2016-08-24 LAB — BASIC METABOLIC PANEL
Anion gap: 8 (ref 5–15)
BUN: 11 mg/dL (ref 6–20)
CALCIUM: 9.9 mg/dL (ref 8.9–10.3)
CO2: 26 mmol/L (ref 22–32)
Chloride: 103 mmol/L (ref 101–111)
Creatinine, Ser: 0.85 mg/dL (ref 0.44–1.00)
GFR calc non Af Amer: >60 mL/min (ref >60)
GLUCOSE: 90 mg/dL (ref 65–99)
Potassium: 4.3 mmol/L (ref 3.5–5.1)
Sodium: 137 mmol/L (ref 135–145)

## 2016-08-24 NOTE — Progress Notes (Signed)
Anesthesia Chart Review:  Pt is an 83 year old female scheduled for R reverse total shoulder arthroplasty revision on 09/04/2016 with Netta Cedars, MD  - PCP is Glendon Axe, MD (notes in care everywhere)  - Cardiologist is Bartholome Bill, MD, who sees pt for palpitations and atypical chest pain, cleared pt for surgery at last office visit 08/17/16 (notes in care everywhere)  - Neurologist is Jennings Books, MD who sees pt for unsteady gait (notes in care everywhere)   PMH includes:  Palpitations, hypothyroidism, anemia, post-op N/V, GERD. Never smoker. BMI 27.5. S/p cataract extraction 03/19/15, 02/26/15. S/p lumbar laminectomy 05/04/11.   Medications include: Colestipol, iron, levothyroxine, Prilosec  Preoperative labs reviewed.    EKG 05/18/16 (care everywhere): report states NSR (tracing requested).   Holter monitor 11/09/14: from Dr. Bethanne Ginger notes, holter results showed "no significant bradycardic episodes during waking hours. She has some bradycardia during sleeping hours with otherwise unremarkable"  Nuclear stress test 11/08/14 (care everywhere): Negative Lexiscan stress with no ischemia  Echo 10/11/14 (care everywhere):  1. Normal LV systolic function. 2. Normal RV systolic function. 3. Mild AR, mild TR. 4. Trivial MR, trivial PR 5. No valvular stenosis.  If no changes, I anticipate pt can proceed with surgery as scheduled.   Willeen Cass, FNP-BC [facility]/Anesthesiology Phone: [phone] 08/24/2016 2:48 PM

## 2016-08-24 NOTE — Progress Notes (Signed)
PT DENIES HAVING CHEST PAIN, OR SOB, BUT IS UNDER THE CARE OF A CARDIOLOGIST, DR. Bartholome Bill. PT DENIES HAVING A CARDIAC CATH, OR RECENT LAB WORK. PT ALSO DENIES HAVING A CXR WITHIN THE LAST YEAR. PT VERBALIZED AN UNDERSTANDING OF ALL INSTRUCTIONS AND REMINDED TO READ OVER THE INSTRUCTIONS WHILE AT HOME FOR A BETTER UNDERSTANDING.

## 2016-09-04 ENCOUNTER — Inpatient Hospital Stay (HOSPITAL_COMMUNITY): Payer: PPO | Admitting: Emergency Medicine

## 2016-09-04 ENCOUNTER — Encounter (HOSPITAL_COMMUNITY): Payer: Self-pay | Admitting: *Deleted

## 2016-09-04 ENCOUNTER — Encounter (HOSPITAL_COMMUNITY)
Admission: RE | Disposition: A | Discharge status: Home / Self Care | Payer: Self-pay | Source: Ambulatory Visit | Attending: Orthopedic Surgery

## 2016-09-04 ENCOUNTER — Inpatient Hospital Stay (HOSPITAL_COMMUNITY): Payer: PPO | Admitting: Anesthesiology

## 2016-09-04 ENCOUNTER — Inpatient Hospital Stay (HOSPITAL_COMMUNITY): Payer: PPO

## 2016-09-04 ENCOUNTER — Inpatient Hospital Stay (HOSPITAL_COMMUNITY)
Admission: RE | Admit: 2016-09-04 | Discharge: 2016-09-05 | DRG: 483 | Disposition: A | Discharge status: Home / Self Care | Payer: PPO | Source: Ambulatory Visit | Attending: Orthopedic Surgery | Admitting: Orthopedic Surgery

## 2016-09-04 DIAGNOSIS — E890 Postprocedural hypothyroidism: Secondary | ICD-10-CM | POA: Diagnosis not present

## 2016-09-04 DIAGNOSIS — D649 Anemia, unspecified: Secondary | ICD-10-CM | POA: Diagnosis present

## 2016-09-04 DIAGNOSIS — Z96611 Presence of right artificial shoulder joint: Secondary | ICD-10-CM | POA: Diagnosis not present

## 2016-09-04 DIAGNOSIS — S42121A Displaced fracture of acromial process, right shoulder, initial encounter for closed fracture: Secondary | ICD-10-CM | POA: Diagnosis not present

## 2016-09-04 DIAGNOSIS — M25511 Pain in right shoulder: Secondary | ICD-10-CM | POA: Diagnosis not present

## 2016-09-04 DIAGNOSIS — M25811 Other specified joint disorders, right shoulder: Secondary | ICD-10-CM | POA: Diagnosis not present

## 2016-09-04 DIAGNOSIS — F329 Major depressive disorder, single episode, unspecified: Secondary | ICD-10-CM | POA: Diagnosis present

## 2016-09-04 DIAGNOSIS — Z471 Aftercare following joint replacement surgery: Secondary | ICD-10-CM | POA: Diagnosis not present

## 2016-09-04 DIAGNOSIS — T8484XA Pain due to internal orthopedic prosthetic devices, implants and grafts, initial encounter: Secondary | ICD-10-CM | POA: Diagnosis not present

## 2016-09-04 DIAGNOSIS — K219 Gastro-esophageal reflux disease without esophagitis: Secondary | ICD-10-CM | POA: Diagnosis present

## 2016-09-04 DIAGNOSIS — T84098A Other mechanical complication of other internal joint prosthesis, initial encounter: Secondary | ICD-10-CM | POA: Diagnosis not present

## 2016-09-04 DIAGNOSIS — E039 Hypothyroidism, unspecified: Secondary | ICD-10-CM | POA: Diagnosis not present

## 2016-09-04 DIAGNOSIS — G8918 Other acute postprocedural pain: Secondary | ICD-10-CM | POA: Diagnosis not present

## 2016-09-04 HISTORY — PX: REVERSE SHOULDER ARTHROPLASTY: SHX5054

## 2016-09-04 SURGERY — ARTHROPLASTY, SHOULDER, TOTAL, REVERSE
Anesthesia: Regional | Site: Shoulder | Laterality: Right

## 2016-09-04 MED ORDER — PROPOFOL 10 MG/ML IV BOLUS
INTRAVENOUS | Status: AC
  Filled 2016-09-04: qty 20

## 2016-09-04 MED ORDER — FLUOXETINE HCL 10 MG PO CAPS
10.0000 mg | ORAL_CAPSULE | Freq: Every day | ORAL | Status: DC
  Filled 2016-09-04 (×3): qty 1

## 2016-09-04 MED ORDER — FENTANYL CITRATE (PF) 100 MCG/2ML IJ SOLN
INTRAMUSCULAR | Status: AC
  Filled 2016-09-04: qty 2

## 2016-09-04 MED ORDER — DEXAMETHASONE SODIUM PHOSPHATE 10 MG/ML IJ SOLN
INTRAMUSCULAR | Status: AC
  Filled 2016-09-04: qty 1

## 2016-09-04 MED ORDER — FENTANYL CITRATE (PF) 250 MCG/5ML IJ SOLN
INTRAMUSCULAR | Status: AC
  Filled 2016-09-04: qty 5

## 2016-09-04 MED ORDER — CHLORHEXIDINE GLUCONATE 4 % EX LIQD: 60.0000 mL | Freq: Once | CUTANEOUS | Status: DC

## 2016-09-04 MED ORDER — MENTHOL 3 MG MT LOZG: 1.0000 | LOZENGE | OROMUCOSAL | Status: DC | PRN

## 2016-09-04 MED ORDER — PHENYLEPHRINE HCL 10 MG/ML IJ SOLN
INTRAVENOUS | Status: DC | PRN
  Administered 2016-09-04: 50 ug/min via INTRAVENOUS

## 2016-09-04 MED ORDER — SODIUM CHLORIDE 0.9 % IV SOLN
INTRAVENOUS | Status: DC
  Administered 2016-09-04: 15:00:00 via INTRAVENOUS

## 2016-09-04 MED ORDER — THROMBIN 20000 UNITS EX SOLR
CUTANEOUS | Status: AC
  Filled 2016-09-04: qty 20000

## 2016-09-04 MED ORDER — MAGNESIUM OXIDE 400 (241.3 MG) MG PO TABS
400.0000 mg | ORAL_TABLET | Freq: Every day | ORAL | Status: DC
  Administered 2016-09-05: 400 mg via ORAL
  Filled 2016-09-04: qty 1

## 2016-09-04 MED ORDER — ONDANSETRON HCL 4 MG/2ML IJ SOLN: 4.0000 mg | Freq: Four times a day (QID) | INTRAMUSCULAR | Status: DC | PRN

## 2016-09-04 MED ORDER — POLYETHYLENE GLYCOL 3350 17 G PO PACK: 17.0000 g | PACK | Freq: Every day | ORAL | Status: DC | PRN

## 2016-09-04 MED ORDER — THROMBIN 20000 UNITS EX SOLR
OROMUCOSAL | Status: DC | PRN
  Administered 2016-09-04: 09:00:00 via TOPICAL

## 2016-09-04 MED ORDER — OXYCODONE HCL 5 MG/5ML PO SOLN: 5.0000 mg | Freq: Once | ORAL | Status: DC | PRN

## 2016-09-04 MED ORDER — DOCUSATE SODIUM 100 MG PO CAPS
100.0000 mg | ORAL_CAPSULE | Freq: Two times a day (BID) | ORAL | Status: DC
  Administered 2016-09-04 – 2016-09-05 (×3): 100 mg via ORAL
  Filled 2016-09-04 (×3): qty 1

## 2016-09-04 MED ORDER — PROPOFOL 10 MG/ML IV BOLUS
INTRAVENOUS | Status: DC | PRN
  Administered 2016-09-04: 100 mg via INTRAVENOUS

## 2016-09-04 MED ORDER — VITAMIN D 1000 UNITS PO TABS
1000.0000 [IU] | ORAL_TABLET | Freq: Every day | ORAL | Status: DC
  Administered 2016-09-05: 1000 [IU] via ORAL
  Filled 2016-09-04: qty 1

## 2016-09-04 MED ORDER — HYDROCODONE-ACETAMINOPHEN 5-325 MG PO TABS: 1.0000 | ORAL_TABLET | Freq: Four times a day (QID) | ORAL | 0 refills | Status: DC | PRN

## 2016-09-04 MED ORDER — BUPIVACAINE-EPINEPHRINE (PF) 0.25% -1:200000 IJ SOLN
INTRAMUSCULAR | Status: AC
  Filled 2016-09-04: qty 30

## 2016-09-04 MED ORDER — ZOLEDRONIC ACID 5 MG/100ML IV SOLN: 5.0000 mg | Freq: Once | INTRAVENOUS | Status: DC

## 2016-09-04 MED ORDER — FENTANYL CITRATE (PF) 100 MCG/2ML IJ SOLN
25.0000 ug | INTRAMUSCULAR | Status: DC | PRN
  Administered 2016-09-04: 50 ug via INTRAVENOUS

## 2016-09-04 MED ORDER — FLUTICASONE PROPIONATE 50 MCG/ACT NA SUSP
1.0000 | Freq: Two times a day (BID) | NASAL | Status: DC | PRN
  Filled 2016-09-04: qty 16

## 2016-09-04 MED ORDER — LACTATED RINGERS IV SOLN
INTRAVENOUS | Status: DC | PRN
  Administered 2016-09-04 (×2): via INTRAVENOUS

## 2016-09-04 MED ORDER — METOCLOPRAMIDE HCL 5 MG PO TABS: 5.0000 mg | ORAL_TABLET | Freq: Three times a day (TID) | ORAL | Status: DC | PRN

## 2016-09-04 MED ORDER — OXYCODONE HCL 5 MG PO TABS: 5.0000 mg | ORAL_TABLET | Freq: Once | ORAL | Status: DC | PRN

## 2016-09-04 MED ORDER — SUGAMMADEX SODIUM 200 MG/2ML IV SOLN
INTRAVENOUS | Status: DC | PRN
  Administered 2016-09-04: 200 mg via INTRAVENOUS

## 2016-09-04 MED ORDER — EPHEDRINE 5 MG/ML INJ
INTRAVENOUS | Status: AC
  Filled 2016-09-04: qty 10

## 2016-09-04 MED ORDER — ACETAMINOPHEN 500 MG PO TABS: 1000.0000 mg | ORAL_TABLET | Freq: Every evening | ORAL | Status: DC | PRN

## 2016-09-04 MED ORDER — HYDROCODONE-ACETAMINOPHEN 5-325 MG PO TABS
1.0000 | ORAL_TABLET | ORAL | Status: DC | PRN
  Administered 2016-09-05: 1 via ORAL
  Filled 2016-09-04: qty 1

## 2016-09-04 MED ORDER — BUPIVACAINE-EPINEPHRINE (PF) 0.5% -1:200000 IJ SOLN
INTRAMUSCULAR | Status: DC | PRN
  Administered 2016-09-04: 25 mL via PERINEURAL

## 2016-09-04 MED ORDER — SIMETHICONE 40 MG/0.6ML PO SUSP
40.0000 mg | Freq: Four times a day (QID) | ORAL | Status: DC | PRN
  Filled 2016-09-04: qty 0.6

## 2016-09-04 MED ORDER — COLESTIPOL HCL 1 G PO TABS
2.0000 g | ORAL_TABLET | Freq: Two times a day (BID) | ORAL | Status: DC
  Filled 2016-09-04 (×2): qty 2

## 2016-09-04 MED ORDER — BUPIVACAINE-EPINEPHRINE 0.25% -1:200000 IJ SOLN
INTRAMUSCULAR | Status: DC | PRN
  Administered 2016-09-04: 30 mL

## 2016-09-04 MED ORDER — PANTOPRAZOLE SODIUM 40 MG PO TBEC
40.0000 mg | DELAYED_RELEASE_TABLET | Freq: Every day | ORAL | Status: DC
  Administered 2016-09-05: 40 mg via ORAL
  Filled 2016-09-04: qty 1

## 2016-09-04 MED ORDER — EPHEDRINE SULFATE-NACL 50-0.9 MG/10ML-% IV SOSY
PREFILLED_SYRINGE | INTRAVENOUS | Status: DC | PRN
  Administered 2016-09-04: 7.5 mg via INTRAVENOUS

## 2016-09-04 MED ORDER — SODIUM CHLORIDE 0.9 % IR SOLN
Status: DC | PRN
  Administered 2016-09-04: 1000 mL

## 2016-09-04 MED ORDER — ACETAMINOPHEN 650 MG RE SUPP: 650.0000 mg | Freq: Four times a day (QID) | RECTAL | Status: DC | PRN

## 2016-09-04 MED ORDER — PHENOL 1.4 % MT LIQD: 1.0000 | OROMUCOSAL | Status: DC | PRN

## 2016-09-04 MED ORDER — LEVOTHYROXINE SODIUM 25 MCG PO TABS
125.0000 ug | ORAL_TABLET | Freq: Every day | ORAL | Status: DC
  Administered 2016-09-05: 125 ug via ORAL
  Filled 2016-09-04 (×2): qty 1

## 2016-09-04 MED ORDER — METOCLOPRAMIDE HCL 5 MG/ML IJ SOLN: 5.0000 mg | Freq: Three times a day (TID) | INTRAMUSCULAR | Status: DC | PRN

## 2016-09-04 MED ORDER — BISACODYL 10 MG RE SUPP
10.0000 mg | Freq: Every day | RECTAL | Status: DC | PRN
Start: 2016-09-04 — End: 2016-09-05

## 2016-09-04 MED ORDER — FENTANYL CITRATE (PF) 100 MCG/2ML IJ SOLN
INTRAMUSCULAR | Status: DC | PRN
  Administered 2016-09-04 (×3): 50 ug via INTRAVENOUS

## 2016-09-04 MED ORDER — SIMETHICONE 40 MG/0.6ML PO SUSP
40.0000 mg | Freq: Four times a day (QID) | ORAL | Status: DC | PRN
  Filled 2016-09-04: qty 1.2

## 2016-09-04 MED ORDER — DEXAMETHASONE SODIUM PHOSPHATE 10 MG/ML IJ SOLN
INTRAMUSCULAR | Status: DC | PRN
  Administered 2016-09-04: 10 mg via INTRAVENOUS

## 2016-09-04 MED ORDER — ROCURONIUM BROMIDE 50 MG/5ML IV SOLN
INTRAVENOUS | Status: AC
  Filled 2016-09-04: qty 1

## 2016-09-04 MED ORDER — ONDANSETRON HCL 4 MG/2ML IJ SOLN
INTRAMUSCULAR | Status: DC | PRN
  Administered 2016-09-04: 4 mg via INTRAVENOUS

## 2016-09-04 MED ORDER — SUGAMMADEX SODIUM 200 MG/2ML IV SOLN
INTRAVENOUS | Status: AC
  Filled 2016-09-04: qty 2

## 2016-09-04 MED ORDER — ACETAMINOPHEN 325 MG PO TABS
650.0000 mg | ORAL_TABLET | Freq: Four times a day (QID) | ORAL | Status: DC | PRN
  Administered 2016-09-05: 650 mg via ORAL

## 2016-09-04 MED ORDER — CEFAZOLIN SODIUM-DEXTROSE 2-4 GM/100ML-% IV SOLN
2.0000 g | Freq: Four times a day (QID) | INTRAVENOUS | Status: AC
  Administered 2016-09-04 – 2016-09-05 (×3): 2 g via INTRAVENOUS
  Filled 2016-09-04 (×6): qty 100

## 2016-09-04 MED ORDER — ONDANSETRON HCL 4 MG PO TABS: 4.0000 mg | ORAL_TABLET | Freq: Four times a day (QID) | ORAL | Status: DC | PRN

## 2016-09-04 MED ORDER — CEFAZOLIN SODIUM-DEXTROSE 2-4 GM/100ML-% IV SOLN
2.0000 g | INTRAVENOUS | Status: AC
  Administered 2016-09-04: 2 g via INTRAVENOUS
  Filled 2016-09-04: qty 100

## 2016-09-04 MED ORDER — METOCLOPRAMIDE HCL 10 MG PO TABS
10.0000 mg | ORAL_TABLET | Freq: Three times a day (TID) | ORAL | Status: DC
  Administered 2016-09-05: 10 mg via ORAL
  Filled 2016-09-04: qty 1

## 2016-09-04 MED ORDER — ROCURONIUM BROMIDE 100 MG/10ML IV SOLN
INTRAVENOUS | Status: DC | PRN
  Administered 2016-09-04: 30 mg via INTRAVENOUS

## 2016-09-04 MED ORDER — ONDANSETRON HCL 4 MG/2ML IJ SOLN
INTRAMUSCULAR | Status: AC
  Filled 2016-09-04: qty 2

## 2016-09-04 SURGICAL SUPPLY — 84 items
BIT DRILL 170X2.5X (BIT) IMPLANT
BIT DRILL 2.0 LNG QUCK RELEASE (BIT) IMPLANT
BIT DRILL 2.8X5 QR DISP (BIT) ×2 IMPLANT
BIT DRILL 5/64X5 DISP (BIT) ×3 IMPLANT
BIT DRL 170X2.5X (BIT)
BLADE SAG 18X100X1.27 (BLADE) ×3 IMPLANT
CAPT SHLDR REVTOTAL 1 ×2 IMPLANT
CEMENT HV SMART SET (Cement) ×2 IMPLANT
CLOSURE STERI-STRIP 1/2X4 (GAUZE/BANDAGES/DRESSINGS) ×1
CLOSURE WOUND 1/2 X4 (GAUZE/BANDAGES/DRESSINGS) ×1
CLSR STERI-STRIP ANTIMIC 1/2X4 (GAUZE/BANDAGES/DRESSINGS) ×1 IMPLANT
COVER SURGICAL LIGHT HANDLE (MISCELLANEOUS) ×3 IMPLANT
DRAPE IMP U-DRAPE 54X76 (DRAPES) ×6 IMPLANT
DRAPE INCISE IOBAN 66X45 STRL (DRAPES) ×3 IMPLANT
DRAPE ORTHO SPLIT 77X108 STRL (DRAPES) ×6
DRAPE SURG ORHT 6 SPLT 77X108 (DRAPES) ×2 IMPLANT
DRAPE U-SHAPE 47X51 STRL (DRAPES) ×3 IMPLANT
DRILL 2.0 LNG QUICK RELEASE (BIT) ×3
DRILL 2.5 (BIT)
DRSG ADAPTIC 3X8 NADH LF (GAUZE/BANDAGES/DRESSINGS) ×3 IMPLANT
DRSG PAD ABDOMINAL 8X10 ST (GAUZE/BANDAGES/DRESSINGS) ×3 IMPLANT
DURAPREP 26ML APPLICATOR (WOUND CARE) ×3 IMPLANT
ELECT BLADE 4.0 EZ CLEAN MEGAD (MISCELLANEOUS) ×3
ELECT NDL TIP 2.8 STRL (NEEDLE) ×1 IMPLANT
ELECT NEEDLE TIP 2.8 STRL (NEEDLE) ×3 IMPLANT
ELECT REM PT RETURN 9FT ADLT (ELECTROSURGICAL) ×3
ELECTRODE BLDE 4.0 EZ CLN MEGD (MISCELLANEOUS) ×1 IMPLANT
ELECTRODE REM PT RTRN 9FT ADLT (ELECTROSURGICAL) ×1 IMPLANT
GAUZE SPONGE 4X4 12PLY STRL (GAUZE/BANDAGES/DRESSINGS) ×3 IMPLANT
GLENO PIN (PIN) IMPLANT
GLOVE BIOGEL PI ORTHO PRO 7.5 (GLOVE) ×2
GLOVE BIOGEL PI ORTHO PRO SZ8 (GLOVE) ×2
GLOVE ORTHO TXT STRL SZ7.5 (GLOVE) ×3 IMPLANT
GLOVE PI ORTHO PRO STRL 7.5 (GLOVE) ×1 IMPLANT
GLOVE PI ORTHO PRO STRL SZ8 (GLOVE) ×1 IMPLANT
GLOVE SURG ORTHO 8.5 STRL (GLOVE) ×3 IMPLANT
GOWN STRL REUS W/ TWL LRG LVL3 (GOWN DISPOSABLE) ×1 IMPLANT
GOWN STRL REUS W/ TWL XL LVL3 (GOWN DISPOSABLE) ×2 IMPLANT
GOWN STRL REUS W/TWL LRG LVL3 (GOWN DISPOSABLE) ×3
GOWN STRL REUS W/TWL XL LVL3 (GOWN DISPOSABLE) ×6
GUIDEWIRE ORTHO .059X5 (WIRE) ×2 IMPLANT
KIT BASIN OR (CUSTOM PROCEDURE TRAY) ×3 IMPLANT
KIT ROOM TURNOVER OR (KITS) ×3 IMPLANT
MANIFOLD NEPTUNE II (INSTRUMENTS) ×3 IMPLANT
NDL 1/2 CIR MAYO (NEEDLE) ×1 IMPLANT
NDL HYPO 25GX1X1/2 BEV (NEEDLE) ×1 IMPLANT
NEEDLE 1/2 CIR MAYO (NEEDLE) ×3 IMPLANT
NEEDLE HYPO 25GX1X1/2 BEV (NEEDLE) ×3 IMPLANT
NS IRRIG 1000ML POUR BTL (IV SOLUTION) ×3 IMPLANT
PACK SHOULDER (CUSTOM PROCEDURE TRAY) ×3 IMPLANT
PAD ARMBOARD 7.5X6 YLW CONV (MISCELLANEOUS) ×6 IMPLANT
PIN GUIDE GLENOPHERE 1.5MX300M (PIN) IMPLANT
PIN METAGLENE 2.5 (PIN) IMPLANT
PLATE RIGHT DISTAL CLAVICLE (Plate) ×2 IMPLANT
SCREW 2.3X12MM (Screw) ×4 IMPLANT
SCREW CORTICAL 2.3X10MM (Screw) ×8 IMPLANT
SCREW HEXALOBE NON-LOCK 3.5X14 (Screw) ×2 IMPLANT
SCREW LOCK FT 8X2.3X HEXNS (Screw) IMPLANT
SCREW LOCKING 2.3X8MM (Screw) ×6 IMPLANT
SCREW NON LOCKING HEX 3.5X22 (Screw) ×2 IMPLANT
SCREW NON LOCKING HEX 3.5X45 (Screw) ×2 IMPLANT
SLEEVE BEADED CABLE (Orthopedic Implant) ×6 IMPLANT
SLEEVE CABLE 1.6MM (Orthopedic Implant) ×4 IMPLANT
SLING ARM FOAM STRAP LRG (SOFTGOODS) ×2 IMPLANT
SLING ARM FOAM STRAP MED (SOFTGOODS) IMPLANT
SPONGE LAP 18X18 X RAY DECT (DISPOSABLE) IMPLANT
SPONGE LAP 4X18 X RAY DECT (DISPOSABLE) ×3 IMPLANT
STRIP CLOSURE SKIN 1/2X4 (GAUZE/BANDAGES/DRESSINGS) ×2 IMPLANT
SUCTION FRAZIER HANDLE 10FR (MISCELLANEOUS) ×2
SUCTION TUBE FRAZIER 10FR DISP (MISCELLANEOUS) ×1 IMPLANT
SUT FIBERWIRE #2 38 T-5 BLUE (SUTURE) ×6
SUT MNCRL AB 4-0 PS2 18 (SUTURE) ×3 IMPLANT
SUT VIC AB 0 CT2 27 (SUTURE) ×3 IMPLANT
SUT VIC AB 2-0 CT1 27 (SUTURE) ×3
SUT VIC AB 2-0 CT1 TAPERPNT 27 (SUTURE) ×1 IMPLANT
SUT VICRYL 0 CT 1 36IN (SUTURE) ×3 IMPLANT
SUTURE FIBERWR #2 38 T-5 BLUE (SUTURE) ×2 IMPLANT
SYR CONTROL 10ML LL (SYRINGE) ×3 IMPLANT
TAPE CLOTH SURG 4X10 WHT LF (GAUZE/BANDAGES/DRESSINGS) ×2 IMPLANT
TOWEL OR 17X24 6PK STRL BLUE (TOWEL DISPOSABLE) ×3 IMPLANT
TOWEL OR 17X26 10 PK STRL BLUE (TOWEL DISPOSABLE) ×3 IMPLANT
TOWER CARTRIDGE SMART MIX (DISPOSABLE) IMPLANT
WATER STERILE IRR 1000ML POUR (IV SOLUTION) ×3 IMPLANT
YANKAUER SUCT BULB TIP NO VENT (SUCTIONS) ×3 IMPLANT

## 2016-09-04 NOTE — Brief Op Note (Signed)
09/04/2016  12:11 PM  PATIENT:  Brenda Goodman  83 y.o. female  PRE-OPERATIVE DIAGNOSIS:  Right shoulder failed hemi-arthroplasty, impending stress fracture right acromion  POST-OPERATIVE DIAGNOSIS:  Right shoulder failed hemi-arthroplasty, impending stress fracture right acromion  PROCEDURE:  Procedure(s): RIGHT REVERSE TOTAL SHOULDER ARTHROPLASTY REVISION (Right) Application of contoured plate to right acromion(ORIF) DePuy delta Xtend [employer] Cables x2  SURGEON:  Surgeon(s) and Role:    Netta Cedars, MD - Primary  PHYSICIAN ASSISTANT:   ASSISTANTS: Ventura Bruns, PA-C   ANESTHESIA:   regional and general  EBL:  Total I/O In: 1000 [I.V.:1000] Out: 300 [Blood:300]  BLOOD ADMINISTERED:none  DRAINS: none   LOCAL MEDICATIONS USED:  MARCAINE     SPECIMEN:  No Specimen  DISPOSITION OF SPECIMEN:  N/A  COUNTS:  YES  TOURNIQUET:  * No tourniquets in log *  DICTATION: .Other Dictation: Dictation Number [id number]  PLAN OF CARE: Admit to inpatient   PATIENT DISPOSITION:  PACU - hemodynamically stable.   Delay start of Pharmacological VTE agent (>24hrs) due to surgical blood loss or risk of bleeding: no

## 2016-09-04 NOTE — Anesthesia Procedure Notes (Signed)
Anesthesia Regional Block: Interscalene brachial plexus block   Pre-Anesthetic Checklist: ,, timeout performed, Correct Patient, Correct Site, Correct Laterality, Correct Procedure, Correct Position, site marked, Risks and benefits discussed,  Surgical consent,  Pre-op evaluation,  At surgeon's request and post-op pain management  Laterality: Upper and Right  Prep: chloraprep       Needles:  Injection technique: Single-shot  Needle Type: Echogenic Stimulator Needle          Additional Needles:   Procedures: ultrasound guided,,,,,,,,  Narrative:  Start time: 09/04/2016 7:18 AM End time: 09/04/2016 7:24 AM Injection made incrementally with aspirations every 5 mL.  Performed by: Personally  Anesthesiologist: Etola Mull  Additional Notes: H+P and labs reviewed, risks and benefits discussed with patient, procedure tolerated well without complications

## 2016-09-04 NOTE — Interval H&P Note (Signed)
History and Physical Interval Note:  09/04/2016 7:26 AM  Brenda Goodman  has presented today for surgery, with the diagnosis of Right shoulder failed hemi-arthroplasty  The various methods of treatment have been discussed with the patient and family. After consideration of risks, benefits and other options for treatment, the patient has consented to  Procedure(s): RIGHT REVERSE TOTAL SHOULDER ARTHROPLASTY REVISION (Right) as a surgical intervention .  The patient's history has been reviewed, patient examined, no change in status, stable for surgery.  I have reviewed the patient's chart and labs.  Questions were answered to the patient's satisfaction.     Tatyana Biber,STEVEN R

## 2016-09-04 NOTE — Transfer of Care (Signed)
Immediate Anesthesia Transfer of Care Note  Patient: [name]  Procedure(s) Performed: Procedure(s): RIGHT REVERSE TOTAL SHOULDER ARTHROPLASTY REVISION (Right)  Patient Location: PACU  Anesthesia Type:GA combined with regional for post-op pain  Level of Consciousness: awake, alert  and oriented  Airway & Oxygen Therapy: Patient Spontanous Breathing and Patient connected to nasal cannula oxygen  Post-op Assessment: Report given to RN, Post -op Vital signs reviewed and stable and Patient moving all extremities X 4  Post vital signs: Reviewed and stable  Last Vitals:  Vitals:   09/04/16 0604 09/04/16 1221  BP: 130/64   Pulse: 65   Resp: 18   Temp: 36.6 C (!) (P) 36.3 C    Last Pain:  Vitals:   09/04/16 0604  TempSrc: Oral      Patients Stated Pain Goal: 2 (28/63/81 7711)  Complications: No apparent anesthesia complications

## 2016-09-04 NOTE — Anesthesia Postprocedure Evaluation (Signed)
Anesthesia Post Note  Patient: Brenda Goodman  Procedure(s) Performed: Procedure(s) (LRB): RIGHT REVERSE TOTAL SHOULDER ARTHROPLASTY REVISION (Right)     Patient location during evaluation: PACU Anesthesia Type: General and Regional Level of consciousness: awake and alert Pain management: pain level controlled Vital Signs Assessment: post-procedure vital signs reviewed and stable Respiratory status: spontaneous breathing, nonlabored ventilation, respiratory function stable and patient connected to nasal cannula oxygen Cardiovascular status: blood pressure returned to baseline and stable Postop Assessment: no signs of nausea or vomiting Anesthetic complications: no    Last Vitals:  Vitals:   09/04/16 1315 09/04/16 1338  BP:  (!) 104/59  Pulse: 73   Resp: (!) 25 (!) 21  Temp:      Last Pain:  Vitals:   09/04/16 1250  TempSrc:   PainSc: Asleep                 Kevin Space

## 2016-09-04 NOTE — Discharge Instructions (Signed)
Ice to the shoulder.  Keep the shoulder dressing intact and the incisions dry  Ok to remove the sling while seated and use the right arm for light activity  Follow up in two weeks in the office [phone]

## 2016-09-04 NOTE — Anesthesia Procedure Notes (Addendum)
Procedure Name: Intubation Date/Time: 09/04/2016 7:40 AM Performed by: Kyung Rudd Pre-anesthesia Checklist: Patient identified, Emergency Drugs available, Suction available and Patient being monitored Patient Re-evaluated:Patient Re-evaluated prior to induction Oxygen Delivery Method: Circle system utilized Preoxygenation: Pre-oxygenation with 100% oxygen Induction Type: IV induction Ventilation: Mask ventilation without difficulty Laryngoscope Size: Mac and 3 Grade View: Grade I Tube type: Oral Tube size: 7.0 mm Number of attempts: 1 Airway Equipment and Method: Stylet and Oral airway Placement Confirmation: ETT inserted through vocal cords under direct vision,  positive ETCO2 and breath sounds checked- equal and bilateral Secured at: 20 cm Tube secured with: Tape Dental Injury: Teeth and Oropharynx as per pre-operative assessment

## 2016-09-04 NOTE — Anesthesia Preprocedure Evaluation (Addendum)
Anesthesia Evaluation  Patient identified by MRN, date of birth, ID band Patient awake    Reviewed: Allergy & Precautions, NPO status , Patient's Chart, lab work & pertinent test results  History of Anesthesia Complications (+) PONVNegative for: history of anesthetic complications  Airway Mallampati: I  TM Distance: >3 FB Neck ROM: Full    Dental  (+) Teeth Intact, Dental Advisory Given   Pulmonary neg pulmonary ROS,    breath sounds clear to auscultation       Cardiovascular negative cardio ROS   Rhythm:Regular     Neuro/Psych PSYCHIATRIC DISORDERS Depression negative neurological ROS     GI/Hepatic Neg liver ROS, hiatal hernia, GERD  Controlled and Medicated,  Endo/Other  Hypothyroidism   Renal/GU negative Renal ROS     Musculoskeletal  (+) Arthritis ,   Abdominal   Peds  Hematology  (+) anemia ,   Anesthesia Other Findings   Reproductive/Obstetrics                            Anesthesia Physical Anesthesia Plan  ASA: II  Anesthesia Plan: General and Regional   Post-op Pain Management:  Regional for Post-op pain   Induction: Intravenous  PONV Risk Score and Plan: 4 or greater and Ondansetron, Dexamethasone, Propofol and Treatment may vary due to age or medical condition  Airway Management Planned: Oral ETT  Additional Equipment: None  Intra-op Plan:   Post-operative Plan: Extubation in OR  Informed Consent: I have reviewed the patients History and Physical, chart, labs and discussed the procedure including the risks, benefits and alternatives for the proposed anesthesia with the patient or authorized representative who has indicated his/her understanding and acceptance.   Dental advisory given  Plan Discussed with: CRNA, Anesthesiologist and Surgeon  Anesthesia Plan Comments:        Anesthesia Quick Evaluation

## 2016-09-05 LAB — BASIC METABOLIC PANEL
ANION GAP: 7 (ref 5–15)
BUN: 10 mg/dL (ref 6–20)
CALCIUM: 8.3 mg/dL — AB (ref 8.9–10.3)
CO2: 25 mmol/L (ref 22–32)
Chloride: 106 mmol/L (ref 101–111)
Creatinine, Ser: 0.91 mg/dL (ref 0.44–1.00)
GFR, EST NON AFRICAN AMERICAN: 58 mL/min — AB (ref >60)
Glucose, Bld: 111 mg/dL — ABNORMAL HIGH (ref 65–99)
POTASSIUM: 4.4 mmol/L (ref 3.5–5.1)
SODIUM: 138 mmol/L (ref 135–145)

## 2016-09-05 LAB — HEMOGLOBIN AND HEMATOCRIT, BLOOD
HEMATOCRIT: 30.1 % — AB (ref 36.0–46.0)
Hemoglobin: 9.3 g/dL — ABNORMAL LOW (ref 12.0–15.0)

## 2016-09-05 NOTE — Progress Notes (Signed)
Subjective: 1 Day Post-Op Procedure(s) (LRB): RIGHT REVERSE TOTAL SHOULDER ARTHROPLASTY REVISION (Right) Patient reports pain as minimal. Block has worn off and she feels fine. Wants to go home.    Objective: Vital signs in last 24 hours: Temp:  [97 F (36.1 C)-98.4 F (36.9 C)] 98.2 F (36.8 C) (07/14 0416) Pulse Rate:  [62-96] 62 (07/14 0416) Resp:  [13-25] 18 (07/14 0416) BP: (98-119)/(45-95) 109/50 (07/14 0416) SpO2:  [93 %-100 %] 97 % (07/14 0416)  Intake/Output from previous day: 07/13 0701 - 07/14 0700 In: 1100 [P.O.:100; I.V.:1000] Out: 500 [Urine:200; Blood:300] Intake/Output this shift: No intake/output data recorded.   Recent Labs  09/05/16 0329  HGB 9.3*    Recent Labs  09/05/16 0329  HCT 30.1*    Recent Labs  09/05/16 0329  NA 138  K 4.4  CL 106  CO2 25  BUN 10  CREATININE 0.91  GLUCOSE 111*  CALCIUM 8.3*   No results for input(s): LABPT, INR in the last 72 hours.  Neurologically intact Neurovascular intact No cellulitis present Compartment soft Dressing C/D/I  Assessment/Plan: 1 Day Post-Op Procedure(s) (LRB): RIGHT REVERSE TOTAL SHOULDER ARTHROPLASTY REVISION (Right) Advance diet Up with therapy D/C IV fluids Discharge home with home health  Holland V 09/05/2016, 8:29 AM

## 2016-09-05 NOTE — Discharge Summary (Signed)
Physician Discharge Summary   Patient ID: Brenda Goodman MRN: [id number] DOB/AGE: 06-10-1932 83 y.o.  Admit date: 09/04/2016 Discharge date: 09/05/2016  Primary Diagnosis:  Right shoulder painful hemiarthroplasty with failure of rotator cuff and loss of fixed fulcrum mechanics and pinning stress fracture of right acromion.  Admission Diagnoses:  Past Medical History:  Diagnosis Date  . Anemia    followed by pcp and unconcerned.  . Arthritis   . Cancer (Onset)    skin  . Cough    CHRONIC  . Depression   . Diverticulosis   . Dizziness    possibly cardiac related  . GERD (gastroesophageal reflux disease)   . H/O hiatal hernia   . Hypothyroidism   . PONV (postoperative nausea and vomiting)   . Shortness of breath    COUGH , magnesium helps this  . Wheezing    Discharge Diagnoses:   Active Problems:   S/P shoulder replacement, right  Estimated body mass index is 27.44 kg/m as calculated from the following:   Height as of this encounter: 5' 2" (1.575 m).   Weight as of this encounter: 68 kg (150 lb).  Procedure:  Procedure(s) (LRB): RIGHT REVERSE TOTAL SHOULDER ARTHROPLASTY REVISION (Right)   Consults: None  HPI: The patient is an 83 year old female with worsening right shoulder pain and dysfunction secondary to failure of her rotator cuff following shoulder hemiarthroplasty for fracture 10 years ago.  The patient initially did well.  Recently, she has noted increasing pain and diminished function to the point where she has constant pain including interference with sleep and activities of daily living.  Her x-rays demonstrate a shoulder hemiarthroplasty that is cemented in place with fixed superior head migration and acetabulization of her acromion. Basically, the humeral head component was eroding into the acromion and was basically creating an impending stress fracture.  We discussed options of management including leaving it alone and just modifying activities  versus revising to a reverse shoulder replacement.  My concern about the reverse would be placing all of the strain for the operation of the reverse arthroplasty on the acromion, which already is in a weakened state.  I did recommend prophylactic open reduction and internal fixation of the impending fracture that she was getting ready to have.  She agreed to that and that would stabilize and strengthen that bone.  We discussed all other options for management including pain management, but given how this is interfering with her independence and activities of daily living, she did wish to proceed with surgery.  Risks and benefits of surgery were discussed.   Laboratory Data: Admission on 09/04/2016  Component Date Value Ref Range Status  . Hemoglobin 09/05/2016 9.3* 12.0 - 15.0 g/dL Final  . HCT 09/05/2016 30.1* 36.0 - 46.0 % Final  . Sodium 09/05/2016 138  135 - 145 mmol/L Final  . Potassium 09/05/2016 4.4  3.5 - 5.1 mmol/L Final  . Chloride 09/05/2016 106  101 - 111 mmol/L Final  . CO2 09/05/2016 25  22 - 32 mmol/L Final  . Glucose, Bld 09/05/2016 111* 65 - 99 mg/dL Final  . BUN 09/05/2016 10  6 - 20 mg/dL Final  . Creatinine, Ser 09/05/2016 0.91  0.44 - 1.00 mg/dL Final  . Calcium 09/05/2016 8.3* 8.9 - 10.3 mg/dL Final  . GFR calc non Af Amer 09/05/2016 58* >60 mL/min Final  . GFR calc Af Amer 09/05/2016 >60  >60 mL/min Final   Comment: (NOTE) The eGFR has been calculated using the CKD  EPI equation. This calculation has not been validated in all clinical situations. eGFR's persistently <60 mL/min signify possible Chronic Kidney Disease.   Georgiann Hahn gap 09/05/2016 7  5 - 15 Final  Hospital Outpatient Visit on 08/24/2016  Component Date Value Ref Range Status  . MRSA, PCR 08/24/2016 NEGATIVE  NEGATIVE Final  . Staphylococcus aureus 08/24/2016 NEGATIVE  NEGATIVE Final   Comment:        The Xpert SA Assay (FDA approved for NASAL specimens in patients over 40 years of age), is one  component of a comprehensive surveillance program.  Test performance has been validated by [facility] for patients greater than or equal to 27 year old. It is not intended to diagnose infection nor to guide or monitor treatment.   . Sodium 08/24/2016 137  135 - 145 mmol/L Final  . Potassium 08/24/2016 4.3  3.5 - 5.1 mmol/L Final  . Chloride 08/24/2016 103  101 - 111 mmol/L Final  . CO2 08/24/2016 26  22 - 32 mmol/L Final  . Glucose, Bld 08/24/2016 90  65 - 99 mg/dL Final  . BUN 08/24/2016 11  6 - 20 mg/dL Final  . Creatinine, Ser 08/24/2016 0.85  0.44 - 1.00 mg/dL Final  . Calcium 08/24/2016 9.9  8.9 - 10.3 mg/dL Final  . GFR calc non Af Amer 08/24/2016 >60  >60 mL/min Final  . GFR calc Af Amer 08/24/2016 >60  >60 mL/min Final   Comment: (NOTE) The eGFR has been calculated using the CKD EPI equation. This calculation has not been validated in all clinical situations. eGFR's persistently <60 mL/min signify possible Chronic Kidney Disease.   . Anion gap 08/24/2016 8  5 - 15 Final  . WBC 08/24/2016 8.1  4.0 - 10.5 K/uL Final  . RBC 08/24/2016 4.51  3.87 - 5.11 MIL/uL Final  . Hemoglobin 08/24/2016 13.2  12.0 - 15.0 g/dL Final  . HCT 08/24/2016 41.4  36.0 - 46.0 % Final  . MCV 08/24/2016 91.8  78.0 - 100.0 fL Final  . MCH 08/24/2016 29.3  26.0 - 34.0 pg Final  . MCHC 08/24/2016 31.9  30.0 - 36.0 g/dL Final  . RDW 08/24/2016 13.6  11.5 - 15.5 % Final  . Platelets 08/24/2016 276  150 - 400 K/uL Final  . ABO/RH(D) 08/24/2016 O NEG   Final  . Antibody Screen 08/24/2016 NEG   Final  . Sample Expiration 08/24/2016 09/07/2016   Final  . Extend sample reason 08/24/2016 NO TRANSFUSIONS OR PREGNANCY IN THE PAST 3 MONTHS   Final     X-Rays:Ct Shoulder Right Wo Contrast  Result Date: 08/10/2016 CLINICAL DATA:  Chronic right shoulder pain and limited range of motion developing over the past 8 years. History of shoulder replacement. EXAM: CT OF THE UPPER RIGHT EXTREMITY WITHOUT CONTRAST  TECHNIQUE: Multidetector CT imaging of the upper right extremity was performed according to the standard protocol. COMPARISON:  None. FINDINGS: Bones/Joint/Cartilage The patient has a left shoulder arthroplasty. The device is located. The device is high-riding and abuts the undersurface of the acromion. The glenoid is flattened and remodeled. There is no lucency about the humeral component to suggest infection or lucency. There is only mild acromioclavicular degenerative change. Ligaments Suboptimally assessed by CT. Muscles and Tendons There is mild to moderate fatty atrophy of the supraspinatus. Abutment of the patient's arthroplasty with the acromion is worrisome for supraspinatus tear although the tear is not visualized on this examination. Soft tissues No fracture or focal lesion. Imaged lung parenchyma  is clear. Atherosclerosis is noted. IMPRESSION: Right shoulder replacement in place. The device is high-riding and abuts the acromion worrisome for supraspinatus tear although retracted tendon is not seen. There is mild to moderate supraspinatus atrophy. The glenoid is remodeled. Negative for fracture or evidence of hardware loosening. 3-dimensional CT images were rendered by post-processing of the original CT data at the CT scanner. The 3-dimensional CT images were interpreted, and findings were reported in the accompanying complete CT report for this study. Electronically Signed   By: Inge Rise M.D.   On: 08/10/2016 14:47   Ct 3d Independent Wkst  Result Date: 08/10/2016 CLINICAL DATA:  Chronic right shoulder pain and limited range of motion developing over the past 8 years. History of shoulder replacement. EXAM: CT OF THE UPPER RIGHT EXTREMITY WITHOUT CONTRAST TECHNIQUE: Multidetector CT imaging of the upper right extremity was performed according to the standard protocol. COMPARISON:  None. FINDINGS: Bones/Joint/Cartilage The patient has a left shoulder arthroplasty. The device is located. The  device is high-riding and abuts the undersurface of the acromion. The glenoid is flattened and remodeled. There is no lucency about the humeral component to suggest infection or lucency. There is only mild acromioclavicular degenerative change. Ligaments Suboptimally assessed by CT. Muscles and Tendons There is mild to moderate fatty atrophy of the supraspinatus. Abutment of the patient's arthroplasty with the acromion is worrisome for supraspinatus tear although the tear is not visualized on this examination. Soft tissues No fracture or focal lesion. Imaged lung parenchyma is clear. Atherosclerosis is noted. IMPRESSION: Right shoulder replacement in place. The device is high-riding and abuts the acromion worrisome for supraspinatus tear although retracted tendon is not seen. There is mild to moderate supraspinatus atrophy. The glenoid is remodeled. Negative for fracture or evidence of hardware loosening. 3-dimensional CT images were rendered by post-processing of the original CT data at the CT scanner. The 3-dimensional CT images were interpreted, and findings were reported in the accompanying complete CT report for this study. Electronically Signed   By: Inge Rise M.D.   On: 08/10/2016 14:47   Dg Shoulder Right Port  Result Date: 09/04/2016 CLINICAL DATA:  S/p right shoulder replacement EXAM: PORTABLE RIGHT SHOULDER COMPARISON:  CT 08/10/2016 FINDINGS: Revision right shoulder arthroplasty components project in expected location on this single image. No fracture or other apparent complication. IMPRESSION: 1. Revision right shoulder arthroplasty without apparent complication on this single projection. Electronically Signed   By: Lucrezia Europe M.D.   On: 09/04/2016 13:22    EKG: Orders placed or performed during the hospital encounter of 09/04/16  . EKG 12-Lead  . EKG 12-Lead     Hospital Course: SHUNA TABOR is a 83 y.o. who was admitted to [facility]. They were brought to the  operating room on 09/04/2016 and underwent Procedure(s): RIGHT REVERSE TOTAL SHOULDER ARTHROPLASTY REVISION.  Patient tolerated the procedure well and was later transferred to the recovery room and then to the orthopaedic floor for postoperative care.  They were given PO and IV analgesics for pain control following their surgery.  They were given 24 hours of postoperative antibiotics of  Anti-infectives    Start     Dose/Rate Route Frequency Ordered Stop   09/04/16 1445  ceFAZolin (ANCEF) IVPB 2g/100 mL premix     2 g 200 mL/hr over 30 Minutes Intravenous Every 6 hours 09/04/16 1424 09/05/16 0406   09/04/16 0553  ceFAZolin (ANCEF) IVPB 2g/100 mL premix     2 g 200 mL/hr over  30 Minutes Intravenous On call to O.R. 09/04/16 8527 09/04/16 0745     PT and OT were ordered for postop therapy protocol.  Discharge planning consulted to help with postop disposition and equipment needs.  Patient had a good night on the evening of surgery until the block wore off.  They started to get up OOB with therapy on day one. Patient was seen in rounds on day one and it was felt that as long as they did well with  therapy that they would be ready to go home.  Arrangements were made and they were setup to go home on POD 1.  Diet - Regular diet Follow up - in 2 weeks Activity - up ad lib Dressing - May remove the surgical dressing tomorrow at home and then apply a dry gauze dressing daily. May shower three days following surgery but do not submerge the incision under water. Disposition - Home Condition Upon Discharge - Stable   Allergies as of 09/05/2016      Reactions   No Known Allergies       Medication List    TAKE these medications   acetaminophen 500 MG tablet Commonly known as:  TYLENOL Take 1,000 mg by mouth at bedtime as needed for moderate pain.   CALCIUM 600 + D PO Take 1 tablet by mouth daily.   cholecalciferol 1000 units tablet Commonly known as:  VITAMIN D Take 1,000 Units by mouth  daily.   colestipol 1 g tablet Commonly known as:  COLESTID Take 2 g by mouth 2 (two) times daily.   FLUoxetine 10 MG capsule Commonly known as:  PROZAC Take 10 mg by mouth daily.   fluticasone 50 MCG/ACT nasal spray Commonly known as:  FLONASE Place 1 spray into both nostrils 2 (two) times daily as needed for allergies or rhinitis.   HYDROcodone-acetaminophen 5-325 MG tablet Commonly known as:  NORCO Take 1 tablet by mouth every 6 (six) hours as needed for moderate pain.   levothyroxine 125 MCG tablet Commonly known as:  SYNTHROID, LEVOTHROID Take 125 mcg by mouth daily before breakfast.   magnesium oxide 400 MG tablet Commonly known as:  MAG-OX Take 400 mg by mouth daily.   omeprazole 20 MG capsule Commonly known as:  PRILOSEC Take 20 mg by mouth daily.   SLOW RELEASE IRON PO Take 1 tablet by mouth daily.   zoledronic acid 5 MG/100ML Soln injection Commonly known as:  RECLAST Inject 5 mg into the vein once. Took once a year, MD informed pt she would not need until 2 years      Follow-up Information    Netta Cedars, MD. Call in 2 week(s).   Specialty:  Orthopedic Surgery Why:  [phone] Contact information: [address] [place] [address] [phone]           Signed: Arlee Muslim, PA-C Orthopaedic Surgery 09/05/2016, 8:58 AM

## 2016-09-05 NOTE — Evaluation (Signed)
Physical Therapy Evaluation and Discharge Patient Details Name: Brenda Goodman MRN: [id number] DOB: Jul 02, 1932 Today's Date: 09/05/2016   History of Present Illness  83 y.o. female s/p RIGHT REVERSE TOTAL SHOULDER ARTHROPLASTY REVISION.  Clinical Impression  Patient evaluated by Physical Therapy with no further acute PT needs identified. All education has been completed and the patient has no further questions. Safely navigates steps with single rail on Lt. Pt will have supervision for safety with husband at home. Reports Hx of balance impairment which I feel she would greatly benefit from outpatient PT to prevent decline or fall resulting in injury. This was discussed thoroughly with pt and husband. Otherwise, they feel comfortable with caring for pt at home. Also highly recommended wearing flat shoes rather than wedged shoes at home. See below for any follow-up Physical Therapy or equipment needs. PT is signing off. Thank you for this referral.     Follow Up Recommendations Outpatient PT (for balance, discussed and encouraged with pt and husband)    Equipment Recommendations  None recommended by PT    Recommendations for Other Services       Precautions / Restrictions Precautions Precautions: Shoulder Shoulder Interventions: Shoulder sling/immobilizer Precaution Comments: Reviewed precautions Required Braces or Orthoses: Sling Restrictions Weight Bearing Restrictions: Yes RUE Weight Bearing: Non weight bearing      Mobility  Bed Mobility Overal bed mobility: Modified Independent Bed Mobility: Supine to Sit     Supine to sit: Modified independent (Device/Increase time)     General bed mobility comments: Extra time, appropriate use of LUE  Transfers Overall transfer level: Needs assistance Equipment used: Straight cane Transfers: Sit to/from Stand Sit to Stand: Min guard         General transfer comment: Min guard for safety. Slow to rise with moderate sway but  able to self correct without physical assist. Cues for technique.  Ambulation/Gait Ambulation/Gait assistance: Min guard Ambulation Distance (Feet): 175 Feet Assistive device: None;Straight cane Gait Pattern/deviations: Step-through pattern;Drifts right/left;Scissoring Gait velocity: decreased Gait velocity interpretation: <1.8 ft/sec, indicative of risk for recurrent falls General Gait Details: Close guard for safety initially. Improved gait with distance however intially showing drift to L/R and scissoring intermittently. Cues for awareness, safety, and appropriate use of cane.  Stairs Stairs: Yes Stairs assistance: Supervision Stair Management: One rail Left;Step to pattern Number of Stairs: 4 (x2) General stair comments: Educatd on safe sequencing and rail use while navigating steps. Husband present and observed guard techniques. No assist required physically.  Wheelchair Mobility    Modified Rankin (Stroke Patients Only)       Balance Overall balance assessment: Needs assistance Sitting-balance support: No upper extremity supported;Feet supported Sitting balance-Leahy Scale: Good     Standing balance support: No upper extremity supported Standing balance-Leahy Scale: Fair                               Pertinent Vitals/Pain Pain Assessment: No/denies pain    Home Living Family/patient expects to be discharged to:: Private residence Living Arrangements: Spouse/significant other Available Help at Discharge: Family;Available 24 hours/day Type of Home: House Home Access: Stairs to enter Entrance Stairs-Rails: Chemical engineer of Steps: 4   Home Equipment: Kasandra Knudsen - quad;Cane - single point      Prior Function Level of Independence: Independent with assistive device(s)         Comments: Husband states he provides supervision with gait.     Hand Dominance  Extremity/Trunk Assessment   Upper Extremity Assessment Upper  Extremity Assessment: Defer to OT evaluation    Lower Extremity Assessment Lower Extremity Assessment: Generalized weakness       Communication   Communication: No difficulties  Cognition Arousal/Alertness: Awake/alert Behavior During Therapy: WFL for tasks assessed/performed Overall Cognitive Status: Within Functional Limits for tasks assessed                                        General Comments General comments (skin integrity, edema, etc.): Husband reports long standing history of imbalance with gait, has been seen by neurologists in the past with little resolution of symptoms.    Exercises     Assessment/Plan    PT Assessment Patent does not need any further PT services  PT Problem List         PT Treatment Interventions      PT Goals (Current goals can be found in the Care Plan section)  Acute Rehab PT Goals Patient Stated Goal: go home soon PT Goal Formulation: All assessment and education complete, DC therapy    Frequency     Barriers to discharge        Co-evaluation               AM-PAC PT "6 Clicks" Daily Activity  Outcome Measure Difficulty turning over in bed (including adjusting bedclothes, sheets and blankets)?: None Difficulty moving from lying on back to sitting on the side of the bed? : A Little Difficulty sitting down on and standing up from a chair with arms (e.g., wheelchair, bedside commode, etc,.)?: A Little Help needed moving to and from a bed to chair (including a wheelchair)?: A Little Help needed walking in hospital room?: A Little Help needed climbing 3-5 steps with a railing? : None 6 Click Score: 20    End of Session Equipment Utilized During Treatment: Gait belt (sling) Activity Tolerance: Patient tolerated treatment well Patient left: in bed;with call bell/phone within reach;with family/visitor present Nurse Communication: Mobility status PT Visit Diagnosis: Unsteadiness on feet (R26.81)    Time:  6226-3335 PT Time Calculation (min) (ACUTE ONLY): 13 min   Charges:   PT Evaluation $PT Eval Low Complexity: 1 Procedure     PT G Codes:        Elayne Snare, PT, DPT  Ellouise Newer 09/05/2016, 12:16 PM

## 2016-09-05 NOTE — Evaluation (Signed)
Occupational Therapy Evaluation Patient Details Name: Brenda Goodman MRN: [id number] DOB: 07-19-1932 Today's Date: 09/05/2016    History of Present Illness 83 y.o. female s/p RIGHT REVERSE TOTAL SHOULDER ARTHROPLASTY REVISION.   Clinical Impression   Patient evaluated by Occupational Therapy with no further acute OT needs identified. All education has been completed and the patient has no further questions. See below for any follow-up Occupational Therapy or equipment needs. OT to sign off. Thank you for referral.      Follow Up Recommendations  DC plan and follow up therapy as arranged by surgeon    Equipment Recommendations  None recommended by OT    Recommendations for Other Services       Precautions / Restrictions Precautions Precautions: Shoulder Type of Shoulder Precautions: conservative protocol Shoulder Interventions: Off for dressing/bathing/exercises Precaution Comments: handout provided and reviewed in detal Required Braces or Orthoses: Sling Restrictions Weight Bearing Restrictions: Yes RUE Weight Bearing: Non weight bearing      Mobility Bed Mobility Overal bed mobility: Modified Independent Bed Mobility: Supine to Sit     Supine to sit: Modified independent (Device/Increase time)     General bed mobility comments: exit on L side  Transfers Overall transfer level: Needs assistance Equipment used: 1 person hand held assist Transfers: Sit to/from Stand Sit to Stand: Min assist         General transfer comment: pt relies on therapist for support    Balance Overall balance assessment: Needs assistance Sitting-balance support: No upper extremity supported;Feet supported Sitting balance-Leahy Scale: Good     Standing balance support: No upper extremity supported Standing balance-Leahy Scale: Fair                             ADL either performed or assessed with clinical judgement   ADL Overall ADL's : Needs  assistance/impaired Eating/Feeding: Set up   Grooming: Wash/dry face;Minimal assistance;Cueing for safety;Standing Grooming Details (indicate cue type and reason): pt with lob x3 attempting makeup. educated on use of R and L UE together to safely use R LE with minimal weight  Upper Body Bathing: Minimal assistance   Lower Body Bathing: Minimal assistance           Toilet Transfer: Minimal assistance           Functional mobility during ADLs: Minimal assistance General ADL Comments: pt with LOb during session adn recommended PT address with a cane. pt has a cane at baseline. pt encouraged to have spouse support upon d/ cto prevent fall  Pt educated on bathing and avoid washing directly on incision. Pt educated to use new wash cloth and towel each day. Pt educated to allow water to run across dressing and not to soak in a tub at this time. RN placed aqua seal dressing over wound.   Pt completed shoulder PROM FF 60 degrees and abduction 60 degrees. Pt with handout and educated on positioning.     Vision   Vision Assessment?: No apparent visual deficits     Perception     Praxis      Pertinent Vitals/Pain Pain Assessment: Faces Faces Pain Scale: Hurts little more Pain Location: R shoudler Pain Descriptors / Indicators: Operative site guarding Pain Intervention(s): Repositioned;Premedicated before session;Monitored during session;Limited activity within patient's tolerance     Hand Dominance Right   Extremity/Trunk Assessment Upper Extremity Assessment Upper Extremity Assessment: RUE deficits/detail RUE Deficits / Details: s/p surg   Lower Extremity Assessment  Lower Extremity Assessment: Defer to PT evaluation   Cervical / Trunk Assessment Cervical / Trunk Assessment: Kyphotic   Communication Communication Communication: No difficulties   Cognition Arousal/Alertness: Awake/alert Behavior During Therapy: WFL for tasks assessed/performed Overall Cognitive Status:  Impaired/Different from baseline Area of Impairment: Safety/judgement                         Safety/Judgement: Decreased awareness of deficits     General Comments: pt wanting to wear wedge shoes and with x3 LOB attempting makeup but insist on application . These doctors have been seeing me and i look horrible   General Comments  spouse present and to give 24/7 (A)    Exercises     Shoulder Instructions      Home Living Family/patient expects to be discharged to:: Private residence Living Arrangements: Spouse/significant other Available Help at Discharge: Family;Available 24 hours/day Type of Home: House Home Access: Stairs to enter [employer] of Steps: 4 Entrance Stairs-Rails: Left;Right       Bathroom Shower/Tub: Teacher, early years/pre: Standard     Home Equipment: Administrator, sports - single point   Additional Comments: pt reports LOB at baseline adn reports embarrassment for walking like a "drunk"      Prior Functioning/Environment Level of Independence: Independent with assistive device(s)        Comments: Husband states he provides supervision with gait.        OT Problem List:        OT Treatment/Interventions:      OT Goals(Current goals can be found in the care plan section) Acute Rehab OT Goals Patient Stated Goal: to put on some makeup  OT Frequency:     Barriers to D/C:            Co-evaluation              AM-PAC PT "6 Clicks" Daily Activity     Outcome Measure Help from another person eating meals?: None Help from another person taking care of personal grooming?: A Little Help from another person toileting, which includes using toliet, bedpan, or urinal?: A Little Help from another person bathing (including washing, rinsing, drying)?: A Little Help from another person to put on and taking off regular upper body clothing?: A Little Help from another person to put on and taking off regular lower  body clothing?: A Little 6 Click Score: 19   End of Session Equipment Utilized During Treatment: Gait belt;Other (comment) (sling) Nurse Communication: Mobility status;Precautions;Weight bearing status  Activity Tolerance: Patient tolerated treatment well Patient left: in bed;with call bell/phone within reach;with family/visitor present  OT Visit Diagnosis: Unsteadiness on feet (R26.81)                Time: 9574-7340 OT Time Calculation (min): 72 min Charges:  OT General Charges $OT Visit: 1 Procedure OT Evaluation $OT Eval Moderate Complexity: 1 Procedure OT Treatments $Self Care/Home Management : 53-67 mins G-Codes:      Jeri Modena   OTR/L Pager: [phone] Office: [phone] .   Parke Poisson B 09/05/2016, 1:24 PM

## 2016-09-05 NOTE — Progress Notes (Signed)
Pt ready for discharge to home. Husband here to accompany. All personal belongings with pt. Sling to right arm in use. Pt demonstrates no s/sx of distress. All discharge instructions and rx reviewed with pt

## 2016-09-07 ENCOUNTER — Encounter (HOSPITAL_COMMUNITY): Payer: Self-pay | Admitting: Orthopedic Surgery

## 2016-09-07 NOTE — Op Note (Signed)
NAME:  MEOSHIA, BILLING NO.:  MEDICAL RECORD NO.:  [id number]  LOCATION:                                 FACILITY:  PHYSICIAN:  Doran Heater. Veverly Fells, M.D.      DATE OF BIRTH:  DATE OF PROCEDURE:  09/04/2016 DATE OF DISCHARGE:                              OPERATIVE REPORT   PREOPERATIVE DIAGNOSIS:  Right shoulder painful hemiarthroplasty with failure of rotator cuff and loss of fixed fulcrum mechanics and pinning stress fracture of right acromion.  POSTOPERATIVE DIAGNOSIS:  Right shoulder painful hemiarthroplasty with failure of rotator cuff and loss of fixed fulcrum mechanics and pinning stress fracture of right acromion.  PROCEDURE PERFORMED:  Removal of right shoulder hemiarthroplasty with revision to reverse total shoulder arthroplasty using the DePuy Delta Xtend prosthesis and prophylactic open reduction and internal fixation of right acromion impending stress fracture.  ATTENDING SURGEON:  Doran Heater. Veverly Fells, M.D.  ASSISTANT:  Charletta Cousin Dexter, [facility], who scrubbed the entire procedure and necessary for satisfactory completion of surgery.  ANESTHESIA:  General anesthesia was used plus interscalene blocks.  ESTIMATED BLOOD LOSS:  300 mL.  FLUID REPLACEMENT:  1000 mL of crystalloid.  INSTRUMENT COUNTS:  Correct.  COMPLICATIONS:  There were no complications.  PROPHYLAXIS:  Perioperative antibiotics were given.  INDICATIONS:  The patient is an 83 year old female with worsening right shoulder pain and dysfunction secondary to failure of her rotator cuff following shoulder hemiarthroplasty for fracture 10 years ago.  The patient initially did well.  Recently, she has noted increasing pain and diminished function to the point where she has constant pain including interference with sleep and activities of daily living.  Her x-rays demonstrate a shoulder hemiarthroplasty that is cemented in place with fixed superior head migration and acetabulization of  her acromion. Basically, the humeral head component was eroding into the acromion and was basically creating an impending stress fracture.  We discussed options of management including leaving it alone and just modifying activities versus revising to a reverse shoulder replacement.  My concern about the reverse would be placing all of the strain for the operation of the reverse arthroplasty on the acromion, which already is in a weakened state.  I did recommend prophylactic open reduction and internal fixation of the impending fracture that she was getting ready to have.  She agreed to that and that would stabilize and strengthen that bone.  We discussed all other options for management including pain management, but given how this is interfering with her independence and activities of daily living, she did wish to proceed with surgery.  Risks and benefits of surgery were discussed.  Informed consent obtained.  DESCRIPTION OF PROCEDURE:  After an adequate level of anesthesia was achieved, the patient was positioned in the modified beach-chair position.  Right shoulder correctly identified, sterilely prepped and draped in usual manner.  Time-out called.  We started our approach to the shoulder using an anterior deltopectoral incision.  This was the patient's prior incision.  We opened that up with a 10 blade scalpel, dissection down through subcutaneous tissues using Bovie.  We identified the deltopectoral interval and developed that with a needle-tip Bovie.  We developed the interval between the deltoid and the underlying humerus.  Humerus was superiorly migrated and the humeral head fixed up against the acromion.  I was able to release the subscapularis remnant off the lesser tuberosity.  I looked back to her tuberosity.  It healed fine.  All of her sutures were intact and soft tissue was intact, but just became nonfunctional.  Once we did a soft tissue peel on the medial side, we were  able to extend the shoulder and get the head off which we used an osteotome to free the Morse taper up and then a Cobb elevator to lift the head out.  There was no evidence for any infection at all.  At this point, we went to the acromial side, did a hockey-stick incision posteriorly overlying the subcutaneous scapular spine and over the acromion.  Dissection down through subcutaneous tissues with the Bovie, cut down to the periosteum.  We then went ahead and selected a cluster plate off the Acumed contoured clavicle plate set.  We then bent that to the appropriate shape for the acromion, laid that in the proper position and then placed 3.5 screws into the scapular spine and then 2.7 screws into the cluster holes there on the acromion itself.  We did not place screws in the 2 corner screw holes as we did not feel like we needed them for security of the plate and we did not want to create a stress rise from the bone there as it was at the high stress area.  We irrigated thoroughly, closed that with deep 0 Vicryl layer for muscular fascia and then 2-0 Vicryl subcu and 4-0 Monocryl for skin.  We then went back to the humeral side and we started working with osteotomes proximally to free up the implant-cement interface.  We used flexible osteotomes for that.  We did lose some bone proximally.  We used a mallet trying to impact the stem down to break the cement mantle and then go up superiorly.  We did this for at least 30 minutes.  We also did additional osteotomes.  I wound up having exposure of the proximal half of the stem and still could not get the stem to come out despite vigorous hitting with a flap hammer and at this point it felt like we could better control the humerus and damage to the humerus if we did an osteotomy, so we did go and call for some Stryker cables which we were able to pass around the humerus more distally, we extended our incision. Once we had our cables passed, then  we used an oscillating saw to cut down the anterior aspect of the bone just medial to the deltoid insertion on the lateral ridge of the humerus.  Once we had that cut, we just used an osteotome to spread gently and I was able to then impact the stem out.  We did decide based on the chance of that we tried to remove all of her cement that we could destroy the entire humeral shaft. We elected to go ahead and proceed with cementing inside the patient's cement mantle.  So, we took the cables and tightened the cables up and crimped those, cut away the remaining cable.  We had nice reconstitution of the humeral canal at least through about half of the stem itself.  We then trialed with an 8 cemented monoblock stem and reduced the shoulder with a 38+ 3 trial.  We knew that was going to  work fine.  We then went over to the glenoid face.  There was extensive scar tissue on the glenoid.  As there had been noted articulation there for a while, we were careful to protect the axillary musculocutaneous nerves.  We removed the scar tissue from about the glenoid vault.  We then went ahead and removed remaining cartilage with a curette and Cobb elevator. We then found our center point on our glenoid.  Once we were down to bone and drilled our guide pin, we then reamed for the metaglene, drilled our central peg hole for the metaglene and impacted the metaglene into position, 48 screw inferiorly, 30 screw at the base of the coracoid and 18 nonlocked posteriorly, had excellent base plate fixation and stability.  We then placed a 38 standard glenosphere into position, screwed that home and impacted to screw it again so was in place.  We carefully checked the axillary nerve, which was free and clear.  Next, we did our trial reduction with our stem, set on 0 degrees and reduced the shoulder and felt like we had appropriate height, just laying the stem down on the pedestal at the base of the cement mantle. We  then scratched the inside of the cement to get some architecture inside for the cement to interdigitate into.  We then thoroughly dried after thorough irrigation and then mixed DePuy high viscosity cement and cemented our size 8 monoblock stem into position at 0 degrees.  Once the cement was allowed to harden, we reduced it again with 38+ 3 trial.  We selected that as our real implant and impacted our 38+ 3 trial.  We had about half of the stem exposed.  The great tuberosity could not be repaired.  The lesser tuberosity also was sacrificed.  We were able to repair the latissimus on some bone and repaired that with FiberWire suture around the patient's native humeral shaft.  We felt like we had a good stable repair, conjoined tight, no gapping with inferior pole or external rotation, and we irrigated thoroughly and closed the deltopectoral interval with 0 Vicryl suture followed by 2-0 Vicryl for subcutaneous closure and 4-0 Monocryl for skin.  Steri-Strips were applied followed by sterile dressing.  The patient tolerated the surgery well.     Doran Heater. Veverly Fells, M.D.     SRN/MEDQ  D:  09/04/2016  T:  09/04/2016  Job:  [id number]

## 2016-09-17 DIAGNOSIS — Z471 Aftercare following joint replacement surgery: Secondary | ICD-10-CM | POA: Diagnosis not present

## 2016-09-17 DIAGNOSIS — Z96611 Presence of right artificial shoulder joint: Secondary | ICD-10-CM | POA: Diagnosis not present

## 2016-10-01 DIAGNOSIS — Z471 Aftercare following joint replacement surgery: Secondary | ICD-10-CM | POA: Diagnosis not present

## 2016-10-01 DIAGNOSIS — Z96611 Presence of right artificial shoulder joint: Secondary | ICD-10-CM | POA: Diagnosis not present

## 2016-10-16 DIAGNOSIS — H02052 Trichiasis without entropian right lower eyelid: Secondary | ICD-10-CM | POA: Diagnosis not present

## 2016-10-29 DIAGNOSIS — Z471 Aftercare following joint replacement surgery: Secondary | ICD-10-CM | POA: Diagnosis not present

## 2016-10-29 DIAGNOSIS — Z96611 Presence of right artificial shoulder joint: Secondary | ICD-10-CM | POA: Diagnosis not present

## 2016-11-05 DIAGNOSIS — L508 Other urticaria: Secondary | ICD-10-CM | POA: Diagnosis not present

## 2016-11-05 DIAGNOSIS — J301 Allergic rhinitis due to pollen: Secondary | ICD-10-CM | POA: Diagnosis not present

## 2016-11-09 DIAGNOSIS — M545 Low back pain: Secondary | ICD-10-CM | POA: Diagnosis not present

## 2016-11-09 DIAGNOSIS — M5441 Lumbago with sciatica, right side: Secondary | ICD-10-CM | POA: Diagnosis not present

## 2016-11-09 DIAGNOSIS — R1031 Right lower quadrant pain: Secondary | ICD-10-CM | POA: Diagnosis not present

## 2016-11-09 DIAGNOSIS — M25551 Pain in right hip: Secondary | ICD-10-CM | POA: Diagnosis not present

## 2016-12-01 ENCOUNTER — Other Ambulatory Visit: Payer: Self-pay | Admitting: Otolaryngology

## 2016-12-01 ENCOUNTER — Ambulatory Visit
Admission: RE | Admit: 2016-12-01 | Discharge: 2016-12-01 | Disposition: A | Discharge status: Home / Self Care | Payer: PPO | Source: Ambulatory Visit | Attending: Otolaryngology | Admitting: Otolaryngology

## 2016-12-01 DIAGNOSIS — R05 Cough: Secondary | ICD-10-CM

## 2016-12-01 DIAGNOSIS — J019 Acute sinusitis, unspecified: Secondary | ICD-10-CM | POA: Diagnosis not present

## 2016-12-01 DIAGNOSIS — K449 Diaphragmatic hernia without obstruction or gangrene: Secondary | ICD-10-CM | POA: Diagnosis not present

## 2016-12-01 DIAGNOSIS — R059 Cough, unspecified: Secondary | ICD-10-CM

## 2016-12-03 DIAGNOSIS — Z0000 Encounter for general adult medical examination without abnormal findings: Secondary | ICD-10-CM | POA: Diagnosis not present

## 2016-12-03 DIAGNOSIS — R002 Palpitations: Secondary | ICD-10-CM | POA: Diagnosis not present

## 2016-12-03 DIAGNOSIS — R7309 Other abnormal glucose: Secondary | ICD-10-CM | POA: Diagnosis not present

## 2016-12-03 DIAGNOSIS — E039 Hypothyroidism, unspecified: Secondary | ICD-10-CM | POA: Diagnosis not present

## 2016-12-03 DIAGNOSIS — N183 Chronic kidney disease, stage 3 (moderate): Secondary | ICD-10-CM | POA: Diagnosis not present

## 2016-12-03 DIAGNOSIS — K219 Gastro-esophageal reflux disease without esophagitis: Secondary | ICD-10-CM | POA: Diagnosis not present

## 2016-12-03 DIAGNOSIS — M810 Age-related osteoporosis without current pathological fracture: Secondary | ICD-10-CM | POA: Diagnosis not present

## 2016-12-07 DIAGNOSIS — L820 Inflamed seborrheic keratosis: Secondary | ICD-10-CM | POA: Diagnosis not present

## 2016-12-07 DIAGNOSIS — L578 Other skin changes due to chronic exposure to nonionizing radiation: Secondary | ICD-10-CM | POA: Diagnosis not present

## 2016-12-07 DIAGNOSIS — D485 Neoplasm of uncertain behavior of skin: Secondary | ICD-10-CM | POA: Diagnosis not present

## 2016-12-07 DIAGNOSIS — C44712 Basal cell carcinoma of skin of right lower limb, including hip: Secondary | ICD-10-CM | POA: Diagnosis not present

## 2016-12-07 DIAGNOSIS — L821 Other seborrheic keratosis: Secondary | ICD-10-CM | POA: Diagnosis not present

## 2016-12-09 DIAGNOSIS — R319 Hematuria, unspecified: Secondary | ICD-10-CM | POA: Diagnosis not present

## 2016-12-17 DIAGNOSIS — Z0000 Encounter for general adult medical examination without abnormal findings: Secondary | ICD-10-CM | POA: Diagnosis not present

## 2016-12-17 DIAGNOSIS — D649 Anemia, unspecified: Secondary | ICD-10-CM | POA: Diagnosis not present

## 2016-12-17 DIAGNOSIS — R7303 Prediabetes: Secondary | ICD-10-CM | POA: Diagnosis not present

## 2016-12-17 DIAGNOSIS — N182 Chronic kidney disease, stage 2 (mild): Secondary | ICD-10-CM | POA: Diagnosis not present

## 2016-12-17 DIAGNOSIS — M810 Age-related osteoporosis without current pathological fracture: Secondary | ICD-10-CM | POA: Diagnosis not present

## 2016-12-24 DIAGNOSIS — M810 Age-related osteoporosis without current pathological fracture: Secondary | ICD-10-CM | POA: Diagnosis not present

## 2016-12-31 DIAGNOSIS — Z96611 Presence of right artificial shoulder joint: Secondary | ICD-10-CM | POA: Diagnosis not present

## 2016-12-31 DIAGNOSIS — Z471 Aftercare following joint replacement surgery: Secondary | ICD-10-CM | POA: Diagnosis not present

## 2017-01-19 DIAGNOSIS — F331 Major depressive disorder, recurrent, moderate: Secondary | ICD-10-CM | POA: Diagnosis not present

## 2017-03-09 DIAGNOSIS — G8929 Other chronic pain: Secondary | ICD-10-CM | POA: Diagnosis not present

## 2017-03-09 DIAGNOSIS — M545 Low back pain: Secondary | ICD-10-CM | POA: Diagnosis not present

## 2017-03-09 DIAGNOSIS — F331 Major depressive disorder, recurrent, moderate: Secondary | ICD-10-CM | POA: Diagnosis not present

## 2017-03-30 DIAGNOSIS — R42 Dizziness and giddiness: Secondary | ICD-10-CM | POA: Diagnosis not present

## 2017-03-30 DIAGNOSIS — R0602 Shortness of breath: Secondary | ICD-10-CM | POA: Diagnosis not present

## 2017-03-30 DIAGNOSIS — R002 Palpitations: Secondary | ICD-10-CM | POA: Diagnosis not present

## 2017-04-01 DIAGNOSIS — Z96611 Presence of right artificial shoulder joint: Secondary | ICD-10-CM | POA: Diagnosis not present

## 2017-04-01 DIAGNOSIS — M25561 Pain in right knee: Secondary | ICD-10-CM | POA: Diagnosis not present

## 2017-04-09 DIAGNOSIS — R0602 Shortness of breath: Secondary | ICD-10-CM | POA: Diagnosis not present

## 2017-04-15 DIAGNOSIS — R001 Bradycardia, unspecified: Secondary | ICD-10-CM | POA: Diagnosis not present

## 2017-04-15 DIAGNOSIS — R42 Dizziness and giddiness: Secondary | ICD-10-CM | POA: Diagnosis not present

## 2017-04-15 DIAGNOSIS — R002 Palpitations: Secondary | ICD-10-CM | POA: Diagnosis not present

## 2017-04-15 DIAGNOSIS — R0602 Shortness of breath: Secondary | ICD-10-CM | POA: Diagnosis not present

## 2017-04-15 DIAGNOSIS — F411 Generalized anxiety disorder: Secondary | ICD-10-CM | POA: Diagnosis not present

## 2017-04-19 DIAGNOSIS — F0281 Dementia in other diseases classified elsewhere with behavioral disturbance: Secondary | ICD-10-CM | POA: Diagnosis not present

## 2017-04-19 DIAGNOSIS — F331 Major depressive disorder, recurrent, moderate: Secondary | ICD-10-CM | POA: Diagnosis not present

## 2017-04-19 DIAGNOSIS — G309 Alzheimer's disease, unspecified: Secondary | ICD-10-CM | POA: Diagnosis not present

## 2017-04-19 DIAGNOSIS — R4189 Other symptoms and signs involving cognitive functions and awareness: Secondary | ICD-10-CM | POA: Diagnosis not present

## 2017-04-19 DIAGNOSIS — R2681 Unsteadiness on feet: Secondary | ICD-10-CM | POA: Diagnosis not present

## 2017-04-19 DIAGNOSIS — F0151 Vascular dementia with behavioral disturbance: Secondary | ICD-10-CM | POA: Diagnosis not present

## 2017-04-20 ENCOUNTER — Other Ambulatory Visit (HOSPITAL_COMMUNITY): Payer: Self-pay | Admitting: Neurology

## 2017-04-20 DIAGNOSIS — F0390 Unspecified dementia without behavioral disturbance: Secondary | ICD-10-CM

## 2017-04-20 DIAGNOSIS — F0280 Dementia in other diseases classified elsewhere without behavioral disturbance: Principal | ICD-10-CM

## 2017-04-20 DIAGNOSIS — G988 Other disorders of nervous system: Principal | ICD-10-CM

## 2017-04-21 DIAGNOSIS — F411 Generalized anxiety disorder: Secondary | ICD-10-CM | POA: Diagnosis not present

## 2017-04-21 DIAGNOSIS — R202 Paresthesia of skin: Secondary | ICD-10-CM | POA: Diagnosis not present

## 2017-04-21 DIAGNOSIS — D649 Anemia, unspecified: Secondary | ICD-10-CM | POA: Diagnosis not present

## 2017-04-21 DIAGNOSIS — E538 Deficiency of other specified B group vitamins: Secondary | ICD-10-CM | POA: Diagnosis not present

## 2017-04-21 DIAGNOSIS — F331 Major depressive disorder, recurrent, moderate: Secondary | ICD-10-CM | POA: Diagnosis not present

## 2017-04-22 DIAGNOSIS — M5136 Other intervertebral disc degeneration, lumbar region: Secondary | ICD-10-CM | POA: Diagnosis not present

## 2017-04-26 ENCOUNTER — Ambulatory Visit (HOSPITAL_COMMUNITY)
Admission: RE | Admit: 2017-04-26 | Discharge: 2017-04-26 | Disposition: A | Discharge status: Home / Self Care | Payer: PPO | Source: Ambulatory Visit | Attending: Neurology | Admitting: Neurology

## 2017-04-26 DIAGNOSIS — G988 Other disorders of nervous system: Secondary | ICD-10-CM | POA: Diagnosis not present

## 2017-04-26 DIAGNOSIS — M810 Age-related osteoporosis without current pathological fracture: Secondary | ICD-10-CM | POA: Diagnosis not present

## 2017-04-26 DIAGNOSIS — J329 Chronic sinusitis, unspecified: Secondary | ICD-10-CM | POA: Insufficient documentation

## 2017-04-26 DIAGNOSIS — F0280 Dementia in other diseases classified elsewhere without behavioral disturbance: Secondary | ICD-10-CM | POA: Diagnosis not present

## 2017-04-26 DIAGNOSIS — F0390 Unspecified dementia without behavioral disturbance: Secondary | ICD-10-CM

## 2017-04-26 DIAGNOSIS — R410 Disorientation, unspecified: Secondary | ICD-10-CM | POA: Diagnosis not present

## 2017-04-26 DIAGNOSIS — R296 Repeated falls: Secondary | ICD-10-CM | POA: Diagnosis not present

## 2017-04-28 DIAGNOSIS — R42 Dizziness and giddiness: Secondary | ICD-10-CM | POA: Diagnosis not present

## 2017-04-28 DIAGNOSIS — E538 Deficiency of other specified B group vitamins: Secondary | ICD-10-CM | POA: Diagnosis not present

## 2017-04-30 DIAGNOSIS — J328 Other chronic sinusitis: Secondary | ICD-10-CM | POA: Diagnosis not present

## 2017-04-30 DIAGNOSIS — R42 Dizziness and giddiness: Secondary | ICD-10-CM | POA: Diagnosis not present

## 2017-05-04 DIAGNOSIS — E538 Deficiency of other specified B group vitamins: Secondary | ICD-10-CM | POA: Diagnosis not present

## 2017-05-05 DIAGNOSIS — R0602 Shortness of breath: Secondary | ICD-10-CM | POA: Diagnosis not present

## 2017-05-05 DIAGNOSIS — M5136 Other intervertebral disc degeneration, lumbar region: Secondary | ICD-10-CM | POA: Diagnosis not present

## 2017-05-05 DIAGNOSIS — G4733 Obstructive sleep apnea (adult) (pediatric): Secondary | ICD-10-CM | POA: Diagnosis not present

## 2017-05-06 DIAGNOSIS — G4733 Obstructive sleep apnea (adult) (pediatric): Secondary | ICD-10-CM | POA: Diagnosis not present

## 2017-05-06 DIAGNOSIS — R42 Dizziness and giddiness: Secondary | ICD-10-CM | POA: Diagnosis not present

## 2017-05-06 DIAGNOSIS — H903 Sensorineural hearing loss, bilateral: Secondary | ICD-10-CM | POA: Diagnosis not present

## 2017-05-06 DIAGNOSIS — R0602 Shortness of breath: Secondary | ICD-10-CM | POA: Diagnosis not present

## 2017-05-11 DIAGNOSIS — E538 Deficiency of other specified B group vitamins: Secondary | ICD-10-CM | POA: Diagnosis not present

## 2017-05-12 DIAGNOSIS — E538 Deficiency of other specified B group vitamins: Secondary | ICD-10-CM | POA: Diagnosis not present

## 2017-05-12 DIAGNOSIS — F331 Major depressive disorder, recurrent, moderate: Secondary | ICD-10-CM | POA: Diagnosis not present

## 2017-05-13 DIAGNOSIS — R42 Dizziness and giddiness: Secondary | ICD-10-CM | POA: Diagnosis not present

## 2017-05-18 DIAGNOSIS — E538 Deficiency of other specified B group vitamins: Secondary | ICD-10-CM | POA: Diagnosis not present

## 2017-05-19 DIAGNOSIS — F411 Generalized anxiety disorder: Secondary | ICD-10-CM | POA: Diagnosis not present

## 2017-05-19 DIAGNOSIS — R002 Palpitations: Secondary | ICD-10-CM | POA: Diagnosis not present

## 2017-05-19 DIAGNOSIS — R42 Dizziness and giddiness: Secondary | ICD-10-CM | POA: Diagnosis not present

## 2017-05-19 DIAGNOSIS — R0602 Shortness of breath: Secondary | ICD-10-CM | POA: Diagnosis not present

## 2017-05-26 DIAGNOSIS — M810 Age-related osteoporosis without current pathological fracture: Secondary | ICD-10-CM | POA: Diagnosis not present

## 2017-05-31 DIAGNOSIS — R42 Dizziness and giddiness: Secondary | ICD-10-CM | POA: Diagnosis not present

## 2017-06-14 DIAGNOSIS — R42 Dizziness and giddiness: Secondary | ICD-10-CM | POA: Diagnosis not present

## 2017-06-15 DIAGNOSIS — N182 Chronic kidney disease, stage 2 (mild): Secondary | ICD-10-CM | POA: Diagnosis not present

## 2017-06-15 DIAGNOSIS — D649 Anemia, unspecified: Secondary | ICD-10-CM | POA: Diagnosis not present

## 2017-06-15 DIAGNOSIS — E538 Deficiency of other specified B group vitamins: Secondary | ICD-10-CM | POA: Diagnosis not present

## 2017-06-15 DIAGNOSIS — R7303 Prediabetes: Secondary | ICD-10-CM | POA: Diagnosis not present

## 2017-06-22 DIAGNOSIS — F0280 Dementia in other diseases classified elsewhere without behavioral disturbance: Secondary | ICD-10-CM | POA: Diagnosis not present

## 2017-06-22 DIAGNOSIS — J329 Chronic sinusitis, unspecified: Secondary | ICD-10-CM | POA: Diagnosis not present

## 2017-06-22 DIAGNOSIS — J310 Chronic rhinitis: Secondary | ICD-10-CM | POA: Diagnosis not present

## 2017-06-22 DIAGNOSIS — E039 Hypothyroidism, unspecified: Secondary | ICD-10-CM | POA: Diagnosis not present

## 2017-06-22 DIAGNOSIS — F331 Major depressive disorder, recurrent, moderate: Secondary | ICD-10-CM | POA: Diagnosis not present

## 2017-06-22 DIAGNOSIS — R269 Unspecified abnormalities of gait and mobility: Secondary | ICD-10-CM | POA: Diagnosis not present

## 2017-06-22 DIAGNOSIS — R2681 Unsteadiness on feet: Secondary | ICD-10-CM | POA: Diagnosis not present

## 2017-06-22 DIAGNOSIS — G3109 Other frontotemporal dementia: Secondary | ICD-10-CM | POA: Diagnosis not present

## 2017-06-22 DIAGNOSIS — F411 Generalized anxiety disorder: Secondary | ICD-10-CM | POA: Diagnosis not present

## 2017-07-02 ENCOUNTER — Other Ambulatory Visit
Admission: RE | Admit: 2017-07-02 | Discharge: 2017-07-02 | Disposition: A | Discharge status: Home / Self Care | Payer: PPO | Source: Ambulatory Visit | Attending: Ophthalmology | Admitting: Ophthalmology

## 2017-07-02 DIAGNOSIS — M316 Other giant cell arteritis: Secondary | ICD-10-CM | POA: Diagnosis not present

## 2017-07-02 LAB — CBC WITH DIFFERENTIAL/PLATELET
BASOS PCT: 1 %
Basophils Absolute: 0.1 10*3/uL (ref 0–0.1)
EOS ABS: 0.6 10*3/uL (ref 0–0.7)
EOS PCT: 6 %
HCT: 36.8 % (ref 35.0–47.0)
HEMOGLOBIN: 12.2 g/dL (ref 12.0–16.0)
LYMPHS ABS: 1.8 10*3/uL (ref 1.0–3.6)
Lymphocytes Relative: 19 %
MCH: 29.9 pg (ref 26.0–34.0)
MCHC: 33.3 g/dL (ref 32.0–36.0)
MCV: 89.8 fL (ref 80.0–100.0)
MONO ABS: 1.1 10*3/uL — AB (ref 0.2–0.9)
MONOS PCT: 12 %
NEUTROS PCT: 62 %
Neutro Abs: 6 10*3/uL (ref 1.4–6.5)
Platelets: 289 10*3/uL (ref 150–440)
RBC: 4.1 MIL/uL (ref 3.80–5.20)
RDW: 14.5 % (ref 11.5–14.5)
WBC: 9.5 10*3/uL (ref 3.6–11.0)

## 2017-07-02 LAB — SEDIMENTATION RATE: SED RATE: 6 mm/h (ref 0–30)

## 2017-07-03 LAB — C-REACTIVE PROTEIN: CRP: 1 mg/dL — AB (ref <1.0)

## 2017-08-20 DIAGNOSIS — R4189 Other symptoms and signs involving cognitive functions and awareness: Secondary | ICD-10-CM | POA: Diagnosis not present

## 2017-08-20 DIAGNOSIS — F0151 Vascular dementia with behavioral disturbance: Secondary | ICD-10-CM | POA: Diagnosis not present

## 2017-08-20 DIAGNOSIS — G309 Alzheimer's disease, unspecified: Secondary | ICD-10-CM | POA: Diagnosis not present

## 2017-08-20 DIAGNOSIS — F331 Major depressive disorder, recurrent, moderate: Secondary | ICD-10-CM | POA: Diagnosis not present

## 2017-08-20 DIAGNOSIS — R2681 Unsteadiness on feet: Secondary | ICD-10-CM | POA: Diagnosis not present

## 2017-08-23 DIAGNOSIS — R002 Palpitations: Secondary | ICD-10-CM | POA: Diagnosis not present

## 2017-08-23 DIAGNOSIS — F411 Generalized anxiety disorder: Secondary | ICD-10-CM | POA: Diagnosis not present

## 2017-08-23 DIAGNOSIS — F0151 Vascular dementia with behavioral disturbance: Secondary | ICD-10-CM | POA: Diagnosis not present

## 2017-08-23 DIAGNOSIS — R0602 Shortness of breath: Secondary | ICD-10-CM | POA: Diagnosis not present

## 2017-08-23 DIAGNOSIS — R9431 Abnormal electrocardiogram [ECG] [EKG]: Secondary | ICD-10-CM | POA: Diagnosis not present

## 2017-08-23 DIAGNOSIS — N182 Chronic kidney disease, stage 2 (mild): Secondary | ICD-10-CM | POA: Diagnosis not present

## 2017-08-23 DIAGNOSIS — G309 Alzheimer's disease, unspecified: Secondary | ICD-10-CM | POA: Diagnosis not present

## 2017-08-23 DIAGNOSIS — E039 Hypothyroidism, unspecified: Secondary | ICD-10-CM | POA: Diagnosis not present

## 2017-08-23 DIAGNOSIS — R42 Dizziness and giddiness: Secondary | ICD-10-CM | POA: Diagnosis not present

## 2017-08-31 DIAGNOSIS — E538 Deficiency of other specified B group vitamins: Secondary | ICD-10-CM | POA: Diagnosis not present

## 2017-09-17 DIAGNOSIS — E039 Hypothyroidism, unspecified: Secondary | ICD-10-CM | POA: Diagnosis not present

## 2017-09-22 ENCOUNTER — Ambulatory Visit: Payer: PPO | Admitting: Physical Therapy

## 2017-09-23 DIAGNOSIS — E039 Hypothyroidism, unspecified: Secondary | ICD-10-CM | POA: Diagnosis not present

## 2017-09-23 DIAGNOSIS — E538 Deficiency of other specified B group vitamins: Secondary | ICD-10-CM | POA: Diagnosis not present

## 2017-09-27 ENCOUNTER — Ambulatory Visit: Payer: PPO | Admitting: Physical Therapy

## 2017-09-29 ENCOUNTER — Ambulatory Visit: Payer: PPO | Admitting: Physical Therapy

## 2017-10-01 DIAGNOSIS — E538 Deficiency of other specified B group vitamins: Secondary | ICD-10-CM | POA: Diagnosis not present

## 2017-10-04 ENCOUNTER — Ambulatory Visit: Payer: PPO | Admitting: Physical Therapy

## 2017-10-04 DIAGNOSIS — J328 Other chronic sinusitis: Secondary | ICD-10-CM | POA: Diagnosis not present

## 2017-10-06 ENCOUNTER — Ambulatory Visit: Payer: PPO | Admitting: Physical Therapy

## 2017-10-11 ENCOUNTER — Ambulatory Visit: Payer: PPO | Admitting: Physical Therapy

## 2017-10-13 ENCOUNTER — Ambulatory Visit: Payer: PPO | Admitting: Physical Therapy

## 2017-10-18 ENCOUNTER — Ambulatory Visit: Payer: PPO | Admitting: Physical Therapy

## 2017-10-20 ENCOUNTER — Ambulatory Visit: Payer: PPO | Admitting: Physical Therapy

## 2017-11-02 DIAGNOSIS — E538 Deficiency of other specified B group vitamins: Secondary | ICD-10-CM | POA: Diagnosis not present

## 2017-12-06 DIAGNOSIS — E538 Deficiency of other specified B group vitamins: Secondary | ICD-10-CM | POA: Diagnosis not present

## 2018-01-03 DIAGNOSIS — E039 Hypothyroidism, unspecified: Secondary | ICD-10-CM | POA: Diagnosis not present

## 2018-01-03 DIAGNOSIS — E538 Deficiency of other specified B group vitamins: Secondary | ICD-10-CM | POA: Diagnosis not present

## 2018-01-07 DIAGNOSIS — E538 Deficiency of other specified B group vitamins: Secondary | ICD-10-CM | POA: Diagnosis not present

## 2018-01-07 DIAGNOSIS — D649 Anemia, unspecified: Secondary | ICD-10-CM | POA: Diagnosis not present

## 2018-01-07 DIAGNOSIS — E039 Hypothyroidism, unspecified: Secondary | ICD-10-CM | POA: Diagnosis not present

## 2018-01-07 DIAGNOSIS — R7303 Prediabetes: Secondary | ICD-10-CM | POA: Diagnosis not present

## 2018-01-07 DIAGNOSIS — N183 Chronic kidney disease, stage 3 (moderate): Secondary | ICD-10-CM | POA: Diagnosis not present

## 2018-01-07 DIAGNOSIS — R42 Dizziness and giddiness: Secondary | ICD-10-CM | POA: Diagnosis not present

## 2018-02-09 DIAGNOSIS — E538 Deficiency of other specified B group vitamins: Secondary | ICD-10-CM | POA: Diagnosis not present

## 2018-03-04 DIAGNOSIS — H26492 Other secondary cataract, left eye: Secondary | ICD-10-CM | POA: Diagnosis not present

## 2018-03-14 DIAGNOSIS — E538 Deficiency of other specified B group vitamins: Secondary | ICD-10-CM | POA: Diagnosis not present

## 2018-03-24 DIAGNOSIS — R1310 Dysphagia, unspecified: Secondary | ICD-10-CM | POA: Diagnosis not present

## 2018-03-29 ENCOUNTER — Other Ambulatory Visit: Payer: Self-pay | Admitting: Surgery

## 2018-03-29 ENCOUNTER — Other Ambulatory Visit (HOSPITAL_COMMUNITY): Payer: Self-pay | Admitting: Surgery

## 2018-03-29 DIAGNOSIS — R1310 Dysphagia, unspecified: Secondary | ICD-10-CM

## 2018-04-05 ENCOUNTER — Ambulatory Visit
Admission: RE | Admit: 2018-04-05 | Discharge: 2018-04-05 | Disposition: A | Discharge status: Home / Self Care | Payer: PPO | Source: Ambulatory Visit | Attending: Surgery | Admitting: Surgery

## 2018-04-05 ENCOUNTER — Other Ambulatory Visit: Payer: Self-pay | Admitting: Surgery

## 2018-04-05 DIAGNOSIS — R1310 Dysphagia, unspecified: Secondary | ICD-10-CM

## 2018-04-05 DIAGNOSIS — K449 Diaphragmatic hernia without obstruction or gangrene: Secondary | ICD-10-CM | POA: Diagnosis not present

## 2018-04-07 ENCOUNTER — Telehealth: Payer: Self-pay | Admitting: Surgery

## 2018-04-07 NOTE — Telephone Encounter (Signed)
Call patient to discuss results of upper GI.  As expected, recurrent hiatal hernia demonstrated. This is a third recurrence, and I do not recommend that she consider any surgical intervention for this. Some tertiary lower esophageal contractions and reflux, as expected.  Also noted a possible small Zenker's diverticulum which could be contributing to her symptoms of things getting caught in her throat.  She already has an ENT and suggested she consider follow-up with them although looking at the images it seems like a quite small diverticulum.  Also discussed eating precautions to minimize dysphagia and a sensation of things getting caught.  She states that she has to take a very large calcium pill and we discussed that there are chewable options that she may consider.

## 2018-04-14 DIAGNOSIS — E039 Hypothyroidism, unspecified: Secondary | ICD-10-CM | POA: Diagnosis not present

## 2018-04-14 DIAGNOSIS — E538 Deficiency of other specified B group vitamins: Secondary | ICD-10-CM | POA: Diagnosis not present

## 2018-05-25 DIAGNOSIS — E538 Deficiency of other specified B group vitamins: Secondary | ICD-10-CM | POA: Diagnosis not present

## 2018-06-27 DIAGNOSIS — E538 Deficiency of other specified B group vitamins: Secondary | ICD-10-CM | POA: Diagnosis not present

## 2018-06-27 DIAGNOSIS — E039 Hypothyroidism, unspecified: Secondary | ICD-10-CM | POA: Diagnosis not present

## 2018-07-13 DIAGNOSIS — H26491 Other secondary cataract, right eye: Secondary | ICD-10-CM | POA: Diagnosis not present

## 2018-07-13 DIAGNOSIS — M810 Age-related osteoporosis without current pathological fracture: Secondary | ICD-10-CM | POA: Diagnosis not present

## 2018-07-13 DIAGNOSIS — H43813 Vitreous degeneration, bilateral: Secondary | ICD-10-CM | POA: Diagnosis not present

## 2018-07-28 DIAGNOSIS — N183 Chronic kidney disease, stage 3 (moderate): Secondary | ICD-10-CM | POA: Diagnosis not present

## 2018-07-28 DIAGNOSIS — E039 Hypothyroidism, unspecified: Secondary | ICD-10-CM | POA: Diagnosis not present

## 2018-07-28 DIAGNOSIS — F331 Major depressive disorder, recurrent, moderate: Secondary | ICD-10-CM | POA: Diagnosis not present

## 2018-07-28 DIAGNOSIS — Z9989 Dependence on other enabling machines and devices: Secondary | ICD-10-CM | POA: Diagnosis not present

## 2018-07-28 DIAGNOSIS — F0151 Vascular dementia with behavioral disturbance: Secondary | ICD-10-CM | POA: Diagnosis not present

## 2018-07-28 DIAGNOSIS — R7303 Prediabetes: Secondary | ICD-10-CM | POA: Diagnosis not present

## 2018-07-28 DIAGNOSIS — F3340 Major depressive disorder, recurrent, in remission, unspecified: Secondary | ICD-10-CM | POA: Diagnosis not present

## 2018-07-28 DIAGNOSIS — G309 Alzheimer's disease, unspecified: Secondary | ICD-10-CM | POA: Diagnosis not present

## 2018-07-28 DIAGNOSIS — E538 Deficiency of other specified B group vitamins: Secondary | ICD-10-CM | POA: Diagnosis not present

## 2018-08-12 DIAGNOSIS — M810 Age-related osteoporosis without current pathological fracture: Secondary | ICD-10-CM | POA: Diagnosis not present

## 2018-08-25 DIAGNOSIS — L820 Inflamed seborrheic keratosis: Secondary | ICD-10-CM | POA: Diagnosis not present

## 2018-08-25 DIAGNOSIS — G309 Alzheimer's disease, unspecified: Secondary | ICD-10-CM | POA: Diagnosis not present

## 2018-08-25 DIAGNOSIS — L821 Other seborrheic keratosis: Secondary | ICD-10-CM | POA: Diagnosis not present

## 2018-08-25 DIAGNOSIS — D485 Neoplasm of uncertain behavior of skin: Secondary | ICD-10-CM | POA: Diagnosis not present

## 2018-08-25 DIAGNOSIS — E039 Hypothyroidism, unspecified: Secondary | ICD-10-CM | POA: Diagnosis not present

## 2018-08-25 DIAGNOSIS — R002 Palpitations: Secondary | ICD-10-CM | POA: Diagnosis not present

## 2018-08-25 DIAGNOSIS — N183 Chronic kidney disease, stage 3 (moderate): Secondary | ICD-10-CM | POA: Diagnosis not present

## 2018-08-25 DIAGNOSIS — F0151 Vascular dementia with behavioral disturbance: Secondary | ICD-10-CM | POA: Diagnosis not present

## 2018-08-25 DIAGNOSIS — Z85828 Personal history of other malignant neoplasm of skin: Secondary | ICD-10-CM | POA: Diagnosis not present

## 2018-08-25 DIAGNOSIS — K219 Gastro-esophageal reflux disease without esophagitis: Secondary | ICD-10-CM | POA: Diagnosis not present

## 2018-08-30 DIAGNOSIS — E538 Deficiency of other specified B group vitamins: Secondary | ICD-10-CM | POA: Diagnosis not present

## 2018-09-30 DIAGNOSIS — E538 Deficiency of other specified B group vitamins: Secondary | ICD-10-CM | POA: Diagnosis not present

## 2018-10-11 DIAGNOSIS — L958 Other vasculitis limited to the skin: Secondary | ICD-10-CM | POA: Diagnosis not present

## 2018-10-11 DIAGNOSIS — L720 Epidermal cyst: Secondary | ICD-10-CM | POA: Diagnosis not present

## 2018-10-19 DIAGNOSIS — L820 Inflamed seborrheic keratosis: Secondary | ICD-10-CM | POA: Diagnosis not present

## 2018-10-19 DIAGNOSIS — L821 Other seborrheic keratosis: Secondary | ICD-10-CM | POA: Diagnosis not present

## 2018-10-19 DIAGNOSIS — L578 Other skin changes due to chronic exposure to nonionizing radiation: Secondary | ICD-10-CM | POA: Diagnosis not present

## 2018-10-19 DIAGNOSIS — L958 Other vasculitis limited to the skin: Secondary | ICD-10-CM | POA: Diagnosis not present

## 2018-11-01 DIAGNOSIS — E538 Deficiency of other specified B group vitamins: Secondary | ICD-10-CM | POA: Diagnosis not present

## 2018-11-28 DIAGNOSIS — H6121 Impacted cerumen, right ear: Secondary | ICD-10-CM | POA: Diagnosis not present

## 2018-11-28 DIAGNOSIS — J324 Chronic pansinusitis: Secondary | ICD-10-CM | POA: Diagnosis not present

## 2018-11-28 DIAGNOSIS — R040 Epistaxis: Secondary | ICD-10-CM | POA: Diagnosis not present

## 2018-11-28 DIAGNOSIS — J301 Allergic rhinitis due to pollen: Secondary | ICD-10-CM | POA: Diagnosis not present

## 2018-12-02 DIAGNOSIS — E538 Deficiency of other specified B group vitamins: Secondary | ICD-10-CM | POA: Diagnosis not present

## 2018-12-30 DIAGNOSIS — H02052 Trichiasis without entropian right lower eyelid: Secondary | ICD-10-CM | POA: Diagnosis not present

## 2019-01-03 DIAGNOSIS — E538 Deficiency of other specified B group vitamins: Secondary | ICD-10-CM | POA: Diagnosis not present

## 2019-01-30 DIAGNOSIS — G309 Alzheimer's disease, unspecified: Secondary | ICD-10-CM | POA: Diagnosis not present

## 2019-01-30 DIAGNOSIS — E538 Deficiency of other specified B group vitamins: Secondary | ICD-10-CM | POA: Diagnosis not present

## 2019-01-30 DIAGNOSIS — F331 Major depressive disorder, recurrent, moderate: Secondary | ICD-10-CM | POA: Diagnosis not present

## 2019-01-30 DIAGNOSIS — N1831 Chronic kidney disease, stage 3a: Secondary | ICD-10-CM | POA: Diagnosis not present

## 2019-01-30 DIAGNOSIS — R7303 Prediabetes: Secondary | ICD-10-CM | POA: Diagnosis not present

## 2019-01-30 DIAGNOSIS — Z9989 Dependence on other enabling machines and devices: Secondary | ICD-10-CM | POA: Diagnosis not present

## 2019-01-30 DIAGNOSIS — E039 Hypothyroidism, unspecified: Secondary | ICD-10-CM | POA: Diagnosis not present

## 2019-01-30 DIAGNOSIS — Z0000 Encounter for general adult medical examination without abnormal findings: Secondary | ICD-10-CM | POA: Diagnosis not present

## 2019-01-30 DIAGNOSIS — F3340 Major depressive disorder, recurrent, in remission, unspecified: Secondary | ICD-10-CM | POA: Diagnosis not present

## 2019-01-30 DIAGNOSIS — F0151 Vascular dementia with behavioral disturbance: Secondary | ICD-10-CM | POA: Diagnosis not present

## 2019-02-03 DIAGNOSIS — E538 Deficiency of other specified B group vitamins: Secondary | ICD-10-CM | POA: Diagnosis not present

## 2019-03-01 DIAGNOSIS — H02052 Trichiasis without entropian right lower eyelid: Secondary | ICD-10-CM | POA: Diagnosis not present

## 2019-03-07 DIAGNOSIS — E538 Deficiency of other specified B group vitamins: Secondary | ICD-10-CM | POA: Diagnosis not present

## 2019-03-07 DIAGNOSIS — D649 Anemia, unspecified: Secondary | ICD-10-CM | POA: Diagnosis not present

## 2019-03-14 DIAGNOSIS — J019 Acute sinusitis, unspecified: Secondary | ICD-10-CM | POA: Diagnosis not present

## 2019-03-14 DIAGNOSIS — Z20822 Contact with and (suspected) exposure to covid-19: Secondary | ICD-10-CM | POA: Diagnosis not present

## 2019-03-20 DIAGNOSIS — G9601 Cranial cerebrospinal fluid leak, spontaneous: Secondary | ICD-10-CM | POA: Diagnosis not present

## 2019-03-20 DIAGNOSIS — F0151 Vascular dementia with behavioral disturbance: Secondary | ICD-10-CM | POA: Diagnosis not present

## 2019-03-20 DIAGNOSIS — G309 Alzheimer's disease, unspecified: Secondary | ICD-10-CM | POA: Diagnosis not present

## 2019-03-20 DIAGNOSIS — R2681 Unsteadiness on feet: Secondary | ICD-10-CM | POA: Diagnosis not present

## 2019-04-10 DIAGNOSIS — Z20822 Contact with and (suspected) exposure to covid-19: Secondary | ICD-10-CM | POA: Diagnosis not present

## 2019-04-10 DIAGNOSIS — E538 Deficiency of other specified B group vitamins: Secondary | ICD-10-CM | POA: Diagnosis not present

## 2019-04-10 DIAGNOSIS — J069 Acute upper respiratory infection, unspecified: Secondary | ICD-10-CM | POA: Diagnosis not present

## 2019-04-17 DIAGNOSIS — R05 Cough: Secondary | ICD-10-CM | POA: Diagnosis not present

## 2019-04-17 DIAGNOSIS — J300 Vasomotor rhinitis: Secondary | ICD-10-CM | POA: Diagnosis not present

## 2019-04-20 DIAGNOSIS — G9601 Cranial cerebrospinal fluid leak, spontaneous: Secondary | ICD-10-CM | POA: Diagnosis not present

## 2019-05-01 DIAGNOSIS — H02052 Trichiasis without entropian right lower eyelid: Secondary | ICD-10-CM | POA: Diagnosis not present

## 2019-05-11 DIAGNOSIS — E538 Deficiency of other specified B group vitamins: Secondary | ICD-10-CM | POA: Diagnosis not present

## 2019-06-13 DIAGNOSIS — E538 Deficiency of other specified B group vitamins: Secondary | ICD-10-CM | POA: Diagnosis not present

## 2019-06-30 DIAGNOSIS — H02052 Trichiasis without entropian right lower eyelid: Secondary | ICD-10-CM | POA: Diagnosis not present

## 2019-07-14 DIAGNOSIS — E538 Deficiency of other specified B group vitamins: Secondary | ICD-10-CM | POA: Diagnosis not present

## 2019-07-20 DIAGNOSIS — H02052 Trichiasis without entropian right lower eyelid: Secondary | ICD-10-CM | POA: Diagnosis not present

## 2019-07-31 DIAGNOSIS — G309 Alzheimer's disease, unspecified: Secondary | ICD-10-CM | POA: Diagnosis not present

## 2019-07-31 DIAGNOSIS — N1831 Chronic kidney disease, stage 3a: Secondary | ICD-10-CM | POA: Diagnosis not present

## 2019-07-31 DIAGNOSIS — E039 Hypothyroidism, unspecified: Secondary | ICD-10-CM | POA: Diagnosis not present

## 2019-07-31 DIAGNOSIS — F3340 Major depressive disorder, recurrent, in remission, unspecified: Secondary | ICD-10-CM | POA: Diagnosis not present

## 2019-07-31 DIAGNOSIS — F0151 Vascular dementia with behavioral disturbance: Secondary | ICD-10-CM | POA: Diagnosis not present

## 2019-07-31 DIAGNOSIS — R7303 Prediabetes: Secondary | ICD-10-CM | POA: Diagnosis not present

## 2019-07-31 DIAGNOSIS — Z9989 Dependence on other enabling machines and devices: Secondary | ICD-10-CM | POA: Diagnosis not present

## 2019-07-31 IMAGING — RF DG UGI W/ HIGH DENSITY W/O KUB
4 series · 14 of 24 positions shown · non-contrast
Comparison: None.

CLINICAL DATA: Dysphagia worsening for 1 month

EXAM:
UPPER GI SERIES WITHOUT KUB
TECHNIQUE: Routine upper GI series was performed with thin/high density/water
soluble barium.
FLUOROSCOPY TIME:  Fluoroscopy Time:  54 seconds
Radiation Exposure Index (if provided by the fluoroscopic device):
5.2 mGy
Number of Acquired Spot Images: 0

[Series 1: cp_standard · 0.25mm/px · 2 of 26 frames shown (1 of 4)]
[frame 4/26]
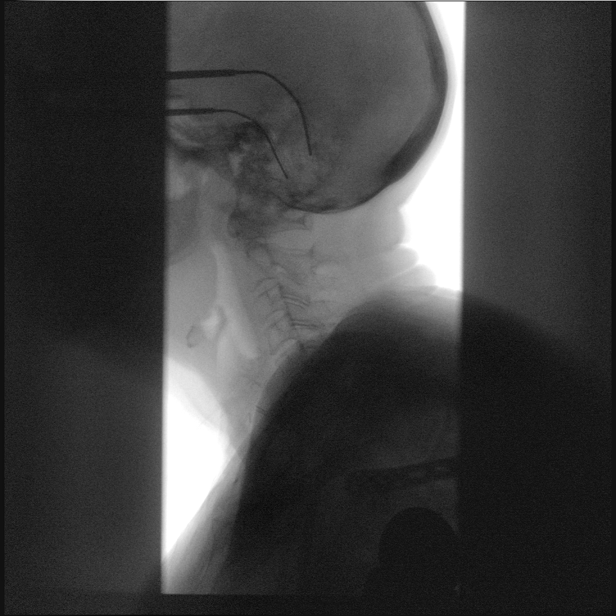
[frame 23/26]
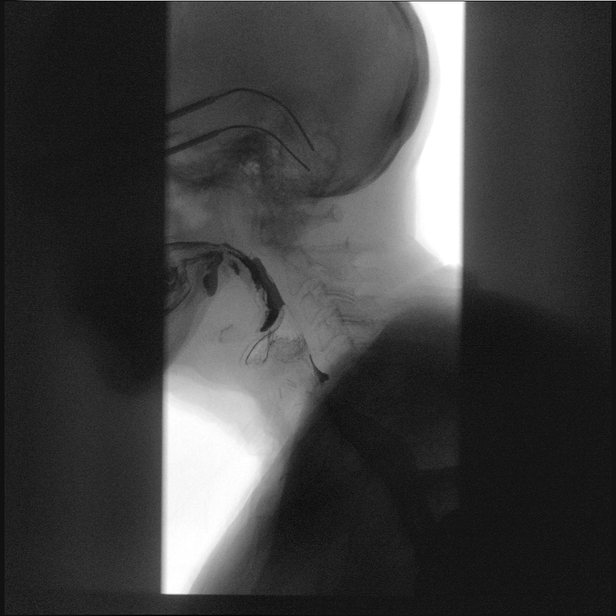

[Series 2: cp_standard · 0.25mm/px · 3 of 38 frames shown (2 of 4)]
[frame 6/38]
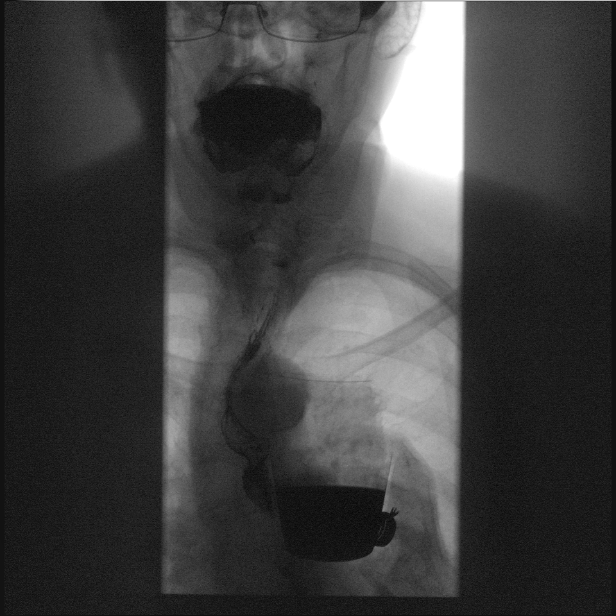
[frame 27/38]
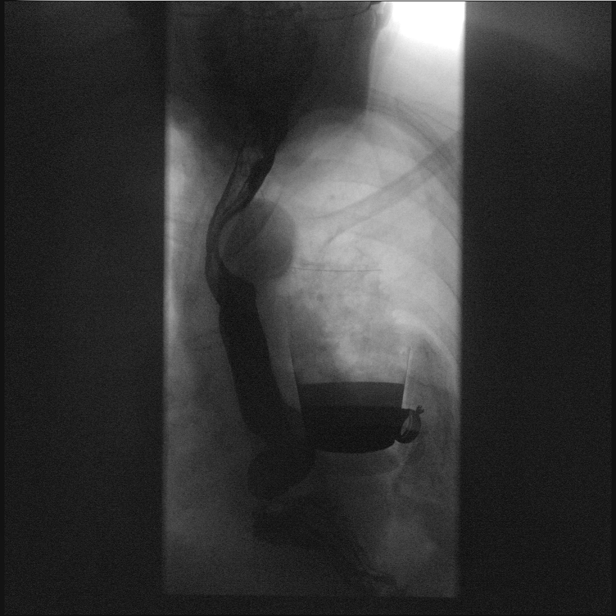
[frame 33/38]
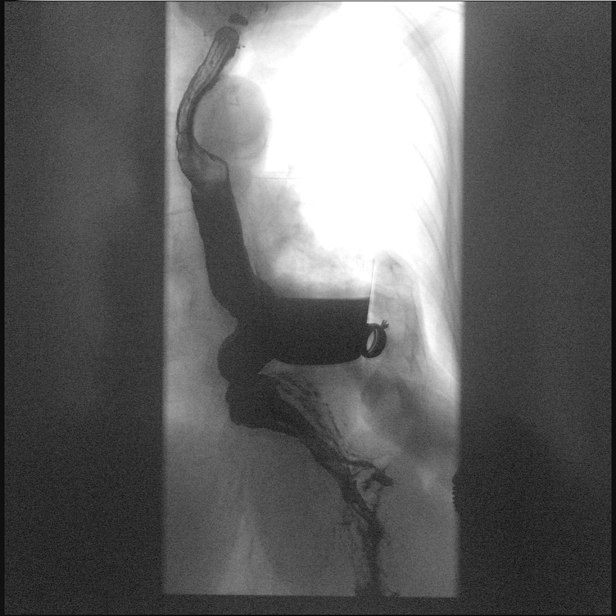

[Series 3: cp_standard · 0.25mm/px · 2 of 32 frames shown (3 of 4)]
[frame 17/32]
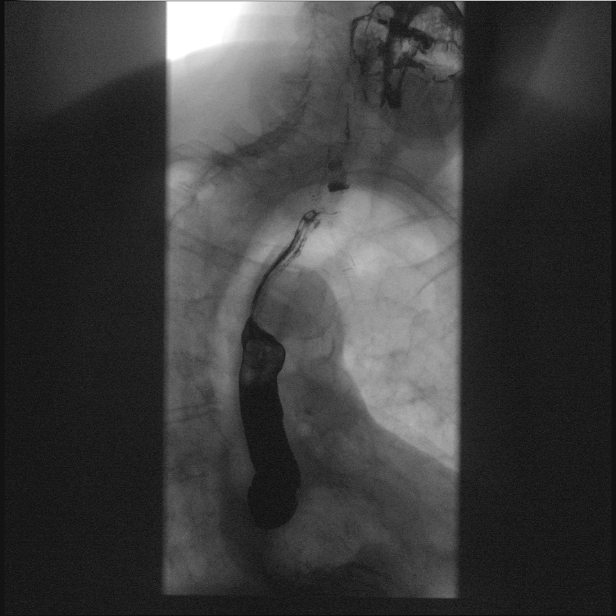
[frame 32/32  full-range]
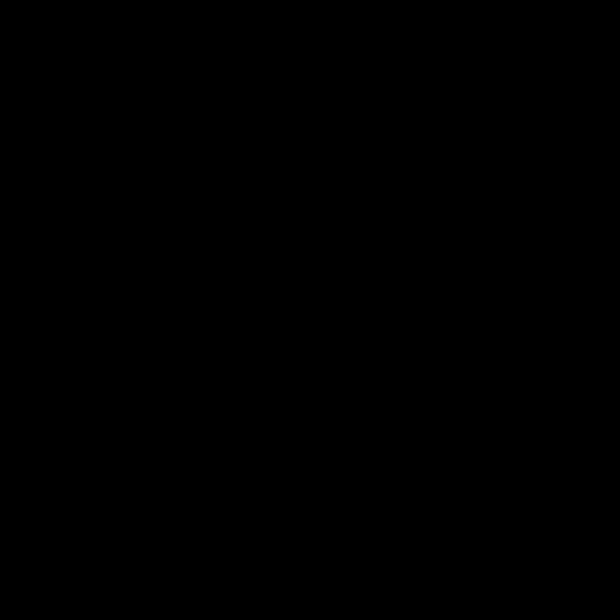

[Series 4: cp_standard · 0.25mm/px · 7 of 12 slices shown (4 of 4)]
[im 1/12]
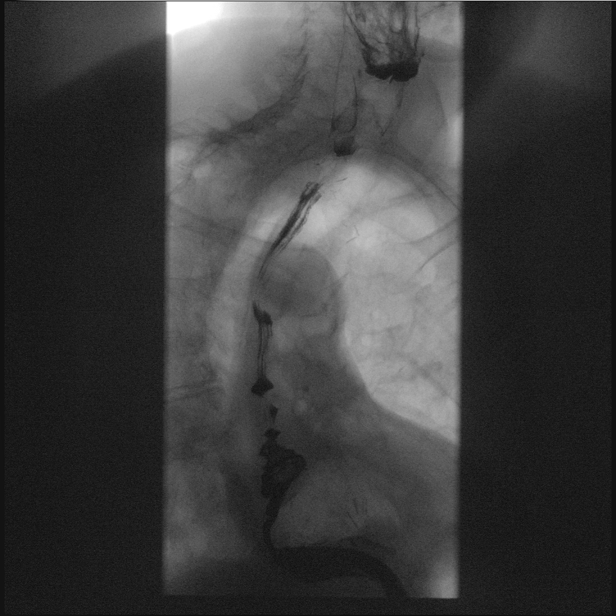
[im 3/12]
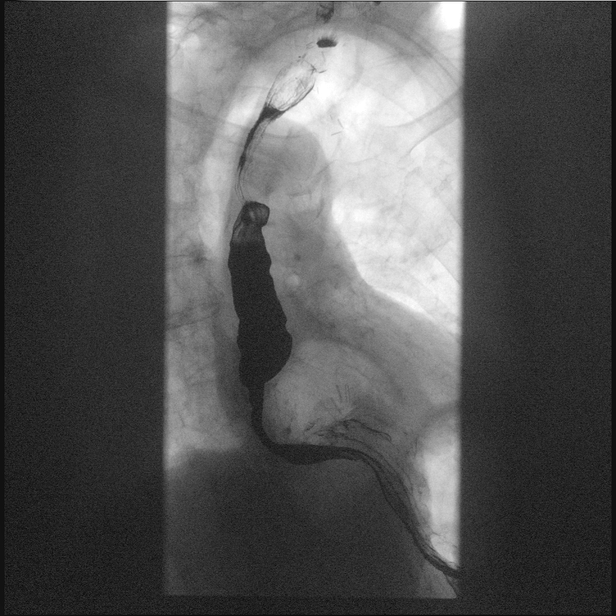
[im 5/12]
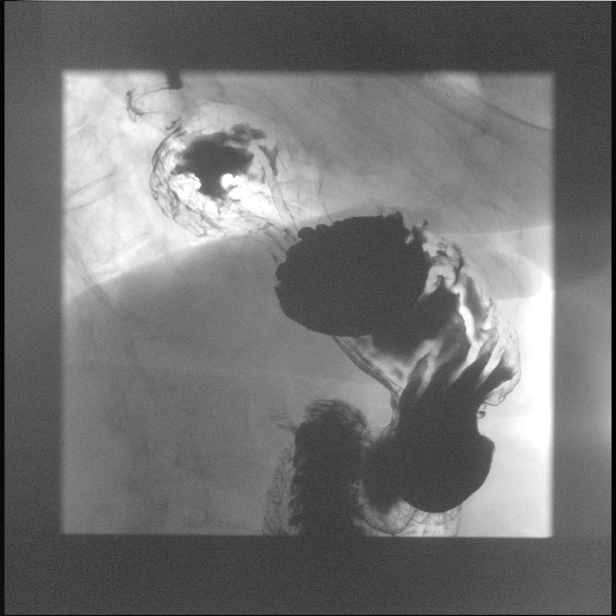
[im 7/12]
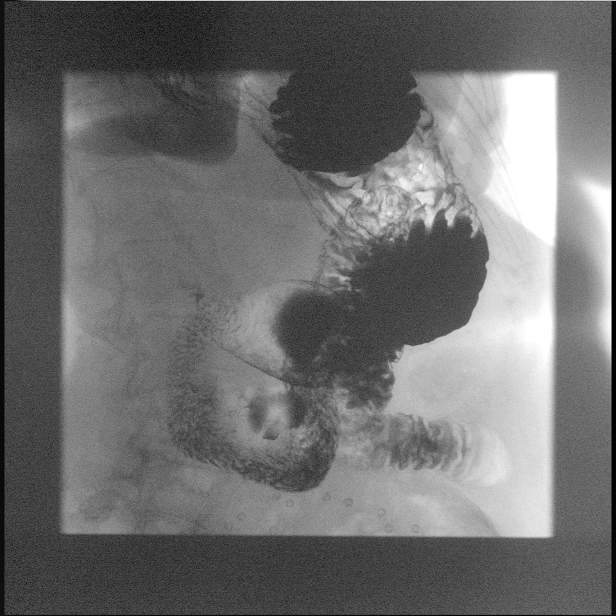
[im 8/12]
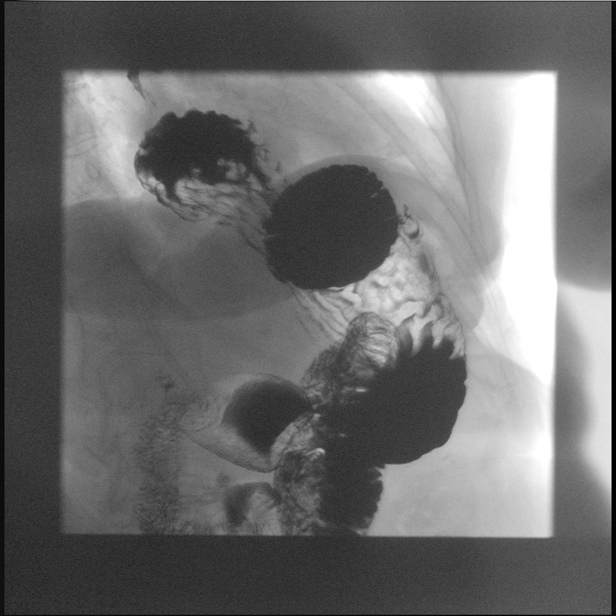
[im 10/12]
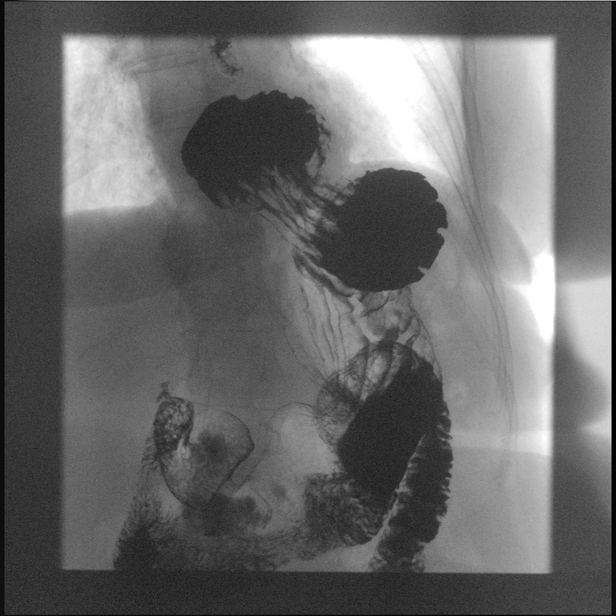
[im 12/12]
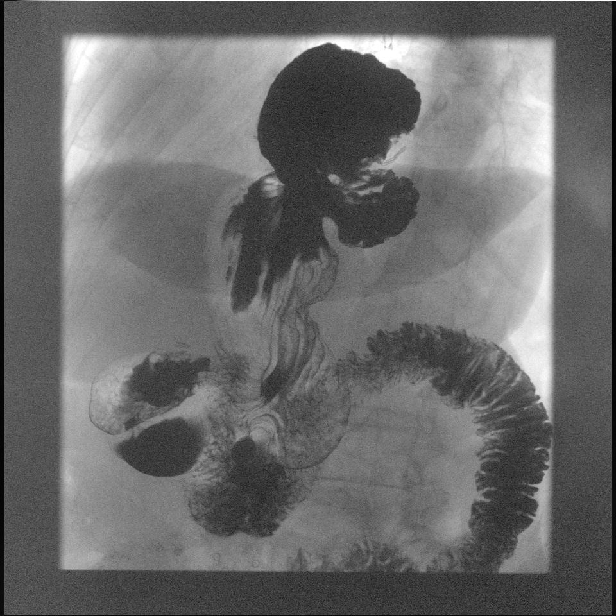

[14 of 24 positions shown; findings below may reference images not displayed]

FINDINGS: Examination of the esophagus demonstrated normal esophageal
motility. Normal esophageal morphology without evidence of
esophagitis or ulceration. No esophageal stricture or mass lesion.
Possible Jommel Bornilla diverticulum. Large hiatal hernia. Mild
gastroesophageal reflux. Tertiary contractions of the distal third
of the esophagus as can be seen with mild spasm.

Examination of the stomach demonstrated normal rugal folds and areae
gastricae. The gastric mucosa appeared unremarkable without evidence
of ulceration, scarring, or mass lesion. Gastric motility and
emptying was normal. Fluoroscopic examination of the duodenum
demonstrates normal motility and morphology without evidence of
ulceration or mass lesion.
IMPRESSION: 1.  Large hiatal hernia.
2. Mild gastroesophageal reflux.
3. Tertiary contractions of the distal third of the esophagus as can
be seen with mild spasm.

## 2019-08-14 DIAGNOSIS — E538 Deficiency of other specified B group vitamins: Secondary | ICD-10-CM | POA: Diagnosis not present

## 2019-09-12 DIAGNOSIS — R0602 Shortness of breath: Secondary | ICD-10-CM | POA: Diagnosis not present

## 2019-09-12 DIAGNOSIS — K219 Gastro-esophageal reflux disease without esophagitis: Secondary | ICD-10-CM | POA: Diagnosis not present

## 2019-09-12 DIAGNOSIS — G309 Alzheimer's disease, unspecified: Secondary | ICD-10-CM | POA: Diagnosis not present

## 2019-09-12 DIAGNOSIS — F411 Generalized anxiety disorder: Secondary | ICD-10-CM | POA: Diagnosis not present

## 2019-09-12 DIAGNOSIS — R002 Palpitations: Secondary | ICD-10-CM | POA: Diagnosis not present

## 2019-09-12 DIAGNOSIS — F0151 Vascular dementia with behavioral disturbance: Secondary | ICD-10-CM | POA: Diagnosis not present

## 2019-09-12 DIAGNOSIS — N1831 Chronic kidney disease, stage 3a: Secondary | ICD-10-CM | POA: Diagnosis not present

## 2019-09-15 DIAGNOSIS — E538 Deficiency of other specified B group vitamins: Secondary | ICD-10-CM | POA: Diagnosis not present

## 2019-09-26 DIAGNOSIS — J3489 Other specified disorders of nose and nasal sinuses: Secondary | ICD-10-CM | POA: Diagnosis not present

## 2019-09-28 ENCOUNTER — Other Ambulatory Visit: Payer: Self-pay | Admitting: Neurosurgery

## 2019-09-28 DIAGNOSIS — J3489 Other specified disorders of nose and nasal sinuses: Secondary | ICD-10-CM

## 2019-10-02 DIAGNOSIS — J3489 Other specified disorders of nose and nasal sinuses: Secondary | ICD-10-CM | POA: Diagnosis not present

## 2019-10-13 ENCOUNTER — Ambulatory Visit
Admission: RE | Admit: 2019-10-13 | Discharge: 2019-10-13 | Disposition: A | Discharge status: Home / Self Care | Payer: PPO | Source: Ambulatory Visit | Attending: Neurosurgery | Admitting: Neurosurgery

## 2019-10-13 ENCOUNTER — Other Ambulatory Visit: Payer: Self-pay

## 2019-10-13 DIAGNOSIS — J3489 Other specified disorders of nose and nasal sinuses: Secondary | ICD-10-CM | POA: Diagnosis not present

## 2019-10-13 DIAGNOSIS — J324 Chronic pansinusitis: Secondary | ICD-10-CM | POA: Diagnosis not present

## 2019-10-13 DIAGNOSIS — J0130 Acute sphenoidal sinusitis, unspecified: Secondary | ICD-10-CM | POA: Diagnosis not present

## 2019-10-13 DIAGNOSIS — J320 Chronic maxillary sinusitis: Secondary | ICD-10-CM | POA: Diagnosis not present

## 2019-10-13 LAB — POCT I-STAT CREATININE: Creatinine, Ser: 1.3 mg/dL — ABNORMAL HIGH (ref 0.44–1.00)

## 2019-10-13 MED ORDER — IOHEXOL 300 MG/ML  SOLN
75.0000 mL | Freq: Once | INTRAMUSCULAR | Status: AC | PRN
  Administered 2019-10-13: 60 mL via INTRAVENOUS

## 2019-10-16 DIAGNOSIS — E538 Deficiency of other specified B group vitamins: Secondary | ICD-10-CM | POA: Diagnosis not present

## 2019-10-19 DIAGNOSIS — J310 Chronic rhinitis: Secondary | ICD-10-CM | POA: Diagnosis not present

## 2019-10-19 DIAGNOSIS — R0602 Shortness of breath: Secondary | ICD-10-CM | POA: Diagnosis not present

## 2019-10-19 DIAGNOSIS — J324 Chronic pansinusitis: Secondary | ICD-10-CM | POA: Diagnosis not present

## 2019-10-19 DIAGNOSIS — H6121 Impacted cerumen, right ear: Secondary | ICD-10-CM | POA: Diagnosis not present

## 2019-10-23 DIAGNOSIS — R6883 Chills (without fever): Secondary | ICD-10-CM | POA: Diagnosis not present

## 2019-10-23 DIAGNOSIS — R197 Diarrhea, unspecified: Secondary | ICD-10-CM | POA: Diagnosis not present

## 2019-10-27 DIAGNOSIS — Z03818 Encounter for observation for suspected exposure to other biological agents ruled out: Secondary | ICD-10-CM | POA: Diagnosis not present

## 2019-10-27 DIAGNOSIS — R5383 Other fatigue: Secondary | ICD-10-CM | POA: Diagnosis not present

## 2019-10-27 DIAGNOSIS — R6883 Chills (without fever): Secondary | ICD-10-CM | POA: Diagnosis not present

## 2019-11-13 ENCOUNTER — Other Ambulatory Visit: Payer: Self-pay

## 2019-11-13 ENCOUNTER — Ambulatory Visit: Payer: PPO | Admitting: Dermatology

## 2019-11-13 ENCOUNTER — Encounter: Payer: Self-pay | Admitting: Dermatology

## 2019-11-13 DIAGNOSIS — L821 Other seborrheic keratosis: Secondary | ICD-10-CM

## 2019-11-13 DIAGNOSIS — I8390 Asymptomatic varicose veins of unspecified lower extremity: Secondary | ICD-10-CM

## 2019-11-13 DIAGNOSIS — L578 Other skin changes due to chronic exposure to nonionizing radiation: Secondary | ICD-10-CM

## 2019-11-13 DIAGNOSIS — L570 Actinic keratosis: Secondary | ICD-10-CM | POA: Diagnosis not present

## 2019-11-13 DIAGNOSIS — L820 Inflamed seborrheic keratosis: Secondary | ICD-10-CM | POA: Diagnosis not present

## 2019-11-13 NOTE — Patient Instructions (Addendum)

## 2019-11-13 NOTE — Progress Notes (Signed)
   Follow-Up Visit   Subjective  Brenda Goodman is a 86 y.o. female who presents for the following: Follow-up Patient presents today for a few areas of concern to be evaluated today. 1st is on her Left forearm, 2nd on left 3rd knuckle and 3rd is on her Right shoulder  The following portions of the chart were reviewed this encounter and updated as appropriate:  Tobacco  Allergies  Meds  Problems  Med Hx  Surg Hx  Fam Hx     Review of Systems:  No other skin or systemic complaints except as noted in HPI or Assessment and Plan.  Objective  Well appearing patient in no apparent distress; mood and affect are within normal limits.  A focused examination was performed including Right Shoulder, Left hand, and Left forearm. Relevant physical exam findings are noted in the Assessment and Plan.  Objective  Above Left Eyebrow, Left Nose, Right Nose: Erythematous thin papules/macules with gritty scale.    Assessment & Plan  Inflamed seborrheic keratosis (17) Left Arm and hand x 16; Right Forearm - Anterior  Destruction of lesion - Left Arm and hand x 16 Complexity: simple   Destruction method: cryotherapy   Informed consent: discussed and consent obtained   Timeout:  patient name, date of birth, surgical site, and procedure verified Lesion destroyed using liquid nitrogen: Yes   Region frozen until ice ball extended beyond lesion: Yes   Outcome: patient tolerated procedure well with no complications   Post-procedure details: wound care instructions given    Superficial varicosities, unspecified laterality Left Forearm - Anterior Benign, observe.    AK (actinic keratosis) (3) Above Left Eyebrow; Left Nose; Right Nose  Destruction of lesion - Right Nose Complexity: simple   Destruction method: cryotherapy   Informed consent: discussed and consent obtained   Timeout:  patient name, date of birth, surgical site, and procedure verified Lesion destroyed using liquid nitrogen:  Yes   Region frozen until ice ball extended beyond lesion: Yes   Outcome: patient tolerated procedure well with no complications   Post-procedure details: wound care instructions given    Actinic Damage - diffuse scaly erythematous macules with underlying dyspigmentation - Recommend daily broad spectrum sunscreen SPF 30+ to sun-exposed areas, reapply every 2 hours as needed.  - Call for new or changing lesions.  Seborrheic Keratoses - Stuck-on, waxy, tan-brown papules and plaques  - Discussed benign etiology and prognosis. - Observe - Call for any changes  Return in about 4 months (around 03/14/2020) for ISK, AK Follow up.  Marene Lenz, CMA, am acting as scribe for Sarina Ser, MD .  Documentation: I have reviewed the above documentation for accuracy and completeness, and I agree with the above.  Sarina Ser, MD

## 2019-11-15 ENCOUNTER — Encounter: Payer: Self-pay | Admitting: Dermatology

## 2019-11-16 DIAGNOSIS — E538 Deficiency of other specified B group vitamins: Secondary | ICD-10-CM | POA: Diagnosis not present

## 2019-12-18 DIAGNOSIS — E538 Deficiency of other specified B group vitamins: Secondary | ICD-10-CM | POA: Diagnosis not present

## 2020-01-10 DIAGNOSIS — Z961 Presence of intraocular lens: Secondary | ICD-10-CM | POA: Diagnosis not present

## 2020-01-10 DIAGNOSIS — H02052 Trichiasis without entropian right lower eyelid: Secondary | ICD-10-CM | POA: Diagnosis not present

## 2020-02-01 DIAGNOSIS — G309 Alzheimer's disease, unspecified: Secondary | ICD-10-CM | POA: Diagnosis not present

## 2020-02-01 DIAGNOSIS — F0151 Vascular dementia with behavioral disturbance: Secondary | ICD-10-CM | POA: Diagnosis not present

## 2020-02-01 DIAGNOSIS — F3340 Major depressive disorder, recurrent, in remission, unspecified: Secondary | ICD-10-CM | POA: Diagnosis not present

## 2020-02-01 DIAGNOSIS — Z0000 Encounter for general adult medical examination without abnormal findings: Secondary | ICD-10-CM | POA: Diagnosis not present

## 2020-02-01 DIAGNOSIS — R7303 Prediabetes: Secondary | ICD-10-CM | POA: Diagnosis not present

## 2020-02-01 DIAGNOSIS — E039 Hypothyroidism, unspecified: Secondary | ICD-10-CM | POA: Diagnosis not present

## 2020-02-01 DIAGNOSIS — N1831 Chronic kidney disease, stage 3a: Secondary | ICD-10-CM | POA: Diagnosis not present

## 2020-02-01 DIAGNOSIS — Z9989 Dependence on other enabling machines and devices: Secondary | ICD-10-CM | POA: Diagnosis not present

## 2020-02-29 ENCOUNTER — Other Ambulatory Visit: Payer: Self-pay

## 2020-02-29 ENCOUNTER — Ambulatory Visit: Payer: PPO | Admitting: Dermatology

## 2020-02-29 DIAGNOSIS — L578 Other skin changes due to chronic exposure to nonionizing radiation: Secondary | ICD-10-CM | POA: Diagnosis not present

## 2020-02-29 DIAGNOSIS — L821 Other seborrheic keratosis: Secondary | ICD-10-CM | POA: Diagnosis not present

## 2020-02-29 DIAGNOSIS — L820 Inflamed seborrheic keratosis: Secondary | ICD-10-CM

## 2020-02-29 DIAGNOSIS — L570 Actinic keratosis: Secondary | ICD-10-CM

## 2020-02-29 NOTE — Patient Instructions (Signed)

## 2020-02-29 NOTE — Progress Notes (Signed)
   Follow-Up Visit   Subjective  Brenda Goodman is a 87 y.o. female who presents for the following: Follow-up (AK follow up left nose, right nose, above left brow - treated with LN2) and Other (Spots on left forearm that are painful sometimes).  Accompanied by husband  The following portions of the chart were reviewed this encounter and updated as appropriate:   Tobacco  Allergies  Meds  Problems  Med Hx  Surg Hx  Fam Hx     Review of Systems:  No other skin or systemic complaints except as noted in HPI or Assessment and Plan.  Objective  Well appearing patient in no apparent distress; mood and affect are within normal limits.  A focused examination was performed including face, arms. Relevant physical exam findings are noted in the Assessment and Plan.  Objective  Right nose (2): Erythematous thin papules/macules with gritty scale.   Objective  Left forearm (3): Erythematous keratotic or waxy stuck-on papule or plaque.    Assessment & Plan    Actinic Damage - chronic, secondary to cumulative UV radiation exposure/sun exposure over time - diffuse scaly erythematous macules with underlying dyspigmentation - Recommend daily broad spectrum sunscreen SPF 30+ to sun-exposed areas, reapply every 2 hours as needed.  - Call for new or changing lesions.  Seborrheic Keratoses - Stuck-on, waxy, tan-brown papules and plaques  - Discussed benign etiology and prognosis. - Observe - Call for any changes  AK (actinic keratosis) (2) Right nose  Destruction of lesion - Right nose Complexity: simple   Destruction method: cryotherapy   Informed consent: discussed and consent obtained   Timeout:  patient name, date of birth, surgical site, and procedure verified Lesion destroyed using liquid nitrogen: Yes   Region frozen until ice ball extended beyond lesion: Yes   Outcome: patient tolerated procedure well with no complications   Post-procedure details: wound care instructions  given    Inflamed seborrheic keratosis (3) Left forearm  Destruction of lesion - Left forearm Complexity: simple   Destruction method: cryotherapy   Informed consent: discussed and consent obtained   Timeout:  patient name, date of birth, surgical site, and procedure verified Lesion destroyed using liquid nitrogen: Yes   Region frozen until ice ball extended beyond lesion: Yes   Outcome: patient tolerated procedure well with no complications   Post-procedure details: wound care instructions given    Return in about 6 months (around 08/28/2020).  I, Joanie Coddington, CMA, am acting as scribe for Armida Sans, MD .  Documentation: I have reviewed the above documentation for accuracy and completeness, and I agree with the above.  Armida Sans, MD

## 2020-03-07 DIAGNOSIS — J069 Acute upper respiratory infection, unspecified: Secondary | ICD-10-CM | POA: Diagnosis not present

## 2020-03-07 DIAGNOSIS — Z20822 Contact with and (suspected) exposure to covid-19: Secondary | ICD-10-CM | POA: Diagnosis not present

## 2020-03-12 ENCOUNTER — Encounter: Payer: Self-pay | Admitting: Dermatology

## 2020-04-04 DIAGNOSIS — R194 Change in bowel habit: Secondary | ICD-10-CM | POA: Diagnosis not present

## 2020-04-04 DIAGNOSIS — K5730 Diverticulosis of large intestine without perforation or abscess without bleeding: Secondary | ICD-10-CM | POA: Diagnosis not present

## 2020-04-04 DIAGNOSIS — K219 Gastro-esophageal reflux disease without esophagitis: Secondary | ICD-10-CM | POA: Diagnosis not present

## 2020-04-04 DIAGNOSIS — D509 Iron deficiency anemia, unspecified: Secondary | ICD-10-CM | POA: Diagnosis not present

## 2020-04-04 DIAGNOSIS — R159 Full incontinence of feces: Secondary | ICD-10-CM | POA: Diagnosis not present

## 2020-04-19 ENCOUNTER — Other Ambulatory Visit: Payer: Self-pay

## 2020-04-19 ENCOUNTER — Other Ambulatory Visit: Payer: Self-pay | Admitting: Gastroenterology

## 2020-04-19 ENCOUNTER — Encounter (HOSPITAL_COMMUNITY): Payer: Self-pay | Admitting: Gastroenterology

## 2020-04-23 NOTE — Progress Notes (Signed)
Spoke with Linus Orn at Dr. [facility]'s office to request copy of recent positive COVID test.

## 2020-04-26 ENCOUNTER — Other Ambulatory Visit: Payer: Self-pay

## 2020-04-26 ENCOUNTER — Ambulatory Visit (HOSPITAL_COMMUNITY)
Admission: RE | Admit: 2020-04-26 | Discharge: 2020-04-26 | Disposition: A | Discharge status: Home / Self Care | Payer: PPO | Attending: Gastroenterology | Admitting: Gastroenterology

## 2020-04-26 ENCOUNTER — Encounter (HOSPITAL_COMMUNITY)
Admission: RE | Disposition: A | Discharge status: Home / Self Care | Payer: Self-pay | Source: Home / Self Care | Attending: Gastroenterology

## 2020-04-26 ENCOUNTER — Ambulatory Visit (HOSPITAL_COMMUNITY): Payer: PPO | Admitting: Certified Registered Nurse Anesthetist

## 2020-04-26 ENCOUNTER — Encounter (HOSPITAL_COMMUNITY): Payer: Self-pay | Admitting: Gastroenterology

## 2020-04-26 DIAGNOSIS — R197 Diarrhea, unspecified: Secondary | ICD-10-CM | POA: Insufficient documentation

## 2020-04-26 DIAGNOSIS — Z888 Allergy status to other drugs, medicaments and biological substances status: Secondary | ICD-10-CM | POA: Insufficient documentation

## 2020-04-26 DIAGNOSIS — Z5309 Procedure and treatment not carried out because of other contraindication: Secondary | ICD-10-CM | POA: Insufficient documentation

## 2020-04-26 DIAGNOSIS — Z8616 Personal history of COVID-19: Secondary | ICD-10-CM | POA: Insufficient documentation

## 2020-04-26 HISTORY — DX: Unspecified dementia, unspecified severity, without behavioral disturbance, psychotic disturbance, mood disturbance, and anxiety: F03.90

## 2020-04-26 SURGERY — CANCELLED PROCEDURE
Anesthesia: Monitor Anesthesia Care

## 2020-04-26 MED ORDER — PROPOFOL 1000 MG/100ML IV EMUL
INTRAVENOUS | Status: AC
  Filled 2020-04-26: qty 100

## 2020-04-26 MED ORDER — SODIUM CHLORIDE 0.9 % IV SOLN: INTRAVENOUS | Status: DC

## 2020-04-26 MED ORDER — PROPOFOL 500 MG/50ML IV EMUL
INTRAVENOUS | Status: AC
  Filled 2020-04-26: qty 50

## 2020-04-26 MED ORDER — LACTATED RINGERS IV SOLN: INTRAVENOUS | Status: DC

## 2020-04-26 SURGICAL SUPPLY — 22 items

## 2020-04-26 NOTE — Progress Notes (Signed)
Patient was prepped by RN per protocol for colonoscopy procedure.  Patient stated she did not think the prep worked as needed.  RN alerted Dr. Benson Norway who evaluated patient for procedure prep.  Dr. Benson Norway determined prep was insufficient and ordered procedure cancelled.

## 2020-04-26 NOTE — Anesthesia Preprocedure Evaluation (Addendum)
Anesthesia Evaluation  Patient identified by MRN, date of birth, ID band Patient awake    Reviewed: Allergy & Precautions, NPO status , Patient's Chart, lab work & pertinent test results  History of Anesthesia Complications (+) PONV and history of anesthetic complications  Airway Mallampati: III  TM Distance: >3 FB Neck ROM: Full    Dental  (+) Dental Advisory Given   Pulmonary neg pulmonary ROS,    Pulmonary exam normal        Cardiovascular negative cardio ROS Normal cardiovascular exam     Neuro/Psych PSYCHIATRIC DISORDERS Depression Dementia negative neurological ROS     GI/Hepatic Neg liver ROS, hiatal hernia, GERD  Controlled,  Endo/Other  Hypothyroidism   Renal/GU negative Renal ROS     Musculoskeletal  (+) Arthritis ,   Abdominal   Peds  Hematology negative hematology ROS (+)   Anesthesia Other Findings Recent +Covid test per notes, asymptomatic    Reproductive/Obstetrics                            Anesthesia Physical Anesthesia Plan  ASA: III  Anesthesia Plan: MAC   Post-op Pain Management:    Induction: Intravenous  PONV Risk Score and Plan: 3 and Propofol infusion and Treatment may vary due to age or medical condition  Airway Management Planned: Natural Airway and Simple Face Mask  Additional Equipment: None  Intra-op Plan:   Post-operative Plan:   Informed Consent: I have reviewed the patients History and Physical, chart, labs and discussed the procedure including the risks, benefits and alternatives for the proposed anesthesia with the patient or authorized representative who has indicated his/her understanding and acceptance.       Plan Discussed with: CRNA and Anesthesiologist  Anesthesia Plan Comments:        Anesthesia Quick Evaluation

## 2020-04-26 NOTE — H&P (Signed)
Brenda Goodman   HPI: This 87 year old white female presents to the office for further evaluation of problems with fecal incontinence and seepage of stool intermittently for the last 1 month. She has 1-3 BM's per day with occasional diarrhea. She denies the use any artificial sweeteners. She takes Imodium as needed that helps the diarrhea for 2-3 days. There is no obvious blood or mucus in the stool. She has periumbilical pain usually in the mornings. The pain does not radiate and the severity does not radiate. She takes Pepto-Bismol as needed. She takes Omeprazole 20 mg for acid reflux. She had Nissen's fundoplication surgery in 2595. She takes iron supplement daily for iron deficiency anemia. She has good appetite. She has lost 10 pounds since her last visit here in 2008. She denies having any complaints of nausea, vomiting, dysphagia or odynophagia. She denies having a family history of colon cancer, celiac sprue or IBD. Her last colonoscopy done on 01/04/2002 revealed non bleeding internal hemorrhoids, diverticular disease and a large amount of stool was noted in the colon.    Past Medical History:  Diagnosis Date  . Actinic keratosis 09/11/2015   right nasal bridge  . Anemia    followed by pcp and unconcerned.  . Arthritis   . Cancer ([place])    skin  . Cough    CHRONIC  . Dementia ([place])   . Depression   . Diverticulosis   . Dizziness    possibly cardiac related  . GERD (gastroesophageal reflux disease)   . H/O hiatal hernia   . Hx of basal cell carcinoma 07/20/2012   L posterior neck  . Hx of basal cell carcinoma 12/07/2016   R proximal medial calf  . Hypothyroidism   . PONV (postoperative nausea and vomiting)   . Shortness of breath    COUGH , magnesium helps this  . Squamous cell carcinoma of skin 08/16/2008   R lower leg, excised 10/10/2008  . Wheezing     Past Surgical History:  Procedure Laterality Date  . CARDIOVASCULAR STRESS TEST     2012   [place]     . CATARACT EXTRACTION W/PHACO Right 02/26/2015   Procedure: CATARACT EXTRACTION PHACO AND INTRAOCULAR LENS PLACEMENT (I[facility]);  Surgeon: Birder Robson, MD;  Location: [facility];  Service: Ophthalmology;  Laterality: Right;  [place]  02:00 AP% 27.4 CDE 32.96 fluid pack lot # [id number]  . CATARACT EXTRACTION W/PHACO Left 03/19/2015   Procedure: CATARACT EXTRACTION PHACO AND INTRAOCULAR LENS PLACEMENT ([place]);  Surgeon: Birder Robson, MD;  Location: [facility];  Service: Ophthalmology;  Laterality: Left;  [place] 02:07 AP% 26.0 CDE 33.14 fluid pack lot # [id number]  . CATARACT EXTRACTION, BILATERAL    . CHOLECYSTECTOMY    . EYE SURGERY    . hardware in right shoulder Right 2010   ball and pin in shoulder  . HERNIA REPAIR     2012  . JOINT REPLACEMENT    . LUMBAR LAMINECTOMY/DECOMPRESSION MICRODISCECTOMY  05/04/2011   Procedure: LUMBAR LAMINECTOMY/DECOMPRESSION MICRODISCECTOMY 1 LEVEL;  Surgeon: Ophelia Charter, MD;  Location: [facility];  Service: Neurosurgery;  Laterality: N/A;  Lumbar three and lumbar four Laminectomy  . NO PAST SURGERIES     RIGHT ARM SURGERY BALL + PIN   . REVERSE SHOULDER ARTHROPLASTY Right 09/04/2016   Procedure: RIGHT REVERSE TOTAL SHOULDER ARTHROPLASTY REVISION;  Surgeon: Netta Cedars, MD;  Location: [facility];  Service: Orthopedics;  Laterality: Right;  . THYROID LOBECTOMY  2016  APPROX 4 YRS AGO   . THYROIDECTOMY    . TONSILLECTOMY    . TOTAL SHOULDER ARTHROPLASTY     Right    No family history on file.  Social History:  reports that she has never smoked. She has never used smokeless tobacco. She reports that she does not drink alcohol and does not use drugs.  Allergies:  Allergies  Allergen Reactions  . Duloxetine Other (See Comments)    confusion    Medications:  Scheduled:  Continuous: . sodium chloride    . lactated ringers      No results found for this or any previous visit (from the past 24 hour(s)).   No results found.  ROS:  As  stated above in the HPI otherwise negative.  Height 5\' 2"  (1.575 m), weight 63.5 kg.    PE: Gen: NAD, Alert and Oriented HEENT:  [place]/AT, EOMI Neck: Supple, no LAD Lungs: CTA Bilaterally CV: RRR without M/G/R ABD: Soft, NTND, +BS Ext: No C/C/E  Assessment/Plan: 1) Diarrhea. 2) Change in bowel habits.  Plan: 1) Colonoscopy with cold biopsies.  Brenda Goodman D 04/26/2020, 11:29 AM

## 2020-04-30 DIAGNOSIS — E538 Deficiency of other specified B group vitamins: Secondary | ICD-10-CM | POA: Diagnosis not present

## 2020-05-01 DIAGNOSIS — R197 Diarrhea, unspecified: Secondary | ICD-10-CM | POA: Diagnosis not present

## 2020-05-06 DIAGNOSIS — M25561 Pain in right knee: Secondary | ICD-10-CM | POA: Diagnosis not present

## 2020-05-06 DIAGNOSIS — M1711 Unilateral primary osteoarthritis, right knee: Secondary | ICD-10-CM | POA: Diagnosis not present

## 2020-05-06 DIAGNOSIS — M25461 Effusion, right knee: Secondary | ICD-10-CM | POA: Diagnosis not present

## 2020-07-10 DIAGNOSIS — J324 Chronic pansinusitis: Secondary | ICD-10-CM | POA: Diagnosis not present

## 2020-07-10 DIAGNOSIS — R42 Dizziness and giddiness: Secondary | ICD-10-CM | POA: Diagnosis not present

## 2020-07-10 DIAGNOSIS — J331 Polypoid sinus degeneration: Secondary | ICD-10-CM | POA: Diagnosis not present

## 2020-07-19 DIAGNOSIS — F411 Generalized anxiety disorder: Secondary | ICD-10-CM | POA: Diagnosis not present

## 2020-07-19 DIAGNOSIS — R2689 Other abnormalities of gait and mobility: Secondary | ICD-10-CM | POA: Diagnosis not present

## 2020-07-19 DIAGNOSIS — G309 Alzheimer's disease, unspecified: Secondary | ICD-10-CM | POA: Diagnosis not present

## 2020-07-19 DIAGNOSIS — F0151 Vascular dementia with behavioral disturbance: Secondary | ICD-10-CM | POA: Diagnosis not present

## 2020-07-30 DIAGNOSIS — N1831 Chronic kidney disease, stage 3a: Secondary | ICD-10-CM | POA: Diagnosis not present

## 2020-07-30 DIAGNOSIS — G309 Alzheimer's disease, unspecified: Secondary | ICD-10-CM | POA: Diagnosis not present

## 2020-07-30 DIAGNOSIS — R7303 Prediabetes: Secondary | ICD-10-CM | POA: Diagnosis not present

## 2020-07-30 DIAGNOSIS — F3340 Major depressive disorder, recurrent, in remission, unspecified: Secondary | ICD-10-CM | POA: Diagnosis not present

## 2020-07-30 DIAGNOSIS — E039 Hypothyroidism, unspecified: Secondary | ICD-10-CM | POA: Diagnosis not present

## 2020-07-30 DIAGNOSIS — F0151 Vascular dementia with behavioral disturbance: Secondary | ICD-10-CM | POA: Diagnosis not present

## 2020-08-01 ENCOUNTER — Other Ambulatory Visit: Payer: Self-pay

## 2020-08-01 ENCOUNTER — Ambulatory Visit: Payer: PPO | Admitting: Dermatology

## 2020-08-01 DIAGNOSIS — L570 Actinic keratosis: Secondary | ICD-10-CM

## 2020-08-01 DIAGNOSIS — I8290 Acute embolism and thrombosis of unspecified vein: Secondary | ICD-10-CM | POA: Diagnosis not present

## 2020-08-01 DIAGNOSIS — L821 Other seborrheic keratosis: Secondary | ICD-10-CM

## 2020-08-01 DIAGNOSIS — D692 Other nonthrombocytopenic purpura: Secondary | ICD-10-CM | POA: Diagnosis not present

## 2020-08-01 DIAGNOSIS — L578 Other skin changes due to chronic exposure to nonionizing radiation: Secondary | ICD-10-CM

## 2020-08-01 NOTE — Progress Notes (Signed)
Follow-Up Visit   Subjective  Brenda Goodman is a 87 y.o. female who presents for the following: Follow-up (F/u to recheck AKs and ISKs treated with Ln2 at last visit. Pt also has a spot on her left arm that she would like checked. She states that it has changed in size and is sometimes painful when touched or bumped. ).  The following portions of the chart were reviewed this encounter and updated as appropriate:  Tobacco  Allergies  Meds  Problems  Med Hx  Surg Hx  Fam Hx      Review of Systems: No other skin or systemic complaints except as noted in HPI or Assessment and Plan.  Objective  Well appearing patient in no apparent distress; mood and affect are within normal limits.  All sun exposed areas plus back examined.  Nose x 5 (5) Erythematous thin papules/macules with gritty scale.   left forearm Resolving thrombus  Assessment & Plan  AK (actinic keratosis) (5) Nose x 5  Actinic keratoses are precancerous spots that appear secondary to cumulative UV radiation exposure/sun exposure over time. They are chronic with expected duration over 1 year. A portion of actinic keratoses will progress to squamous cell carcinoma of the skin. It is not possible to reliably predict which spots will progress to skin cancer and so treatment is recommended to prevent development of skin cancer.  Recommend daily broad spectrum sunscreen SPF 30+ to sun-exposed areas, reapply every 2 hours as needed.  Recommend staying in the shade or wearing long sleeves, sun glasses (UVA+UVB protection) and wide brim hats (4-inch brim around the entire circumference of the hat). Call for new or changing lesions.   Prior to procedure, discussed risks of blister formation, small wound, skin dyspigmentation, or rare scar following cryotherapy.    Destruction of lesion - Nose x 5 Complexity: simple   Destruction method: cryotherapy   Informed consent: discussed and consent obtained   Timeout:  patient  name, date of birth, surgical site, and procedure verified Lesion destroyed using liquid nitrogen: Yes   Region frozen until ice ball extended beyond lesion: Yes   Outcome: patient tolerated procedure well with no complications   Post-procedure details: wound care instructions given    Thrombus left forearm Advised pt that this will continue to resolve on its own.  We can excise if does not improve or recurs  Actinic Damage - chronic, secondary to cumulative UV radiation exposure/sun exposure over time - diffuse scaly erythematous macules with underlying dyspigmentation - Recommend daily broad spectrum sunscreen SPF 30+ to sun-exposed areas, reapply every 2 hours as needed.  - Recommend staying in the shade or wearing long sleeves, sun glasses (UVA+UVB protection) and wide brim hats (4-inch brim around the entire circumference of the hat). - Call for new or changing lesions.  Purpura - Chronic; persistent and recurrent.  Treatable, but not curable. - Violaceous macules and patches - Benign - Related to trauma, age, sun damage and/or use of blood thinners, chronic use of topical and/or oral steroids - Observe - Can use OTC arnica containing moisturizer such as Dermend Bruise Formula if desired - Call for worsening or other concerns  Seborrheic Keratoses Cheeks, chest, arms - Stuck-on, waxy, tan-brown papules and/or plaques  - Benign-appearing - Discussed benign etiology and prognosis. - Observe - Call for any changes  Return in about 9 months (around 05/01/2021) for recheck AKs.  IHarriett Sine, CMA, am acting as scribe for Sarina Ser, MD.  Documentation: I  have reviewed the above documentation for accuracy and completeness, and I agree with the above.  Sarina Ser, MD

## 2020-08-01 NOTE — Patient Instructions (Signed)
Cryotherapy Aftercare  . Wash gently with soap and water everyday.   . Apply Vaseline and Band-Aid daily until healed.  

## 2020-08-06 ENCOUNTER — Encounter: Payer: Self-pay | Admitting: Dermatology

## 2020-08-08 DIAGNOSIS — E538 Deficiency of other specified B group vitamins: Secondary | ICD-10-CM | POA: Diagnosis not present

## 2020-08-23 DIAGNOSIS — M25561 Pain in right knee: Secondary | ICD-10-CM | POA: Diagnosis not present

## 2020-08-23 DIAGNOSIS — M1711 Unilateral primary osteoarthritis, right knee: Secondary | ICD-10-CM | POA: Diagnosis not present

## 2020-08-23 DIAGNOSIS — J321 Chronic frontal sinusitis: Secondary | ICD-10-CM | POA: Diagnosis not present

## 2020-08-23 DIAGNOSIS — G8929 Other chronic pain: Secondary | ICD-10-CM | POA: Diagnosis not present

## 2020-08-23 DIAGNOSIS — R42 Dizziness and giddiness: Secondary | ICD-10-CM | POA: Diagnosis not present

## 2020-08-23 DIAGNOSIS — J324 Chronic pansinusitis: Secondary | ICD-10-CM | POA: Diagnosis not present

## 2020-09-05 ENCOUNTER — Ambulatory Visit: Payer: PPO | Admitting: Dermatology

## 2020-09-10 DIAGNOSIS — R002 Palpitations: Secondary | ICD-10-CM | POA: Diagnosis not present

## 2020-09-10 DIAGNOSIS — F411 Generalized anxiety disorder: Secondary | ICD-10-CM | POA: Diagnosis not present

## 2020-09-10 DIAGNOSIS — E538 Deficiency of other specified B group vitamins: Secondary | ICD-10-CM | POA: Diagnosis not present

## 2020-09-10 DIAGNOSIS — N1831 Chronic kidney disease, stage 3a: Secondary | ICD-10-CM | POA: Diagnosis not present

## 2020-09-11 DIAGNOSIS — M1711 Unilateral primary osteoarthritis, right knee: Secondary | ICD-10-CM | POA: Diagnosis not present

## 2020-09-18 DIAGNOSIS — M1711 Unilateral primary osteoarthritis, right knee: Secondary | ICD-10-CM | POA: Diagnosis not present

## 2020-10-04 DIAGNOSIS — M1711 Unilateral primary osteoarthritis, right knee: Secondary | ICD-10-CM | POA: Diagnosis not present

## 2020-10-11 DIAGNOSIS — E538 Deficiency of other specified B group vitamins: Secondary | ICD-10-CM | POA: Diagnosis not present

## 2020-10-11 DIAGNOSIS — M1711 Unilateral primary osteoarthritis, right knee: Secondary | ICD-10-CM | POA: Diagnosis not present

## 2020-10-11 DIAGNOSIS — M25461 Effusion, right knee: Secondary | ICD-10-CM | POA: Diagnosis not present

## 2020-11-08 DIAGNOSIS — H43813 Vitreous degeneration, bilateral: Secondary | ICD-10-CM | POA: Diagnosis not present

## 2020-11-08 DIAGNOSIS — H02052 Trichiasis without entropian right lower eyelid: Secondary | ICD-10-CM | POA: Diagnosis not present

## 2020-11-11 DIAGNOSIS — E538 Deficiency of other specified B group vitamins: Secondary | ICD-10-CM | POA: Diagnosis not present

## 2020-12-12 DIAGNOSIS — Z23 Encounter for immunization: Secondary | ICD-10-CM | POA: Diagnosis not present

## 2020-12-12 DIAGNOSIS — E538 Deficiency of other specified B group vitamins: Secondary | ICD-10-CM | POA: Diagnosis not present

## 2020-12-25 DIAGNOSIS — F02818 Dementia in other diseases classified elsewhere, unspecified severity, with other behavioral disturbance: Secondary | ICD-10-CM | POA: Diagnosis not present

## 2020-12-25 DIAGNOSIS — K529 Noninfective gastroenteritis and colitis, unspecified: Secondary | ICD-10-CM | POA: Diagnosis not present

## 2020-12-25 DIAGNOSIS — F01518 Vascular dementia, unspecified severity, with other behavioral disturbance: Secondary | ICD-10-CM | POA: Diagnosis not present

## 2020-12-25 DIAGNOSIS — F3340 Major depressive disorder, recurrent, in remission, unspecified: Secondary | ICD-10-CM | POA: Diagnosis not present

## 2020-12-25 DIAGNOSIS — R7303 Prediabetes: Secondary | ICD-10-CM | POA: Diagnosis not present

## 2020-12-25 DIAGNOSIS — E039 Hypothyroidism, unspecified: Secondary | ICD-10-CM | POA: Diagnosis not present

## 2020-12-25 DIAGNOSIS — N1831 Chronic kidney disease, stage 3a: Secondary | ICD-10-CM | POA: Diagnosis not present

## 2020-12-25 DIAGNOSIS — G309 Alzheimer's disease, unspecified: Secondary | ICD-10-CM | POA: Diagnosis not present

## 2021-01-13 DIAGNOSIS — E538 Deficiency of other specified B group vitamins: Secondary | ICD-10-CM | POA: Diagnosis not present

## 2021-03-06 ENCOUNTER — Telehealth: Payer: Self-pay | Admitting: Student

## 2021-03-06 NOTE — Telephone Encounter (Signed)
Spoke with husband Jori Moll regarding the Palliative referral/services and he was in agreement with this.  I have scheduled an In-home Consult for  03/20/21 @ 10 AM.

## 2021-03-20 ENCOUNTER — Other Ambulatory Visit: Payer: PPO | Admitting: Student

## 2021-03-20 ENCOUNTER — Other Ambulatory Visit: Payer: Self-pay

## 2021-03-20 DIAGNOSIS — R451 Restlessness and agitation: Secondary | ICD-10-CM

## 2021-03-20 DIAGNOSIS — F418 Other specified anxiety disorders: Secondary | ICD-10-CM

## 2021-03-20 DIAGNOSIS — Z515 Encounter for palliative care: Secondary | ICD-10-CM

## 2021-03-20 DIAGNOSIS — F02818 Dementia in other diseases classified elsewhere, unspecified severity, with other behavioral disturbance: Secondary | ICD-10-CM

## 2021-03-20 NOTE — Progress Notes (Signed)
[facility] Consult Note Telephone: [phone]  Fax: [phone]   Date of encounter: 03/20/21 10:11 AM PATIENT NAME: NATALIN BIBLE [place] [id number]   [phone] (home)  DOB: 1932/08/04 MRN: [id number] PRIMARY CARE PROVIDER:    Kirk Ruths, MD,  [address] [place] [address] [phone]  REFERRING PROVIDER:   Kirk Ruths, MD [place] [facility] [place],  [place] [address] [phone]  RESPONSIBLE PARTY:    Contact Information     Name Relation Home Work [place] Spouse [phone]  [phone]   Bonnee Quin Daughter [phone]  [phone]        I met face to face with patient and family in the home.  Palliative Care was asked to follow this patient by consultation request of  Kirk Ruths, MD to address advance care planning and complex medical decision making. This is the initial visit.                                     ASSESSMENT AND PLAN / RECOMMENDATIONS:   Advance Care Planning/Goals of Care: Goals include to maximize quality of life and symptom management. Patient/health care surrogate gave his/her permission to discuss.Our advance care planning conversation included a discussion about:    The value and importance of advance care planning  Experiences with loved ones who have been seriously ill or have died  Exploration of personal, cultural or spiritual beliefs that might influence medical decisions  Exploration of goals of care in the event of a sudden injury or illness  Identification  of a healthcare agent; daughter Burr Medico will be DNR completed today; uploaded to [facility] Decision not to resuscitate or to de-escalate disease focused treatments due to poor prognosis. CODE STATUS: DNR   Goal is for patient to remain in the home.  Family would like to increase level of care in the  home; private caregivers.  Palliative medicine will continue to provide supportive care, comfort and symptom management.     I spent 20 minutes providing this consultation. More than 50% of the time in this consultation was spent in counseling and care coordination.  ---------------------------------------------------------------------------------------   Symptom Management/Plan:  Mixed Alzheimer's and vascular dementia-reorient and redirect as needed. Monitor for falls and safety. Patient with agitation. Start olanzapine 2.5 mg each evening at 6 PM. Referral made to Palliative SW for recommendations on in home caregivers.  Depression with anxiety-continue Lexapro 20 mg daily, alprazolam 0.5mg  QD PRN.    Follow up Palliative Care Visit: Palliative care will continue to follow for complex medical decision making, advance care planning, and clarification of goals. Return in 4 weeks or prn.   This visit was coded based on medical decision making (MDM).  PPS: 50%  HOSPICE ELIGIBILITY/DIAGNOSIS: TBD  Chief Complaint: Palliative Medicine consult visit.   HISTORY OF PRESENT ILLNESS:  FLORELLA MCNEESE is a 88 y.o. year old female  with mixed Alzheimer's and vascular dementia, osteoporosis, hypothyroidism, anxiety, CKD 3, anemia, vitamin B12 deficiency, spinal stenosis, chronic nasal polyps and pansinusitis.  Diagnosed with dementia about 4.5 years ago.  Patient is needing additional care/support in the home.   Resides at home with husband; requires some assistance with adl's. Able to dress self, needs assist with bathing. Uses cane for ambulation. No falls. Nancy Fetter downs each  evening. Has increased anxiety and agitation. Has trialed donepezil and namenda in the past. Appetite has been good no weight loss reported.   History obtained from review of EMR, discussion with primary team, and interview with family, facility staff/caregiver and/or Ms. Collier.  I reviewed available labs, medications,  imaging, studies and related documents from the EMR.  Records reviewed and summarized above.   ROS  General: NAD EYES: denies vision changes ENMT: denies dysphagia Cardiovascular: denies chest pain, denies DOE Pulmonary: denies cough, denies increased SOB Abdomen: endorses good appetite, denies constipation, endorses continence of bowel GU: denies dysuria MSK:  denies increased weakness,  no falls reported Skin: denies rashes or wounds Neurological: denies pain, denies insomnia Psych: Endorses positive mood Heme/lymph/immuno: denies bruises, abnormal bleeding  Physical Exam: Pulse 64, resp 16, b/p 118/62, sats 99% on room air Constitutional: NAD General: frail appearing, WNWD  EYES: anicteric sclera, lids intact, no discharge  ENMT: intact hearing, oral mucous membranes moist, dentition intact CV: S1S2, RRR, no LE edema Pulmonary: LCTA, no increased work of breathing, no cough, room air Abdomen: normo-active BS + 4 quadrants, soft and non tender, no ascites GU: deferred MSK: no sarcopenia, moves all extremities, ambulatory with cane Skin: warm and dry, no rashes or wounds on visible skin Neuro:  no generalized weakness, A & O x 2, forgetful Psych: non-anxious affect, pleasant Hem/lymph/immuno: no widespread bruising CURRENT PROBLEM LIST:  Patient Active Problem List   Diagnosis Date Noted   S/P shoulder replacement, right 09/04/2016   PAST MEDICAL HISTORY:  Active Ambulatory Problems    Diagnosis Date Noted   S/P shoulder replacement, right 09/04/2016   Resolved Ambulatory Problems    Diagnosis Date Noted   No Resolved Ambulatory Problems   Past Medical History:  Diagnosis Date   Actinic keratosis 09/11/2015   Anemia    Arthritis    Cancer ([facility])    Cough    Dementia ([facility])    Depression    Diverticulosis    Dizziness    GERD (gastroesophageal reflux disease)    H/O hiatal hernia    Hx of basal cell carcinoma 07/20/2012   Hx of basal cell carcinoma 12/07/2016    Hypothyroidism    PONV (postoperative nausea and vomiting)    Shortness of breath    Squamous cell carcinoma of skin 08/16/2008   Wheezing    SOCIAL HX:  Social History   Tobacco Use   Smoking status: Never   Smokeless tobacco: Never   Tobacco comments:    no passive smoke in home  Substance Use Topics   Alcohol use: No    Comment: occasional   FAMILY HX: No family history on file.    ALLERGIES:  Allergies  Allergen Reactions   Duloxetine Other (See Comments)    confusion     PERTINENT MEDICATIONS:  Outpatient Encounter Medications as of 03/20/2021  Medication Sig   acetaminophen (TYLENOL) 500 MG tablet Take 1,000 mg by mouth every 6 (six) hours as needed for moderate pain.   albuterol (VENTOLIN HFA) 108 (90 Base) MCG/ACT inhaler Inhale 2 puffs into the lungs every 6 (six) hours as needed for wheezing or shortness of breath.   ALPRAZolam (XANAX) 0.5 MG tablet Take 0.5 mg by mouth daily as needed for anxiety.   ARTIFICIAL TEAR SOLUTION OP Place 1 drop into both eyes daily as needed (dry eyes).   bismuth subsalicylate (PEPTO BISMOL) 262 MG chewable tablet Chew 262 mg by mouth as needed for diarrhea or loose stools  or indigestion.   Brimonidine Tartrate (LUMIFY) 0.025 % SOLN Place 1 drop into both eyes daily as needed (redness).   Calcium Carb-Cholecalciferol (CALCIUM 600 + D PO) Take 1 tablet by mouth every evening.   Cholecalciferol (VITAMIN D) 50 MCG (2000 UT) tablet Take 2,000 Units by mouth daily.   colestipol (COLESTID) 1 G tablet Take 2 g by mouth 2 (two) times daily.   cyanocobalamin (,VITAMIN B-12,) 1000 MCG/ML injection Inject 1,000 mcg into the muscle every 30 (thirty) days.   diphenhydrAMINE (BENADRYL) 25 MG tablet Take 25 mg by mouth daily as needed for allergies.   escitalopram (LEXAPRO) 20 MG tablet Take 20 mg by mouth daily.   ferrous sulfate 325 (65 FE) MG tablet Take 325 mg by mouth daily with breakfast.   ipratropium (ATROVENT) 0.06 % nasal spray Place 2  sprays into both nostrils daily.   levothyroxine (SYNTHROID, LEVOTHROID) 125 MCG tablet Take 125 mcg by mouth daily before breakfast.   loperamide (IMODIUM A-D) 2 MG tablet Take 2 mg by mouth 4 (four) times daily as needed for diarrhea or loose stools.   Naphazoline-Pheniramine (OPCON-A) 0.027-0.315 % SOLN Place 1 drop into both eyes daily as needed (redness).   omeprazole (PRILOSEC) 20 MG capsule Take 20 mg by mouth daily.   No facility-administered encounter medications on file as of 03/20/2021.   Thank you for the opportunity to participate in the care of Ms. Badami.  The palliative care team will continue to follow. Please call our office at [phone] if we can be of additional assistance.   Ezekiel Slocumb, NP   COVID-19 PATIENT SCREENING TOOL Asked and negative response unless otherwise noted:  Have you had symptoms of covid, tested positive or been in contact with someone with symptoms/positive test in the past 5-10 days? No

## 2021-04-22 ENCOUNTER — Other Ambulatory Visit: Payer: PPO | Admitting: Student

## 2021-04-22 ENCOUNTER — Other Ambulatory Visit: Payer: Self-pay

## 2021-04-22 VITALS — BP 134/78 | HR 52 | Resp 16

## 2021-04-22 DIAGNOSIS — Z515 Encounter for palliative care: Secondary | ICD-10-CM

## 2021-04-22 DIAGNOSIS — R52 Pain, unspecified: Secondary | ICD-10-CM

## 2021-04-22 DIAGNOSIS — F419 Anxiety disorder, unspecified: Secondary | ICD-10-CM

## 2021-04-22 DIAGNOSIS — F02818 Alzheimer's disease, unspecified: Secondary | ICD-10-CM

## 2021-04-22 DIAGNOSIS — F03911 Unspecified dementia, unspecified severity, with agitation: Secondary | ICD-10-CM

## 2021-04-22 DIAGNOSIS — F01518 Vascular dementia, unspecified severity, with other behavioral disturbance: Secondary | ICD-10-CM

## 2021-04-22 NOTE — Progress Notes (Signed)
[facility] Consult Note Telephone: [phone]  Fax: [phone]    Date of encounter: 04/22/21 11:07 AM PATIENT NAME: Brenda Goodman [place] [address]   [phone] (home)  DOB: Feb 01, 1933 MRN: [id number] PRIMARY CARE PROVIDER:    Kirk Ruths, MD,  [address] [place] [address] [phone]  REFERRING PROVIDER:   Kirk Ruths, MD [facility] [place],  [place] [address] [phone]  RESPONSIBLE PARTY:    Contact Information     Name Relation Home Work Brenda Goodman Spouse [phone]  [phone]   Brenda Goodman Daughter [phone]  [phone]        I met face to face with patient and family in the home. Palliative Care was asked to follow this patient by consultation request of  Kirk Ruths, MD to address advance care planning and complex medical decision making. This is a follow up visit.                                   ASSESSMENT AND PLAN / RECOMMENDATIONS:   Advance Care Planning/Goals of Care: Goals include to maximize quality of life and symptom management. Patient/health care surrogate gave his/her permission to discuss. Our advance care planning conversation included a discussion about:    The value and importance of advance care planning  Experiences with loved ones who have been seriously ill or have died  Exploration of personal, cultural or spiritual beliefs that might influence medical decisions  Exploration of goals of care in the event of a sudden injury or illness  CODE STATUS: DNR  Symptom Management/Plan:  Mixed Alzheimer's and Vascular dementia-continue to reorient and redirect as needed. Monitor for falls/safety. Continue to encourage patient to use rollator walker for ambulation.   Anxiety and agitation-continue taking Xanax 0.5 mg daily as needed. May  take half dose if sedation. Continue Lexapro 20 mg daily. Will consider Seroquel should her agitation worsen.   Back and Knee Pain - Continue PRN Tylenol and Biofreeze. May add Tylenol dose in morning in addition her evening to dose. Limit heat pad use to 20 minutes at a time. Upcoming appointment for injection to right knee.   Follow up Palliative Care Visit: Palliative care will continue to follow for complex medical decision making, advance care planning, and clarification of goals. Return in 6-8 weeks or prn.   This visit was coded based on medical decision making (MDM).  PPS: 50%  HOSPICE ELIGIBILITY/DIAGNOSIS: TBD  Chief Complaint: Palliative Medicine follow up visit.   HISTORY OF PRESENT ILLNESS:  Brenda Goodman is a 87 y.o. year old female  with  mixed Alzheimer's and vascular dementia, osteoporosis, hypothyroidism, anxiety, CKD 3, anemia, vitamin B12 deficiency, spinal stenosis, chronic nasal polyps and pansinusitis. Diagnosed with dementia about 4.5 years ago.    Reports sundowning is worse. Trialed olanzapine and husband felt her behaviors worsened, so he stopped olanzapine.  Takes alprazolam PRN; taking 1/2 to 1 tablet with effectiveness. Sometimes makes her sleepy. Having some right knee and back pain. Taking Tylenol in morning and using Biofreeze and heat pad on back. Has upcoming injection, Synvisc in right knee. Fell in bedroom about 2 weeks ago. No apparent injury. Needs frequent reminders to use rollator walker. Has a caregiver coming in a couple hours  a week. A 10-point ROS was negative, except for the pertinent positives and negatives detailed per the HPI.   History obtained from review of EMR, discussion with primary team, and interview with family, facility staff/caregiver and/or [name].  I reviewed available labs, medications, imaging, studies and related documents from the EMR.  Records reviewed and summarized above.    Physical Exam: Constitutional:  NAD General: frail appearing, WNWD  EYES: anicteric sclera, lids intact, no discharge  ENMT: intact hearing, oral mucous membranes moist, dentition intact CV: S1S2, RRR, no LE edema Pulmonary: Lung sounds diminished in bases, no increased work of breathing, no cough, room air Abdomen: normo-active BS + 4 quadrants, soft and non tender, no ascites GU: deferred MSK: moves all extremities, ambulatory Skin: warm and dry, no rashes or wounds on visible skin Neuro: generalized weakness,  forgetfulness and cognitive impairment noted Psych: mildly anxious, A and O x 2 Hem/lymph/immuno: no widespread bruising   Thank you for the opportunity to participate in the care of [name].  The palliative care team will continue to follow. Please call our office at [phone] if we can be of additional assistance.   Ezekiel Slocumb, NP   COVID-19 PATIENT SCREENING TOOL Asked and negative response unless otherwise noted:   Have you had symptoms of covid, tested positive or been in contact with someone with symptoms/positive test in the past 5-10 days? No

## 2021-04-22 NOTE — Progress Notes (Signed)
Designer, [facility]ell[facility] Palliative Care Consult Note Telephone: [phone]  Fax: [phone]    Date of encounter: 04/22/21 12:06 PM PATIENT NAME: Brenda Goodman [place] [address]   [phone] (home)  DOB: 02/01/1933 MRN: [id number] PRIMARY CARE PROVIDER:    Kirk Ruths, MD,  [address] [place] [address] [phone]  REFERRING PROVIDER:   Kirk Ruths, MD [place] [facility] [place],  [place] [address] [phone]  RESPONSIBLE PARTY:    Contact Information     Name Relation Home Work [place] Spouse [phone]  [phone]   Bonnee Quin Daughter [phone]  [phone]        I met face to face with patient and family in the home. Palliative Care was asked to follow this patient by consultation request of  Kirk Ruths, MD to address advance care planning and complex medical decision making. This is a follow up visit.                                   ASSESSMENT AND PLAN / RECOMMENDATIONS:   Advance Care Planning/Goals of Care: Goals include to maximize quality of life and symptom management. Patient/health care surrogate gave his/her permission to discuss. Our advance care planning conversation included a discussion about:    The value and importance of advance care planning  Experiences with loved ones who have been seriously ill or have died  Exploration of personal, cultural or spiritual beliefs that might influence medical decisions  Exploration of goals of care in the event of a sudden injury or illness  CODE STATUS: DNR  Symptom Management/Plan:  Mixed Alzheimer's and Vascular Dementia-continue reorienting and redirecting as needed. Monitor for falls and safety; she is encouraged to use rollator walker for ambulation.   Anxiety/agitation- continue taking Xanax 0.5 mg daily as needed. May take half dose if  causing increased sedation. Continue Lexapro 20 mg daily. Will consider Seroquel if agitation persist.   Back and Knee Pain - Continue PRN Tylenol and Biofreeze. May add Tylenol dose in morning in addition to evening dose; max 3000 mg daily.  Limit heat pad use to 20 minutes at a time.  Follow up Palliative Care Visit: Palliative care will continue to follow for complex medical decision making, advance care planning, and clarification of goals. Return in 6-8 weeks or prn.   This visit was coded based on medical decision making (MDM).  PPS: 60%  HOSPICE ELIGIBILITY/DIAGNOSIS: TBD  Chief Complaint: Palliative Medicine follow up visit.   HISTORY OF PRESENT ILLNESS:  Brenda Goodman is a 88 y.o. year old female  with  mixed Alzheimer's and vascular dementia, osteoporosis, hypothyroidism, anxiety, CKD 3, anemia, vitamin B12 deficiency, spinal stenosis, chronic nasal polyps and pansinusitis. Diagnosed with dementia about 4.5 years ago.    Reports sundowning is worse. Takes alprazolam PRN; taking 1/2 to 1 tablet with effectiveness. Sometimes makes her sleepy. Patient was started on olanzapine, but it was stopped as husband felt it was making her behaviors worse. Having some right knee and back pain. Taking Tylenol in morning and using Biofreeze and heat pad on back. Has upcoming injection, Synvisc in right knee. Fell in bedroom about 2 weeks ago. No apparent injury. Needs frequent reminders to use rollator walker. Has a caregiver coming in a couple hours a week.  History obtained from review of EMR, discussion with primary team, and interview with family, facility staff/caregiver and/or Brenda Goodman.  I reviewed available labs, medications, imaging, studies and related documents from the EMR.  Records reviewed and summarized above.   ROS   General: NAD EYES: denies vision changes ENMT: denies dysphagia Cardiovascular: denies chest pain, denies DOE Pulmonary: denies cough, denies increased  SOB Abdomen: endorses good appetite, denies constipation, endorses continence of bowel GU: denies dysuria MSK:  denies increased weakness Skin: denies rashes or wounds Neurological: denies pain, denies insomnia Psych: Endorses positive mood Heme/lymph/immuno: denies bruises, abnormal bleeding  Physical Exam:  Constitutional: NAD General: frail appearing, WNWD  EYES: anicteric sclera, lids intact, no discharge  ENMT: intact hearing, oral mucous membranes moist, dentition intact CV: S1S2, RRR, no LE edema Pulmonary: Lung sounds diminished in bases, no increased work of breathing, no cough, room air Abdomen: normo-active BS + 4 quadrants, soft and non tender, no ascites GU: deferred MSK: moves all extremities, ambulatory Skin: warm and dry, no rashes or wounds on visible skin Neuro: generalized weakness, forgetfulness and cognitive impairment noted, right hand tremor Psych: appears mildly anxious, A and O x 2 Hem/lymph/immuno: no widespread bruising   Thank you for the opportunity to participate in the care of Brenda Goodman.  The palliative care team will continue to follow. Please call our office at [phone] if we can be of additional assistance.   Ezekiel Slocumb, NP   Zachery Dakins, RN-DNP Student    COVID-19 PATIENT SCREENING TOOL Asked and negative response unless otherwise noted:   Have you had symptoms of covid, tested positive or been in contact with someone with symptoms/positive test in the past 5-10 days? No

## 2021-04-28 ENCOUNTER — Ambulatory Visit: Payer: PPO | Admitting: Dermatology

## 2021-05-01 ENCOUNTER — Ambulatory Visit: Payer: PPO | Admitting: Dermatology

## 2021-05-27 ENCOUNTER — Other Ambulatory Visit: Payer: Self-pay | Admitting: Otolaryngology

## 2021-05-27 ENCOUNTER — Ambulatory Visit
Admission: RE | Admit: 2021-05-27 | Discharge: 2021-05-27 | Disposition: A | Discharge status: Home / Self Care | Payer: PPO | Source: Ambulatory Visit | Attending: Otolaryngology | Admitting: Otolaryngology

## 2021-05-27 DIAGNOSIS — R059 Cough, unspecified: Secondary | ICD-10-CM

## 2021-06-02 ENCOUNTER — Other Ambulatory Visit: Payer: PPO | Admitting: Student

## 2021-06-02 ENCOUNTER — Ambulatory Visit
Admission: RE | Admit: 2021-06-02 | Discharge: 2021-06-02 | Disposition: A | Discharge status: Home / Self Care | Payer: PPO | Source: Ambulatory Visit | Attending: Otolaryngology | Admitting: Otolaryngology

## 2021-06-02 ENCOUNTER — Other Ambulatory Visit: Payer: Self-pay | Admitting: Otolaryngology

## 2021-06-02 DIAGNOSIS — Z515 Encounter for palliative care: Secondary | ICD-10-CM

## 2021-06-02 DIAGNOSIS — R059 Cough, unspecified: Secondary | ICD-10-CM

## 2021-06-02 DIAGNOSIS — R52 Pain, unspecified: Secondary | ICD-10-CM

## 2021-06-02 DIAGNOSIS — J40 Bronchitis, not specified as acute or chronic: Secondary | ICD-10-CM

## 2021-06-02 DIAGNOSIS — F01518 Vascular dementia, unspecified severity, with other behavioral disturbance: Secondary | ICD-10-CM

## 2021-06-02 NOTE — Progress Notes (Signed)
? ? ?Manufacturing engineer ?Community Palliative Care Consult Note ?Telephone: [phone]  ?Fax: [phone]  ? ? ?Date of encounter: 06/02/21 ?1:35 PM ?PATIENT NAME: Brenda Goodman ?[place] [address]   ?[phone] (home)  ?DOB: 1932-07-28 ?MRN: [id number] ?PRIMARY CARE PROVIDER:    ?Kirk Ruths, MD,  ?[address] [place] ?[place] [address] ?[phone] ? ?REFERRING PROVIDER:   ?Kirk Ruths, MD ?[place],  [place] [address] ?[phone] ? ?RESPONSIBLE PARTY:    ?Contact Information   ? ? Name Relation Home Work Mobile  ? Sutley,Ronald Spouse [phone]  [phone]  ? Wilder,Corry Daughter [phone]  [phone]  ? ?  ? ? ? ?I met face to face with patient and family in the home. Palliative Care was asked to follow this patient by consultation request of  Kirk Ruths, MD to address advance care planning and complex medical decision making. This is a follow up visit. ? ?                                 ASSESSMENT AND PLAN / RECOMMENDATIONS:  ? ?Advance Care Planning/Goals of Care: Goals include to maximize quality of life and symptom management. Patient/health care surrogate gave his/her permission to discuss. ?Our advance care planning conversation included a discussion about:    ?The value and importance of advance care planning  ?Experiences with loved ones who have been seriously ill or have died  ?Exploration of personal, cultural or spiritual beliefs that might influence medical decisions  ?Exploration of goals of care in the event of a sudden injury or illness  ?CODE STATUS: DNR ? ?Symptom Management/Plan: ? ?Mixed Alzheimer's and Vascular Dementia-continue reorienting and redirecting as needed. Monitor for falls and safety; she is encouraged to use rollator walker for ambulation.  ? ?Back and Knee Pain - Continue PRN Tylenol and Biofreeze. Limit heat pad use to 20 minutes  at a time. ? ?Bronchitis-patient with repeat chest x-ray today. Continue Cefdinir, prednisone, albuterol inhaler every 6 hours PRN, tessalon Perles TID PRN. She has new order for hydrocodone cough syrup and delsym per ENT. Encourage patient to add humidifier.  ? ?Follow up Palliative Care Visit: Palliative care will continue to follow for complex medical decision making, advance care planning, and clarification of goals. Return in 1-2 weeks or prn. ? ? ?This visit was coded based on medical decision making (MDM). ? ?PPS: 50% ? ?HOSPICE ELIGIBILITY/DIAGNOSIS: TBD ? ?Chief Complaint: Palliative  ? ?HISTORY OF PRESENT ILLNESS:  Brenda Goodman is a 88 y.o. year old female  with mixed Alzheimer's and vascular dementia, osteoporosis, hypothyroidism, anxiety, CKD 3, anemia, vitamin B12 deficiency, spinal stenosis, chronic nasal polyps and pansinusitis. Hx of hiatal hernia.  ? ?Patient had been sick with sinus infection in the past week; she has worsening cough, no fever or chills. She had a f/u x-ray today; shows bronchitis. Chronic back and knee pain; taking acetaminophen PRN. Fair appetite; drinking adequate fluids. Seen by ENT today; new orders for hydrocodone cough syrup and delsym. A 10-point ROS is negative, except for the pertinent positives and negatives detailed per the HPI. ? ?History obtained from review of EMR, discussion with primary team, and interview with family, facility staff/caregiver and/or Ms. Cho.  ?I reviewed available labs, medications, imaging, studies and related documents from the EMR.  Records reviewed and summarized above.  ? ? ?  Physical Exam: ?Pulse 72, sats 97% on room air ?Constitutional: NAD ?General: frail appearing, ill appearing ?EYES: anicteric sclera, lids intact, no discharge  ?ENMT: intact hearing, oral mucous membranes moist, dentition intact ?CV: S1S2, RRR, no LE edema ?Pulmonary: No crackles, wheezes or rhonchi auscultated,  increased work of breathing, non productive cough,  room air ?Abdomen: normo-active BS + 4 quadrants, soft and non tender ?GU: deferred ?MSK: no sarcopenia, moves all extremities, ambulatory ?Skin: warm and dry, no rashes or wounds on visible skin ?Neuro:  + generalized weakness,  AQ &  Ox 2, forgetful ?Psych: non-anxious affect ?Hem/lymph/immuno: no widespread bruising ? ? ?Thank you for the opportunity to participate in the care of Ms. Lynch.  The palliative care team will continue to follow. Please call our office at [phone] if we can be of additional assistance.  ? ?Ezekiel Slocumb, NP  ? ?COVID-19 PATIENT SCREENING TOOL ?Asked and negative response unless otherwise noted:  ? ?Have you had symptoms of covid, tested positive or been in contact with someone with symptoms/positive test in the past 5-10 days?  ? ?

## 2021-06-09 ENCOUNTER — Other Ambulatory Visit: Payer: PPO | Admitting: Student

## 2021-06-09 DIAGNOSIS — G309 Alzheimer's disease, unspecified: Secondary | ICD-10-CM

## 2021-06-09 DIAGNOSIS — R52 Pain, unspecified: Secondary | ICD-10-CM

## 2021-06-09 DIAGNOSIS — J40 Bronchitis, not specified as acute or chronic: Secondary | ICD-10-CM

## 2021-06-09 DIAGNOSIS — Z515 Encounter for palliative care: Secondary | ICD-10-CM

## 2021-06-09 NOTE — Progress Notes (Signed)
? ? ?Manufacturing engineer ?Community Palliative Care Consult Note ?Telephone: [phone]  ?Fax: [phone]  ? ? ?Date of encounter: 06/09/21 ?2:45PM ?PATIENT NAME: Brenda Goodman ?[place] [address]   ?[phone] (home)  ?DOB: 09-22-1932 ?MRN: [id number] ?PRIMARY CARE PROVIDER:    ?Brenda Ruths, MD,  ?[address] ?[place] [address] ?[phone] ? ?REFERRING PROVIDER:   ?Brenda Ruths, MD ?[place],  [place] [address] ?[phone] ? ?RESPONSIBLE PARTY:    ?Contact Information   ? ? Name Relation Home Work Mobile  ? Goodman,Brenda Spouse [phone]  [phone]  ? Goodman,Brenda Daughter [phone]  [phone]  ? ?  ? ? ? ?I met face to face with patient and family in the home. Palliative Care was asked to follow this patient by consultation request of  Brenda Ruths, MD to address advance care planning and complex medical decision making. This is a follow up visit. ? ?                                 ASSESSMENT AND PLAN / RECOMMENDATIONS:  ? ?Advance Care Planning/Goals of Care: Goals include to maximize quality of life and symptom management. Patient/health care surrogate gave his/her permission to discuss. ?Our advance care planning conversation included a discussion about:    ?The value and importance of advance care planning  ?Experiences with loved ones who have been seriously ill or have died  ?Exploration of personal, cultural or spiritual beliefs that might influence medical decisions  ?Exploration of goals of care in the event of a sudden injury or illness  ?CODE STATUS: DNR ? ?Education provided on palliative medicine. Will continue to provide supportive care, symptom management.  ? ?Symptom Management/Plan: ? ?Mixed Alzheimer's and Vascular Dementia-continue reorienting and redirecting as needed. Monitor for falls and safety; she is encouraged to use rollator walker or  cane for ambulation. Patient with worsening agitation in evening, sun downs. Husband gives alprazolam PRN; continue escitalopram as directed.  ? ?Bronchitis-patient's cough has improved; she has completed antibiotics and prednisone. She reports feeling much better. Has been taking PRN hydrocodone at night and delsym during the day PRN. ? ?Back and Knee Pain - Continue PRN Tylenol and Biofreeze. Limit heat pad use to 20 minutes at a time ? ?Follow up Palliative Care Visit: Palliative care will continue to follow for complex medical decision making, advance care planning, and clarification of goals. Return in 8 weeks or prn. ? ?This visit was coded based on medical decision making (MDM). ? ?PPS: 50% ? ?HOSPICE ELIGIBILITY/DIAGNOSIS: TBD ? ?Chief Complaint: Palliative Medicine follow up visit.  ? ?HISTORY OF PRESENT ILLNESS:  Brenda Goodman is a 88 y.o. year old female  with mixed Alzheimer's and vascular dementia, osteoporosis, hypothyroidism, anxiety, CKD 3, anemia, vitamin B12 deficiency, spinal stenosis, chronic nasal polyps and pansinusitis. Hx of hiatal hernia.   ? ?Patient reports feeling better; cough has improved. Her appetite is improving. She is sleeping well. Still with some weakness; using cane for ambulation. She does report knee and shoulder pain. Denies nausea, constipation, shortness of breath. A 10-point ROS is negative, except for the pertinent positives and negatives detailed per the HPI. ? ?History obtained from review of EMR, discussion with primary team, and interview with family, facility staff/caregiver and/or Brenda Goodman.  ?I reviewed available labs, medications, imaging, studies and  related documents from the EMR.  Records reviewed and summarized above.  ? ? ?Physical Exam: ?Pulse 84, resp 18, 110/60, sats 95% on room air ?Constitutional: NAD ?General: frail appearing ?EYES: anicteric sclera, lids intact, no discharge  ?ENMT: intact hearing, oral mucous membranes moist, dentition intact ?CV:  S1S2, RRR, no LE edema ?Pulmonary: LCTA, no increased work of breathing, no cough, room air ?Abdomen: normo-active BS + 4 quadrants, soft and non tender ?GU: deferred ?MSK: no sarcopenia, moves all extremities, ambulatory ?Skin: warm and dry, no rashes or wounds on visible skin ?Neuro: generalized weakness, A & O x 2, forgetful ?Psych: non-anxious affect, pleasant ?Hem/lymph/immuno: no widespread bruising ? ? ?Thank you for the opportunity to participate in the care of Brenda Goodman.  The palliative care team will continue to follow. Please call our office at [phone] if we can be of additional assistance.  ? ?Brenda Slocumb, NP  ? ?COVID-19 PATIENT SCREENING TOOL ?Asked and negative response unless otherwise noted:  ? ?Have you had symptoms of covid, tested positive or been in contact with someone with symptoms/positive test in the past 5-10 days? No ? ?

## 2021-06-17 ENCOUNTER — Other Ambulatory Visit: Payer: Self-pay | Admitting: Orthopedic Surgery

## 2021-06-17 ENCOUNTER — Ambulatory Visit
Admission: RE | Admit: 2021-06-17 | Discharge: 2021-06-17 | Disposition: A | Discharge status: Home / Self Care | Payer: PPO | Source: Ambulatory Visit | Attending: Orthopedic Surgery | Admitting: Orthopedic Surgery

## 2021-06-17 DIAGNOSIS — M7989 Other specified soft tissue disorders: Secondary | ICD-10-CM

## 2021-08-11 ENCOUNTER — Other Ambulatory Visit: Payer: PPO | Admitting: Student

## 2021-08-11 DIAGNOSIS — F418 Other specified anxiety disorders: Secondary | ICD-10-CM

## 2021-08-11 DIAGNOSIS — I82411 Acute embolism and thrombosis of right femoral vein: Secondary | ICD-10-CM

## 2021-08-11 DIAGNOSIS — F02818 Dementia in other diseases classified elsewhere, unspecified severity, with other behavioral disturbance: Secondary | ICD-10-CM

## 2021-08-11 DIAGNOSIS — Z515 Encounter for palliative care: Secondary | ICD-10-CM

## 2021-08-11 NOTE — Progress Notes (Signed)
Therapist, nutritional Palliative Care Consult Note Telephone: [phone]  Fax: [phone]   Date of encounter: 08/11/21 1:43 PM PATIENT NAME: Brenda Goodman [address] [place] [address]   [phone] (home)  DOB: 1932/05/14 MRN: [id number] PRIMARY CARE PROVIDER:    Lauro Regulus, MD,  [address] [facility] [place] [address] [phone]  REFERRING PROVIDER:   Lauro Regulus, MD [address] [facility] [place] [place],  [place] [address] [phone]  RESPONSIBLE PARTY:    Contact Information     Name Relation Home Work [place] Spouse [phone]  [phone]   Brenda Goodman Daughter [phone]  [phone]        I met face to face with patient and family in the home. Palliative Care was asked to follow this patient by consultation request of  Brenda Regulus, MD to address advance care planning and complex medical decision making. This is the initial visit.                                     ASSESSMENT AND PLAN / RECOMMENDATIONS:   Advance Care Planning/Goals of Care: Goals include to maximize quality of life and symptom management. Patient/health care surrogate gave his/her permission to discuss.Our advance care planning conversation included a discussion about:    The value and importance of advance care planning  Experiences with loved ones who have been seriously ill or have died  Exploration of personal, cultural or spiritual beliefs that might influence medical decisions  Discussed MOST form again; family to review with family members not present and will complete on next visit.  CODE STATUS: DNR  Education provided on Palliative Medicine. Palliative Medicine will continue to provide ongoing support, symptom management.  Symptom Management/Plan:  Mixed alzheimer's and vascular dementia-patient with increased anxiety/agitation, worse in the evenings.  Recently started on Wellbutrin, continues on Lexapro. Patient has a previous order for alprazolam, which husband used when anxiety worse. Will send in script for PRN dose. Education provided on medications.  Reorient and redirect as needed. Monitor for falls/safety. Discussion today on additional support in the home, private caregivers or day program.   DVT-patient with DVT of RLE; started on Eliquis, continue Eliquis 5 mg BID as directed.   Follow up Palliative Care Visit: Palliative care will continue to follow for complex medical decision making, advance care planning, and clarification of goals. Return in 8 weeks or prn.   This visit was coded based on medical decision making (MDM).  PPS: 50%  HOSPICE ELIGIBILITY/DIAGNOSIS: TBD  Chief Complaint: Palliative Medicine follow up visit.   HISTORY OF PRESENT ILLNESS:  Brenda Goodman is a 88 y.o. year old female  with mixed Alzheimer's and vascular dementia, osteoporosis, hypothyroidism, anxiety, CKD 3, anemia, vitamin B12 deficiency, spinal stenosis, chronic nasal polyps and pansinusitis.   Husband and daughter Brenda Goodman present today. Patient having worsening anxiety/agitation. She has also been more tearful; recently started on Wellbutrin. Increased difficulty getting her to get dressed in morning or her getting ready for bed. Patient started on Eliquis for DVT of right lower extremity. She states her leg pain has greatly improved. She denies shortness of breath, nausea, constipation. Good appetite endorsed. Patient with forgetfulness; she does repeat herself. No anxiety or agitation observed during visit. Patient able to answer direct questions; family contributes to HPI and ROS d/t  her dementia.  History obtained from review of EMR, discussion with primary team, and interview with family, facility staff/caregiver and/or Brenda Goodman.  I reviewed available labs, medications, imaging, studies and related documents from the EMR.  Records reviewed and  summarized above.    Physical Exam: Pulse 76, resp 16, b/p 112/70, sats 97% on room air Constitutional: NAD General: frail appearing EYES: anicteric sclera, lids intact, no discharge  ENMT: intact hearing, oral mucous membranes moist, dentition intact CV: S1S2, RRR, no LE edema Pulmonary: LCTA, no increased work of breathing, no cough, room air Abdomen:  normo-active BS + 4 quadrants, soft and non tender, no ascites GU: deferred MSK: moves all extremities, ambulatory Skin: warm and dry, no rashes or wounds on visible skin Neuro:  generalized weakness,  A & O x 2 Psych: non-anxious affect, pleasant Hem/lymph/immuno: no widespread bruising CURRENT PROBLEM LIST:  Patient Active Problem List   Diagnosis Date Noted   S/P shoulder replacement, right 09/04/2016   PAST MEDICAL HISTORY:  Active Ambulatory Problems    Diagnosis Date Noted   S/P shoulder replacement, right 09/04/2016   Resolved Ambulatory Problems    Diagnosis Date Noted   No Resolved Ambulatory Problems   Past Medical History:  Diagnosis Date   Actinic keratosis 09/11/2015   Anemia    Arthritis    Cancer (HCC)    Cough    Dementia (HCC)    Depression    Diverticulosis    Dizziness    GERD (gastroesophageal reflux disease)    H/O hiatal hernia    Hx of basal cell carcinoma 07/20/2012   Hx of basal cell carcinoma 12/07/2016   Hypothyroidism    PONV (postoperative nausea and vomiting)    Shortness of breath    Squamous cell carcinoma of skin 08/16/2008   Wheezing    SOCIAL HX:  Social History   Tobacco Use   Smoking status: Never   Smokeless tobacco: Never   Tobacco comments:    no passive smoke in home  Substance Use Topics   Alcohol use: No    Comment: occasional   FAMILY HX: No family history on file.    ALLERGIES:  Allergies  Allergen Reactions   Duloxetine Other (See Comments)    confusion     PERTINENT MEDICATIONS:  Outpatient Encounter Medications as of 08/11/2021  Medication Sig    acetaminophen (TYLENOL) 500 MG tablet Take 1,000 mg by mouth every 6 (six) hours as needed for moderate pain.   albuterol (VENTOLIN HFA) 108 (90 Base) MCG/ACT inhaler Inhale 2 puffs into the lungs every 6 (six) hours as needed for wheezing or shortness of breath. (Patient not taking: Reported on 03/20/2021)   ALPRAZolam (XANAX) 0.5 MG tablet Take 0.5 mg by mouth daily as needed for anxiety.   ARTIFICIAL TEAR SOLUTION OP Place 1 drop into both eyes daily as needed (dry eyes).   bismuth subsalicylate (PEPTO BISMOL) 262 MG chewable tablet Chew 262 mg by mouth as needed for diarrhea or loose stools or indigestion.   Brimonidine Tartrate (LUMIFY) 0.025 % SOLN Place 1 drop into both eyes daily as needed (redness).   Calcium Carb-Cholecalciferol (CALCIUM 600 + D PO) Take 1 tablet by mouth every evening.   Cholecalciferol (VITAMIN D) 50 MCG (2000 UT) tablet Take 2,000 Units by mouth daily.   colestipol (COLESTID) 1 G tablet Take 2 g by mouth 2 (two) times daily.   cyanocobalamin (,VITAMIN B-12,) 1000 MCG/ML injection Inject 1,000 mcg into the muscle every 30 (thirty)  days.   diphenhydrAMINE (BENADRYL) 25 MG tablet Take 25 mg by mouth daily as needed for allergies.   escitalopram (LEXAPRO) 20 MG tablet Take 20 mg by mouth daily.   ferrous sulfate 325 (65 FE) MG tablet Take 325 mg by mouth daily with breakfast.   ipratropium (ATROVENT) 0.06 % nasal spray Place 2 sprays into both nostrils daily.   levothyroxine (SYNTHROID, LEVOTHROID) 125 MCG tablet Take 125 mcg by mouth daily before breakfast.   loperamide (IMODIUM A-D) 2 MG tablet Take 2 mg by mouth 4 (four) times daily as needed for diarrhea or loose stools.   Naphazoline-Pheniramine (OPCON-A) 0.027-0.315 % SOLN Place 1 drop into both eyes daily as needed (redness).   omeprazole (PRILOSEC) 20 MG capsule Take 20 mg by mouth daily.   No facility-administered encounter medications on file as of 08/11/2021.   Thank you for the opportunity to participate in  the care of Brenda Goodman.  The palliative care team will continue to follow. Please call our office at [phone] if we can be of additional assistance.   Ezekiel Slocumb, NP   COVID-19 PATIENT SCREENING TOOL Asked and negative response unless otherwise noted:  Have you had symptoms of covid, tested positive or been in contact with someone with symptoms/positive test in the past 5-10 days? No

## 2021-12-19 ENCOUNTER — Encounter: Payer: Self-pay | Admitting: Intensive Care

## 2021-12-19 ENCOUNTER — Other Ambulatory Visit: Payer: Self-pay

## 2021-12-19 ENCOUNTER — Emergency Department
Admission: EM | Admit: 2021-12-19 | Discharge: 2021-12-19 | Disposition: A | Discharge status: Home / Self Care | Payer: PPO | Attending: Emergency Medicine | Admitting: Emergency Medicine

## 2021-12-19 ENCOUNTER — Emergency Department: Payer: PPO

## 2021-12-19 DIAGNOSIS — E039 Hypothyroidism, unspecified: Secondary | ICD-10-CM | POA: Insufficient documentation

## 2021-12-19 DIAGNOSIS — D72829 Elevated white blood cell count, unspecified: Secondary | ICD-10-CM | POA: Diagnosis not present

## 2021-12-19 DIAGNOSIS — K449 Diaphragmatic hernia without obstruction or gangrene: Secondary | ICD-10-CM

## 2021-12-19 DIAGNOSIS — K440 Diaphragmatic hernia with obstruction, without gangrene: Secondary | ICD-10-CM | POA: Diagnosis not present

## 2021-12-19 DIAGNOSIS — K219 Gastro-esophageal reflux disease without esophagitis: Secondary | ICD-10-CM | POA: Diagnosis not present

## 2021-12-19 DIAGNOSIS — R079 Chest pain, unspecified: Secondary | ICD-10-CM | POA: Diagnosis present

## 2021-12-19 DIAGNOSIS — F0390 Unspecified dementia without behavioral disturbance: Secondary | ICD-10-CM | POA: Insufficient documentation

## 2021-12-19 LAB — CBC
HCT: 43.7 % (ref 36.0–46.0)
Hemoglobin: 13.6 g/dL (ref 12.0–15.0)
MCH: 29.2 pg (ref 26.0–34.0)
MCHC: 31.1 g/dL (ref 30.0–36.0)
MCV: 94 fL (ref 80.0–100.0)
Platelets: 320 10*3/uL (ref 150–400)
RBC: 4.65 MIL/uL (ref 3.87–5.11)
RDW: 13.5 % (ref 11.5–15.5)
WBC: 14.8 10*3/uL — ABNORMAL HIGH (ref 4.0–10.5)
nRBC: 0 % (ref 0.0–0.2)

## 2021-12-19 LAB — BASIC METABOLIC PANEL
Anion gap: 10 (ref 5–15)
BUN: 14 mg/dL (ref 8–23)
CO2: 25 mmol/L (ref 22–32)
Calcium: 9.6 mg/dL (ref 8.9–10.3)
Chloride: 104 mmol/L (ref 98–111)
Creatinine, Ser: 0.89 mg/dL (ref 0.44–1.00)
GFR, Estimated: >60 mL/min (ref >60)
Glucose, Bld: 122 mg/dL — ABNORMAL HIGH (ref 70–99)
Potassium: 3.9 mmol/L (ref 3.5–5.1)
Sodium: 139 mmol/L (ref 135–145)

## 2021-12-19 LAB — TROPONIN I (HIGH SENSITIVITY): Troponin I (High Sensitivity): 9 ng/L (ref <18)

## 2021-12-19 MED ORDER — LIDOCAINE VISCOUS HCL 2 % MT SOLN: 15.0000 mL | Freq: Once | OROMUCOSAL | Status: DC

## 2021-12-19 MED ORDER — ALUM & MAG HYDROXIDE-SIMETH 200-200-20 MG/5ML PO SUSP
30.0000 mL | Freq: Once | ORAL | Status: AC
  Administered 2021-12-19: 30 mL via ORAL
  Filled 2021-12-19: qty 30

## 2021-12-19 MED ORDER — SUCRALFATE 1 G PO TABS: 1.0000 g | ORAL_TABLET | Freq: Three times a day (TID) | ORAL | 11 refills | Status: DC

## 2021-12-19 NOTE — ED Provider Notes (Signed)
[facility] Provider Note    Event Date/Time   First MD Initiated Contact with Patient 12/19/21 1419     (approximate)   History   Chief Complaint Chest Pain   HPI Brenda Goodman is a 88 y.o. female, GERD, hypothyroidism, dementia, anemia, presents emergency department for evaluation of chest pain.  Patient states that she was recently diagnosed with a respiratory infection this past week.  She was prescribed cefdinir and prednisone.  She is been taking her medications as prescribed, however she is developed a centralized chest pain has been ongoing for the past day.  She states that it is not hurt at rest, but whenever she drinks any liquids, particularly cold water or hot coffee, she feels a burning sensation along the central part of her chest rating down to just below her xiphoid process.  She does have a history of hiatal hernia.  Denies fever/chills, shortness of breath, abdominal pain, flank pain, nausea/vomiting, diarrhea, dysuria, dizziness/lightheadedness, numb/tingling upper or lower extremities, weakness, headache, vision change, hearing changes  History Limitations: No limitations.        Physical Exam  Triage Vital Signs: ED Triage Vitals  Enc Vitals Group     BP 12/19/21 1327 (!) 140/72     Pulse Rate 12/19/21 1327 61     Resp 12/19/21 1327 20     Temp 12/19/21 1327 98.5 F (36.9 C)     Temp Source 12/19/21 1327 Oral     SpO2 12/19/21 1327 94 %     Weight 12/19/21 1314 147 lb (66.7 kg)     Height 12/19/21 1314 '5\' 4"'$  (1.626 m)     Head Circumference --      Peak Flow --      Pain Score 12/19/21 1313 4     Pain Loc --      Pain Edu? --      Excl. in [place]? --     Most recent vital signs: Vitals:   12/19/21 1430 12/19/21 1500  BP: (!) 124/104 133/66  Pulse: (!) 54 (!) 57  Resp: 19 13  Temp:    SpO2: 99% 100%    General: Awake, NAD.  Skin: Warm, dry. No rashes or lesions.  Eyes: PERRL. Conjunctivae normal.  CV: Good  peripheral perfusion.  S1-S2 present, no murmurs, rubs, or gallops.  No chest wall tenderness. Resp: Normal effort.  Lung sounds are clear bilaterally in the apices and bases. Abd: Soft, non-tender. No distention.  Neuro: At baseline. No gross neurological deficits.  Musculoskeletal: Normal ROM of all extremities.  Focused Exam: Tenderness appreciated in the epigastric region.  Physical Exam    ED Results / Procedures / Treatments  Labs (all labs ordered are listed, but only abnormal results are displayed) Labs Reviewed  BASIC METABOLIC PANEL - Abnormal; Notable for the following components:      Result Value   Glucose, Bld 122 (*)    All other components within normal limits  CBC - Abnormal; Notable for the following components:   WBC 14.8 (*)    All other components within normal limits  TROPONIN I (HIGH SENSITIVITY)  TROPONIN I (HIGH SENSITIVITY)     EKG Sinus rhythm, rate of 60, no T-segment changes, normal QRS, no QT prolongation, no axis deviations, no AV blocks.    RADIOLOGY  ED Provider Interpretation: I personally viewed and interpreted this x-ray, no evidence of active cardiopulmonary disease.  Moderate hiatal hernia present.  DG Chest 2 View  Result Date: 12/19/2021 CLINICAL DATA:  Chest pain. EXAM: CHEST - 2 VIEW COMPARISON:  Chest x-ray June 02, 2021. FINDINGS: Similar cardiomediastinal silhouette. Moderate hiatal hernia. No consolidation. No visible pleural effusions or pneumothorax. Prior distal right clavicle fixation and right reversed total shoulder arthroplasty. Multilevel degenerative change of the spine. IMPRESSION: 1. No active cardiopulmonary disease. 2. Moderate hiatal hernia. Electronically Signed   By: Margaretha Sheffield M.D.   On: 12/19/2021 14:05    PROCEDURES:  Critical Care performed: N/A.  Procedures    MEDICATIONS ORDERED IN ED: Medications  lidocaine (XYLOCAINE) 2 % viscous mouth solution 15 mL (has no administration in time range)   alum & mag hydroxide-simeth (MAALOX/MYLANTA) 200-200-20 MG/5ML suspension 30 mL (30 mLs Oral Given 12/19/21 1454)     IMPRESSION / MDM / ASSESSMENT AND PLAN / ED COURSE  I reviewed the triage vital signs and the nursing notes.                              Differential diagnosis includes, but is not limited to, ACS, myocarditis, pericarditis, GERD, hiatal hernia, esophagitis, Boerhaave's  ED Course Patient appears well, vitals within normal limits.  NAD.   CBC shows mild leukocytosis at 14.8, likely secondary to recent prednisone usage.  Shows no significant electrolyte abnormalities or AKI.  EKG unremarkable.  Initial troponin 9.   Assessment/Plan Patient presents with centralized/epigastric chest pain that started earlier today.  She states that she is asymptomatic at rest, however begins to feel pain whenever she drinks cold water or drinks hot coffee.  Chest x-ray does show a moderate hiatal hernia.  Symptoms highly suggestive of GERD/gastric ulcers.  EKG is unremarkable.  Initial troponin is 9.  She states that she is ready to go home and does not want to wait for the second troponin.  Believe this is reasonable given her history and work-up so far.  She was provided with a GI cocktail here, which resulted in complete resolution of her symptoms.  She is already take omeprazole at home.  We will provide her with a prescription for sucralfate, as well as a referral to gastroenterology for further evaluation.  Patient was amenable with this plan.  Will discharge.  Considered admission for this patient, but given her stable presentation and unremarkable work-up, she is unlikely benefit from admission.  Provided the patient with anticipatory guidance, return precautions, and educational material. Encouraged the patient to return to the emergency department at any time if they begin to experience any new or worsening symptoms. Patient expressed understanding and agreed with the plan.    Patient's presentation is most consistent with acute complicated illness / injury requiring diagnostic workup.       FINAL CLINICAL IMPRESSION(S) / ED DIAGNOSES   Final diagnoses:  Hiatal hernia  Gastroesophageal reflux disease, unspecified whether esophagitis present     Rx / DC Orders   ED Discharge Orders          Ordered    sucralfate (CARAFATE) 1 g tablet  3 times daily with meals & bedtime        12/19/21 1606             Note:  This document was prepared using Dragon voice recognition software and may include unintentional dictation errors.   Teodoro Spray, [place] 12/19/21 1631    Nena Polio, MD 12/21/21 [id number]

## 2021-12-19 NOTE — Discharge Instructions (Signed)
-  You may continue taking your omeprazole as needed.  Recommend 40 mg/day.  -Please take the sucralfate as prescribed.  -Follow-up with a GI specialist listed in these instructions.  You may call them today to schedule an appointment.  Let them know that you were seen here in the emergency department.  -Return to the emergency department anytime if you begin to experience any new or worsening symptoms.

## 2021-12-19 NOTE — ED Notes (Signed)
No change in discomfort following the medication.

## 2021-12-19 NOTE — ED Triage Notes (Addendum)
Patient brought over from [facility] for central chest pain. Intermittent and lasts 2-3 minutes.   Started cefdinir and prednisone this week for cough she has had

## 2022-01-07 ENCOUNTER — Emergency Department: Payer: PPO

## 2022-01-07 ENCOUNTER — Other Ambulatory Visit: Payer: Self-pay

## 2022-01-07 ENCOUNTER — Observation Stay
Admission: EM | Admit: 2022-01-07 | Discharge: 2022-01-09 | Disposition: A | Discharge status: Home Health | Payer: PPO | Attending: Internal Medicine | Admitting: Internal Medicine

## 2022-01-07 ENCOUNTER — Observation Stay
Admit: 2022-01-07 | Discharge: 2022-01-07 | Disposition: A | Discharge status: Home / Self Care | Payer: PPO | Attending: Internal Medicine | Admitting: Internal Medicine

## 2022-01-07 DIAGNOSIS — Z7901 Long term (current) use of anticoagulants: Secondary | ICD-10-CM | POA: Insufficient documentation

## 2022-01-07 DIAGNOSIS — Z85828 Personal history of other malignant neoplasm of skin: Secondary | ICD-10-CM | POA: Diagnosis not present

## 2022-01-07 DIAGNOSIS — Z1152 Encounter for screening for COVID-19: Secondary | ICD-10-CM | POA: Insufficient documentation

## 2022-01-07 DIAGNOSIS — E039 Hypothyroidism, unspecified: Secondary | ICD-10-CM | POA: Diagnosis not present

## 2022-01-07 DIAGNOSIS — R0602 Shortness of breath: Secondary | ICD-10-CM | POA: Insufficient documentation

## 2022-01-07 DIAGNOSIS — R0902 Hypoxemia: Secondary | ICD-10-CM | POA: Insufficient documentation

## 2022-01-07 DIAGNOSIS — J9601 Acute respiratory failure with hypoxia: Secondary | ICD-10-CM | POA: Diagnosis not present

## 2022-01-07 DIAGNOSIS — R2681 Unsteadiness on feet: Secondary | ICD-10-CM | POA: Diagnosis not present

## 2022-01-07 DIAGNOSIS — Z96611 Presence of right artificial shoulder joint: Secondary | ICD-10-CM | POA: Diagnosis not present

## 2022-01-07 DIAGNOSIS — F0390 Unspecified dementia without behavioral disturbance: Secondary | ICD-10-CM | POA: Insufficient documentation

## 2022-01-07 DIAGNOSIS — R059 Cough, unspecified: Secondary | ICD-10-CM | POA: Diagnosis not present

## 2022-01-07 DIAGNOSIS — R058 Other specified cough: Secondary | ICD-10-CM | POA: Insufficient documentation

## 2022-01-07 DIAGNOSIS — F32A Depression, unspecified: Secondary | ICD-10-CM | POA: Insufficient documentation

## 2022-01-07 DIAGNOSIS — K449 Diaphragmatic hernia without obstruction or gangrene: Secondary | ICD-10-CM | POA: Insufficient documentation

## 2022-01-07 DIAGNOSIS — R4182 Altered mental status, unspecified: Secondary | ICD-10-CM | POA: Insufficient documentation

## 2022-01-07 DIAGNOSIS — M6281 Muscle weakness (generalized): Secondary | ICD-10-CM | POA: Insufficient documentation

## 2022-01-07 DIAGNOSIS — R531 Weakness: Secondary | ICD-10-CM | POA: Insufficient documentation

## 2022-01-07 DIAGNOSIS — R052 Subacute cough: Secondary | ICD-10-CM

## 2022-01-07 DIAGNOSIS — R262 Difficulty in walking, not elsewhere classified: Secondary | ICD-10-CM | POA: Insufficient documentation

## 2022-01-07 DIAGNOSIS — Z86718 Personal history of other venous thrombosis and embolism: Secondary | ICD-10-CM | POA: Insufficient documentation

## 2022-01-07 DIAGNOSIS — Z79899 Other long term (current) drug therapy: Secondary | ICD-10-CM | POA: Diagnosis not present

## 2022-01-07 DIAGNOSIS — K219 Gastro-esophageal reflux disease without esophagitis: Secondary | ICD-10-CM

## 2022-01-07 LAB — RESPIRATORY PANEL BY PCR

## 2022-01-07 LAB — COMPREHENSIVE METABOLIC PANEL
ALT: 19 U/L (ref 0–44)
AST: 25 U/L (ref 15–41)
Albumin: 3.3 g/dL — ABNORMAL LOW (ref 3.5–5.0)
Alkaline Phosphatase: 81 U/L (ref 38–126)
Anion gap: 8 (ref 5–15)
BUN: 9 mg/dL (ref 8–23)
CO2: 29 mmol/L (ref 22–32)
Calcium: 9.6 mg/dL (ref 8.9–10.3)
Chloride: 105 mmol/L (ref 98–111)
Creatinine, Ser: 0.98 mg/dL (ref 0.44–1.00)
GFR, Estimated: 56 mL/min — ABNORMAL LOW (ref >60)
Glucose, Bld: 109 mg/dL — ABNORMAL HIGH (ref 70–99)
Potassium: 4.5 mmol/L (ref 3.5–5.1)
Sodium: 142 mmol/L (ref 135–145)
Total Bilirubin: 0.8 mg/dL (ref 0.3–1.2)
Total Protein: 6.9 g/dL (ref 6.5–8.1)

## 2022-01-07 LAB — CBC WITH DIFFERENTIAL/PLATELET
Abs Immature Granulocytes: 0.06 10*3/uL (ref 0.00–0.07)
Basophils Absolute: 0.1 10*3/uL (ref 0.0–0.1)
Basophils Relative: 0 %
Eosinophils Absolute: 0.2 10*3/uL (ref 0.0–0.5)
Eosinophils Relative: 1 %
HCT: 40.2 % (ref 36.0–46.0)
Hemoglobin: 12.6 g/dL (ref 12.0–15.0)
Immature Granulocytes: 1 %
Lymphocytes Relative: 12 %
Lymphs Abs: 1.4 10*3/uL (ref 0.7–4.0)
MCH: 28.9 pg (ref 26.0–34.0)
MCHC: 31.3 g/dL (ref 30.0–36.0)
MCV: 92.2 fL (ref 80.0–100.0)
Monocytes Absolute: 0.8 10*3/uL (ref 0.1–1.0)
Monocytes Relative: 7 %
Neutro Abs: 9.3 10*3/uL — ABNORMAL HIGH (ref 1.7–7.7)
Neutrophils Relative %: 79 %
Platelets: 332 10*3/uL (ref 150–400)
RBC: 4.36 MIL/uL (ref 3.87–5.11)
RDW: 13.4 % (ref 11.5–15.5)
WBC: 11.7 10*3/uL — ABNORMAL HIGH (ref 4.0–10.5)
nRBC: 0 % (ref 0.0–0.2)

## 2022-01-07 LAB — LACTIC ACID, PLASMA
Lactic Acid, Venous: 2.1 mmol/L (ref 0.5–1.9)
Lactic Acid, Venous: 1.6 mmol/L (ref 0.5–1.9)

## 2022-01-07 LAB — URINALYSIS, COMPLETE (UACMP) WITH MICROSCOPIC
Bacteria, UA: NONE SEEN
Bilirubin Urine: NEGATIVE
Glucose, UA: NEGATIVE mg/dL
Hgb urine dipstick: NEGATIVE
Ketones, ur: 5 mg/dL — AB
Leukocytes,Ua: NEGATIVE
Nitrite: NEGATIVE
Protein, ur: NEGATIVE mg/dL
Specific Gravity, Urine: >1.046 — ABNORMAL HIGH (ref 1.005–1.030)
pH: 5 (ref 5.0–8.0)

## 2022-01-07 LAB — TROPONIN I (HIGH SENSITIVITY)
Troponin I (High Sensitivity): 9 ng/L (ref <18)
Troponin I (High Sensitivity): 9 ng/L (ref <18)

## 2022-01-07 LAB — TSH: TSH: 2.526 u[IU]/mL (ref 0.350–4.500)

## 2022-01-07 LAB — T4, FREE: Free T4: 1.03 ng/dL (ref 0.61–1.12)

## 2022-01-07 LAB — RESP PANEL BY RT-PCR (FLU A&B, COVID) ARPGX2
Influenza A by PCR: NEGATIVE
Influenza B by PCR: NEGATIVE
SARS Coronavirus 2 by RT PCR: NEGATIVE

## 2022-01-07 MED ORDER — SUCRALFATE 1 G PO TABS
1.0000 g | ORAL_TABLET | Freq: Three times a day (TID) | ORAL | Status: DC
  Administered 2022-01-07 – 2022-01-09 (×7): 1 g via ORAL
  Filled 2022-01-07 (×7): qty 1

## 2022-01-07 MED ORDER — DOXYCYCLINE HYCLATE 100 MG PO TABS
100.0000 mg | ORAL_TABLET | Freq: Two times a day (BID) | ORAL | Status: DC
  Administered 2022-01-07 – 2022-01-08 (×2): 100 mg via ORAL
  Filled 2022-01-07 (×2): qty 1

## 2022-01-07 MED ORDER — SODIUM CHLORIDE 0.9% FLUSH
3.0000 mL | Freq: Two times a day (BID) | INTRAVENOUS | Status: DC
  Administered 2022-01-07 – 2022-01-09 (×4): 3 mL via INTRAVENOUS

## 2022-01-07 MED ORDER — ESCITALOPRAM OXALATE 10 MG PO TABS
20.0000 mg | ORAL_TABLET | Freq: Every day | ORAL | Status: DC
  Administered 2022-01-08 – 2022-01-09 (×2): 20 mg via ORAL
  Filled 2022-01-07 (×2): qty 2

## 2022-01-07 MED ORDER — GUAIFENESIN-DM 100-10 MG/5ML PO SYRP
5.0000 mL | ORAL_SOLUTION | ORAL | Status: DC | PRN
  Administered 2022-01-07 – 2022-01-09 (×4): 5 mL via ORAL
  Filled 2022-01-07 (×4): qty 10

## 2022-01-07 MED ORDER — IOHEXOL 350 MG/ML SOLN
75.0000 mL | Freq: Once | INTRAVENOUS | Status: AC | PRN
  Administered 2022-01-07: 75 mL via INTRAVENOUS

## 2022-01-07 MED ORDER — VITAMIN D 25 MCG (1000 UNIT) PO TABS
2000.0000 [IU] | ORAL_TABLET | Freq: Every day | ORAL | Status: DC
  Administered 2022-01-08 – 2022-01-09 (×2): 2000 [IU] via ORAL
  Filled 2022-01-07 (×2): qty 2

## 2022-01-07 MED ORDER — PANTOPRAZOLE SODIUM 40 MG PO TBEC
40.0000 mg | DELAYED_RELEASE_TABLET | Freq: Every day | ORAL | Status: DC
  Administered 2022-01-08: 40 mg via ORAL
  Filled 2022-01-07: qty 1

## 2022-01-07 MED ORDER — LEVOTHYROXINE SODIUM 125 MCG PO TABS
125.0000 ug | ORAL_TABLET | Freq: Every day | ORAL | Status: DC
  Administered 2022-01-08 – 2022-01-09 (×2): 125 ug via ORAL
  Filled 2022-01-07 (×2): qty 1

## 2022-01-07 MED ORDER — SODIUM CHLORIDE 0.9 % IV SOLN: 250.0000 mL | INTRAVENOUS | Status: DC | PRN

## 2022-01-07 MED ORDER — SODIUM CHLORIDE 0.9 % IV BOLUS
1000.0000 mL | Freq: Once | INTRAVENOUS | Status: AC
  Administered 2022-01-07: 1000 mL via INTRAVENOUS

## 2022-01-07 MED ORDER — FERROUS SULFATE 325 (65 FE) MG PO TABS
325.0000 mg | ORAL_TABLET | Freq: Every day | ORAL | Status: DC
  Administered 2022-01-08: 325 mg via ORAL
  Filled 2022-01-07: qty 1

## 2022-01-07 MED ORDER — ACETAMINOPHEN 500 MG PO TABS: 1000.0000 mg | ORAL_TABLET | Freq: Four times a day (QID) | ORAL | Status: DC | PRN

## 2022-01-07 MED ORDER — IPRATROPIUM-ALBUTEROL 0.5-2.5 (3) MG/3ML IN SOLN
3.0000 mL | Freq: Four times a day (QID) | RESPIRATORY_TRACT | Status: DC | PRN
  Administered 2022-01-07 – 2022-01-08 (×2): 3 mL via RESPIRATORY_TRACT
  Filled 2022-01-07 (×2): qty 3

## 2022-01-07 MED ORDER — ONDANSETRON HCL 4 MG/2ML IJ SOLN: 4.0000 mg | Freq: Four times a day (QID) | INTRAMUSCULAR | Status: DC | PRN

## 2022-01-07 MED ORDER — HALOPERIDOL LACTATE 5 MG/ML IJ SOLN
1.0000 mg | Freq: Once | INTRAMUSCULAR | Status: AC
  Administered 2022-01-07: 1 mg via INTRAVENOUS
  Filled 2022-01-07: qty 1

## 2022-01-07 MED ORDER — SODIUM CHLORIDE 0.9% FLUSH: 3.0000 mL | INTRAVENOUS | Status: DC | PRN

## 2022-01-07 MED ORDER — ONDANSETRON HCL 4 MG PO TABS: 4.0000 mg | ORAL_TABLET | Freq: Four times a day (QID) | ORAL | Status: DC | PRN

## 2022-01-07 MED ORDER — ENOXAPARIN SODIUM 40 MG/0.4ML IJ SOSY
40.0000 mg | PREFILLED_SYRINGE | INTRAMUSCULAR | Status: DC
  Administered 2022-01-07 – 2022-01-08 (×2): 40 mg via SUBCUTANEOUS
  Filled 2022-01-07 (×2): qty 0.4

## 2022-01-07 NOTE — ED Notes (Signed)
Pt A&Ox4. Pt VBG obtained. Pt fluids running.

## 2022-01-07 NOTE — ED Provider Notes (Signed)
[facility] Provider Note    Event Date/Time   First MD Initiated Contact with Patient 01/07/22 1051     (approximate)   History   Weakness   HPI  Brenda Goodman is a 88 y.o. female   Past medical history of Alzheimer's dementia, skin cancer, hypothyroid history of DVT no longer on anticoagulation who presents to the emergency department with several weeks of cough, has been given prednisone, but continues to cough and feel intermittently short of breath, generalized weakness and altered mental status from baseline progressively worsening over the last several days.  She lives at home with her husband and caregiver.  Denies fever or chills.  No nausea vomiting or diarrhea.  P.o. intake has been normal.  No chest pain or abdominal pain.  Usually is ambulatory with walker, but generalized weakness worsened to the point where she is no longer to do so.  History was obtained via the patient.  Given her mental status changes, independent historians including caregiver and husband are at bedside to offer collateral information as above.      Physical Exam   Triage Vital Signs: ED Triage Vitals  Enc Vitals Group     BP 01/07/22 1049 101/70     Pulse Rate 01/07/22 1049 88     Resp 01/07/22 1049 15     Temp 01/07/22 1049 98 F (36.7 C)     Temp Source 01/07/22 1049 Oral     SpO2 01/07/22 1049 92 %     Weight 01/07/22 1048 146 lb 13.2 oz (66.6 kg)     Height 01/07/22 1048 '5\' 4"'$  (1.626 m)     Head Circumference --      Peak Flow --      Pain Score 01/07/22 1048 0     Pain Loc --      Pain Edu? --      Excl. in [place]? --     Most recent vital signs: Vitals:   01/07/22 1330 01/07/22 1400  BP: 123/83 132/73  Pulse: 76 69  Resp: 16 19  Temp:    SpO2: 96% 96%    General: Awake, no distress.  Does not and conversant, disoriented.  Follows all commands, moves all extremities. CV:  Good peripheral perfusion.  Normotensive and not  tachycardic. Resp:  Normal effort.  Lungs clear to auscultation without wheezing or focality or rales, oxygen saturation is 90% on room air.  No respiratory distress. Abd:  No distention.  Nontender to palpation Other:  Nontoxic appearance.   ED Results / Procedures / Treatments   Labs (all labs ordered are listed, but only abnormal results are displayed) Labs Reviewed  LACTIC ACID, PLASMA - Abnormal; Notable for the following components:      Result Value   Lactic Acid, Venous 2.1 (*)    All other components within normal limits  COMPREHENSIVE METABOLIC PANEL - Abnormal; Notable for the following components:   Glucose, Bld 109 (*)    Albumin 3.3 (*)    GFR, Estimated 56 (*)    All other components within normal limits  CBC WITH DIFFERENTIAL/PLATELET - Abnormal; Notable for the following components:   WBC 11.7 (*)    Neutro Abs 9.3 (*)    All other components within normal limits  RESP PANEL BY RT-PCR (FLU A&B, COVID) ARPGX2  TSH  T4, FREE  LACTIC ACID, PLASMA  URINALYSIS, COMPLETE (UACMP) WITH MICROSCOPIC  TROPONIN I (HIGH SENSITIVITY)  TROPONIN I (HIGH SENSITIVITY)  I reviewed labs and they are notable for mild leukocytosis with a white blood cell count 11.7, in the setting of taking prednisone at home.  EKG  ED ECG REPORT I, Lucillie Garfinkel, the attending physician, personally viewed and interpreted this ECG.   Date: 01/07/2022  EKG Time: 1145  Rate: 87  Rhythm: normal sinus rhythm  Axis: nl  Intervals:none  ST&T Change: No acute ischemic changes    RADIOLOGY I independently reviewed and interpreted chest x-ray and see no focal opacities or pneumothorax.   PROCEDURES:  Critical Care performed: No  Procedures   MEDICATIONS ORDERED IN ED: Medications  sodium chloride 0.9 % bolus 1,000 mL (1,000 mLs Intravenous New Bag/Given 01/07/22 1250)  iohexol (OMNIPAQUE) 350 MG/ML injection 75 mL (75 mLs Intravenous Contrast Given 01/07/22 1336)    Consultants:   I spoke with hospitalist for admission and regarding care plan for this patient.   IMPRESSION / MDM / ASSESSMENT AND PLAN / ED COURSE  I reviewed the triage vital signs and the nursing notes.                              Differential diagnosis includes, but is not limited to, bronchitis, viral upper respiratory infection, bacterial pneumonia, ACS, PE, other infections like urinary tract infection, metabolic derangements or AKI, dehydration   The patient is on the cardiac monitor to evaluate for evidence of arrhythmia and/or significant heart rate changes.  MDM: This is a patient with subacute cough that has worsening that does not appear toxic or septic at the moment, but has been progressively on the decline with mental status and progressive weakness.  She has no other significant complaints other than cough and shortness of breath.  She has had a history of DVT no longer on anticoagulation, will check a CT angio of the chest for PE.  Viral swabs for viral pneumonia.  No evidence of bacterial pneumonia on chest x-ray.  She appears euvolemic doubt fluid overload.  No chest pain to indicate ACS, will check EKG and troponins.  Given her symptoms have been progressively worsening and is no longer at her baseline ambulatory status at home, will require admission.  Fortunately the patient has normal troponins and a negative CTA for bacterial pneumonia or blood clot.  However, she became hypoxemic 89% on room air and required 2 L nasal cannula and has been saturating in the 95 to 97%.  Otherwise hemodynamics have been reassuring.  Given her departure from baseline ambulatory status due to her illness and new oxygen requirement she will be admitted to the hospitalist service.   Patient's presentation is most consistent with acute presentation with potential threat to life or bodily function.       FINAL CLINICAL IMPRESSION(S) / ED DIAGNOSES   Final diagnoses:  Subacute cough  Generalized  weakness  Hypoxemia     Rx / DC Orders   ED Discharge Orders     None        Note:  This document was prepared using Dragon voice recognition software and may include unintentional dictation errors.    Lucillie Garfinkel, MD 01/07/22 1440

## 2022-01-07 NOTE — ED Notes (Signed)
Pt arm bent not allowing NS bolus to flow into R AC IV. Pt and family aware need to keep arm straight fluids currently flowing into IV.

## 2022-01-07 NOTE — ED Triage Notes (Signed)
C/O cough for 'weeks'.  Has been on prednisone.  Diagnosed with bronchitis.  Patients daughter states cough seemed to worsen overnight and mom is more confused today and mobility has decreased.

## 2022-01-07 NOTE — ED Notes (Signed)
Oxygen turned off. Currently 96% on RA. Will monitor.

## 2022-01-07 NOTE — Assessment & Plan Note (Addendum)
Unclear etiology Patient was said to have room air pulse oximetry of 88% Currently on 2 L of oxygen with pulse oximetry of 98% No known history of COPD Concern for possible silent aspiration due to her known history of hiatal hernia/GERD and worsening dementia Speech therapy consult for swallow function evaluation Obtain 2D echocardiogram to assess LVEF and check pulmonary artery pressures As needed bronchodilator therapy Attempt to wean off oxygen as tolerated

## 2022-01-07 NOTE — ED Notes (Signed)
Second lactic to be drawn after fluid bolus.

## 2022-01-07 NOTE — ED Notes (Signed)
Pt placed on RA per MD [place]. Pt SPO2 stable at 98% currently.

## 2022-01-07 NOTE — Assessment & Plan Note (Signed)
Continue Lexapro

## 2022-01-07 NOTE — ED Notes (Signed)
Informed RN bed assigned 

## 2022-01-07 NOTE — ED Notes (Signed)
Pt removing oxygen and crying, very anxious. Messaged provider to request anxiolytic. Daughter states she has sundowning each afternoon.

## 2022-01-07 NOTE — ED Notes (Signed)
Provider informed that lactic was 2.1. See new orders.

## 2022-01-07 NOTE — ED Notes (Signed)
See triage note. Pt has ongoing cough for weeks, worse last night, being treated with prednisone. Pt has been more confused than usual.

## 2022-01-07 NOTE — ED Notes (Signed)
Dr Francine Graven gave verbal order to discontinue IV bolus. Pt is becoming anxious and crying with the need to keep arm straight. Daughter at bedside reassuring pt.

## 2022-01-07 NOTE — Assessment & Plan Note (Signed)
Patient has a history of hiatal hernia with GERD Continue omeprazole and Carafate

## 2022-01-07 NOTE — ED Notes (Signed)
Pt still has not urinated.

## 2022-01-07 NOTE — Assessment & Plan Note (Signed)
Continue Synthroid °

## 2022-01-07 NOTE — Progress Notes (Signed)
PRN Duoneb given at 2256.PT reports no change.

## 2022-01-07 NOTE — ED Notes (Addendum)
Pt fluids started. Pt SPO2 range in high 80s so placed on 2L [place] Pt O2 is now 96%. Family at bedside.

## 2022-01-07 NOTE — ED Notes (Signed)
[name] changed to 1L per MD request.

## 2022-01-07 NOTE — H&P (Signed)
History and Physical    Patient: Brenda Goodman:[id number] DOB: 05-Nov-1932 DOA: 01/07/2022 DOS: the patient was seen and examined on 01/07/2022 PCP: Kirk Ruths, MD  Patient coming from: Home  Chief Complaint:  Chief Complaint  Patient presents with   Weakness   Most of the history is obtained from patient's daughter and husband at the bedside. HPI: Brenda Goodman is a 88 y.o. female with medical history significant for Alzheimer's dementia, hypothyroidism, history of DVT no longer on anticoagulation, GERD who presents to the ER via private vehicle for evaluation of a persistent cough that she has had for over a week. Patient's husband notes that she has had a cough for over a week.  Cough is nonproductive and appears to be worse when she lays flat.  He is unable to tell me if she has associated shortness of breath because she does not walk a lot.  She has not had any sick contacts and has not had any fever or chills.  She has no leg swelling. She was seen at an urgent care clinic and prescribed prednisone but has not had any improvement. Daughter is concerned that the cough is worse and that the patient appears more confused when compared to her baseline. Husband also notes that she had a choking episode about a month ago on rice and he mostly feeds her semisolid's now. She had an episode of emesis and diarrhea but that has resolved I am unable to do review of systems on this patient that she is very tearful and anxious. Per ER provider she had room air pulse oximetry of 88% and was placed on 2 L of oxygen with improvement in her pulse oximetry to 98% She had a CT angiogram which showed no evidence of pulmonary embolism or other acute intrathoracic abnormality. Cardiomegaly with coronary artery atherosclerotic calcifications. Pulmonary trunk is dilated concerning for pulmonary arterial hypertension. Moderate size hiatal hernia. Marked thoracic kyphoscoliosis. She will be  referred to observation status for further evaluation   Review of Systems: unable to review all systems due to the inability of the patient to answer questions. Past Medical History:  Diagnosis Date   Actinic keratosis 09/11/2015   right nasal bridge   Anemia    followed by pcp and unconcerned.   Arthritis    Cancer ([place])    skin   Cough    CHRONIC   Dementia (HCC)    Depression    Diverticulosis    Dizziness    possibly cardiac related   GERD (gastroesophageal reflux disease)    H/O hiatal hernia    Hx of basal cell carcinoma 07/20/2012   L posterior neck   Hx of basal cell carcinoma 12/07/2016   R proximal medial calf   Hypothyroidism    PONV (postoperative nausea and vomiting)    Shortness of breath    COUGH , magnesium helps this   Squamous cell carcinoma of skin 08/16/2008   R lower leg, excised 10/10/2008   Wheezing    Past Surgical History:  Procedure Laterality Date   CARDIOVASCULAR STRESS TEST     2012   [place]      CATARACT EXTRACTION W/PHACO Right 02/26/2015   Procedure: CATARACT EXTRACTION PHACO AND INTRAOCULAR LENS PLACEMENT ([place]);  Surgeon: Birder Robson, MD;  Location: [facility];  Service: Ophthalmology;  Laterality: Right;  [place]  02:00 AP% 27.4 CDE 32.96 fluid pack lot # [id number]   CATARACT EXTRACTION W/PHACO Left 03/19/2015   Procedure:  CATARACT EXTRACTION PHACO AND INTRAOCULAR LENS PLACEMENT ([facility]);  Surgeon: Birder Robson, MD;  Location: [facility];  Service: Ophthalmology;  Laterality: Left;  [place] 02:07 AP% 26.0 CDE 33.14 fluid pack lot # [id number]   CATARACT EXTRACTION, BILATERAL     CHOLECYSTECTOMY     EYE SURGERY     hardware in right shoulder Right 2010   ball and pin in shoulder   HERNIA REPAIR     2012   JOINT REPLACEMENT     LUMBAR LAMINECTOMY/DECOMPRESSION MICRODISCECTOMY  05/04/2011   Procedure: LUMBAR LAMINECTOMY/DECOMPRESSION MICRODISCECTOMY 1 LEVEL;  Surgeon: Ophelia Charter, MD;  Location: [place] [facility];  Service:  Neurosurgery;  Laterality: N/A;  Lumbar three and lumbar four Laminectomy   NO PAST SURGERIES     RIGHT ARM SURGERY BALL + PIN    REVERSE SHOULDER ARTHROPLASTY Right 09/04/2016   Procedure: RIGHT REVERSE TOTAL SHOULDER ARTHROPLASTY REVISION;  Surgeon: Netta Cedars, MD;  Location: [place];  Service: Orthopedics;  Laterality: Right;   THYROID LOBECTOMY  2016    APPROX 4 YRS AGO    THYROIDECTOMY     TONSILLECTOMY     TOTAL SHOULDER ARTHROPLASTY     Right   Social History:  reports that she has never smoked. She has never used smokeless tobacco. She reports that she does not drink alcohol and does not use drugs.  Allergies  Allergen Reactions   Duloxetine Other (See Comments)    confusion    No family history on file.  Prior to Admission medications   Medication Sig Start Date End Date Taking? Authorizing Provider  Calcium Carb-Cholecalciferol (CALCIUM 600 + D PO) Take 1 tablet by mouth every evening.   Yes [provider]  Cholecalciferol (VITAMIN D) 50 MCG (2000 UT) tablet Take 2,000 Units by mouth daily.   Yes [provider]  doxycycline (VIBRAMYCIN) 100 MG capsule Take 100 mg by mouth 2 (two) times daily. 01/05/22  Yes [provider]  escitalopram (LEXAPRO) 20 MG tablet Take 20 mg by mouth daily.   Yes [provider]  ferrous sulfate 325 (65 FE) MG tablet Take 325 mg by mouth daily with breakfast.   Yes [provider]  ipratropium (ATROVENT) 0.06 % nasal spray Place 2 sprays into both nostrils daily.   Yes [provider]  levothyroxine (SYNTHROID, LEVOTHROID) 125 MCG tablet Take 125 mcg by mouth daily before breakfast.   Yes [provider]  omeprazole (PRILOSEC) 20 MG capsule Take 20 mg by mouth daily.   Yes [provider]  predniSONE (DELTASONE) 10 MG tablet Take 10 mg by mouth daily with breakfast.   Yes [provider]  sucralfate (CARAFATE) 1 g tablet Take 1 tablet (1 g total) by mouth 4 (four)  times daily -  with meals and at bedtime. 12/19/21 12/19/22 Yes Teodoro Spray, PA  acetaminophen (TYLENOL) 500 MG tablet Take 1,000 mg by mouth every 6 (six) hours as needed for moderate pain.    [provider]  albuterol (VENTOLIN HFA) 108 (90 Base) MCG/ACT inhaler Inhale 2 puffs into the lungs every 6 (six) hours as needed for wheezing or shortness of breath.    [provider]  ALPRAZolam Duanne Moron) 0.5 MG tablet Take 0.5 mg by mouth daily as needed for anxiety. Patient not taking: Reported on 01/07/2022    [provider]  apixaban (ELIQUIS) 5 MG TABS tablet Take 5 mg by mouth 2 (two) times daily. Patient not taking: Reported on 01/07/2022  [provider]  ARTIFICIAL TEAR SOLUTION OP Place 1 drop into both eyes daily as needed (dry eyes). Patient not taking: Reported on 01/07/2022    [provider]  bismuth subsalicylate (PEPTO BISMOL) 262 MG chewable tablet Chew 262 mg by mouth as needed for diarrhea or loose stools or indigestion.    [provider]  Brimonidine Tartrate (LUMIFY) 0.025 % SOLN Place 1 drop into both eyes daily as needed (redness). Patient not taking: Reported on 01/07/2022    [provider]  buPROPion (WELLBUTRIN XL) 150 MG 24 hr tablet Take 150 mg by mouth daily. Patient not taking: Reported on 01/07/2022    [provider]  colestipol (COLESTID) 1 G tablet Take 2 g by mouth 2 (two) times daily. Patient not taking: Reported on 01/07/2022    [provider]  cyanocobalamin (,VITAMIN B-12,) 1000 MCG/ML injection Inject 1,000 mcg into the muscle every 30 (thirty) days. 03/25/20   [provider]  diphenhydrAMINE (BENADRYL) 25 MG tablet Take 25 mg by mouth daily as needed for allergies.    [provider]  loperamide (IMODIUM A-D) 2 MG tablet Take 2 mg by mouth 4 (four) times daily as needed for diarrhea or loose stools.    [provider]  Naphazoline-Pheniramine  (OPCON-A) 0.027-0.315 % SOLN Place 1 drop into both eyes daily as needed (redness). Patient not taking: Reported on 01/07/2022    [provider]    Physical Exam: Vitals:   01/07/22 1500 01/07/22 1530 01/07/22 1600 01/07/22 1603  BP: 126/75 123/88    Pulse: 72 76 75 80  Resp: 19 (!) 21 (!) 26 20  Temp:      TempSrc:      SpO2: 95% 100% 95% 94%  Weight:      Height:       Physical Exam Vitals and nursing note reviewed.  Constitutional:      Comments: Very tearful and anxious.  HENT:     Head: Normocephalic and atraumatic.     Nose: Nose normal.     Mouth/Throat:     Mouth: Mucous membranes are moist.  Eyes:     Conjunctiva/sclera: Conjunctivae normal.  Cardiovascular:     Rate and Rhythm: Normal rate and regular rhythm.  Pulmonary:     Effort: Pulmonary effort is normal.     Breath sounds: Normal breath sounds.  Abdominal:     General: Abdomen is flat. Bowel sounds are normal.     Palpations: Abdomen is soft.  Musculoskeletal:        General: Normal range of motion.     Cervical back: Normal range of motion and neck supple.  Skin:    General: Skin is warm and dry.  Neurological:     Mental Status: She is alert.     Comments: Oriented to person  Psychiatric:     Comments: Anxious     Data Reviewed: Relevant notes from primary care and specialist visits, past discharge summaries as available in EHR, including Care Everywhere. Prior diagnostic testing as pertinent to current admission diagnoses Updated medications and problem lists for reconciliation ED course, including vitals, labs, imaging, treatment and response to treatment Triage notes, nursing and pharmacy notes and ED provider's notes Notable results as noted in HPI Labs reviewed. VBG 7.38/45/47/26.6/81 Troponin 9, TSH 2.52, free T41.03, lactic acid 2.1, sodium 142, potassium 4.5, chloride 105, bicarb 29, glucose 109, BUN 9, creatinine 0.98, calcium 9.6, total protein 6.9, albumin 3.3, AST 25,  ALT 19,  alkaline phosphatase 81, total bilirubin 0.8, white count 11.7, hemoglobin 12.6, hematocrit 40.2, platelet count 332 Chest x-ray reviewed by me shows No evidence of pneumonia. Cardiomegaly. Aortic Atherosclerosis There are no new results to review at this time.  Assessment and Plan: * Acute hypoxic respiratory failure ([place]) Unclear etiology Patient was said to have room air pulse oximetry of 88% Currently on 2 L of oxygen with pulse oximetry of 98% No known history of COPD Concern for possible silent aspiration due to her known history of hiatal hernia/GERD and worsening dementia Speech therapy consult for swallow function evaluation Obtain 2D echocardiogram to assess LVEF and check pulmonary artery pressures As needed bronchodilator therapy Attempt to wean off oxygen as tolerated   Depression Continue Lexapro  Dementia ([place]) Patient noted to be very anxious and agitated. Only notes worsening mental status changes and patient has been on prednisone for bronchitis She will need a sitter and her daughter will stay with her during this hospitalization Discontinue prednisone We will give a dose of Haldol for her agitation  Hiatal hernia with gastroesophageal reflux Patient has a history of hiatal hernia with GERD Continue omeprazole and Carafate  Hypothyroidism Continue Synthroid      Advance Care Planning:   Code Status: DNR   Consults: Speech therapy  Family Communication: Greater than 50% of time was spent discussing patient's condition and plan of care with patient's husband and daughter at the bedside.  All questions and concerns have been addressed.  They verbalized understanding and agree with the plan.  CODE STATUS was discussed and patient is a DNR  Severity of Illness: The appropriate patient status for this patient is OBSERVATION. Observation status is judged to be reasonable and necessary in order to provide the required intensity of service to ensure the  patient's safety. The patient's presenting symptoms, physical exam findings, and initial radiographic and laboratory data in the context of their medical condition is felt to place them at decreased risk for further clinical deterioration. Furthermore, it is anticipated that the patient will be medically stable for discharge from the hospital within 2 midnights of admission.   Author: Collier Bullock, MD 01/07/2022 4:10 PM  For on call review [email or web address].

## 2022-01-07 NOTE — Assessment & Plan Note (Addendum)
Patient noted to be very anxious and agitated. Only notes worsening mental status changes and patient has been on prednisone for bronchitis She will need a sitter and her daughter will stay with her during this hospitalization Discontinue prednisone We will give a dose of Haldol for her agitation

## 2022-01-08 DIAGNOSIS — R531 Weakness: Secondary | ICD-10-CM

## 2022-01-08 DIAGNOSIS — K219 Gastro-esophageal reflux disease without esophagitis: Secondary | ICD-10-CM

## 2022-01-08 DIAGNOSIS — K449 Diaphragmatic hernia without obstruction or gangrene: Secondary | ICD-10-CM

## 2022-01-08 DIAGNOSIS — F03918 Unspecified dementia, unspecified severity, with other behavioral disturbance: Secondary | ICD-10-CM

## 2022-01-08 DIAGNOSIS — E039 Hypothyroidism, unspecified: Secondary | ICD-10-CM

## 2022-01-08 DIAGNOSIS — J9601 Acute respiratory failure with hypoxia: Secondary | ICD-10-CM | POA: Diagnosis not present

## 2022-01-08 LAB — BASIC METABOLIC PANEL
Anion gap: 8 (ref 5–15)
BUN: 9 mg/dL (ref 8–23)
CO2: 26 mmol/L (ref 22–32)
Calcium: 8.7 mg/dL — ABNORMAL LOW (ref 8.9–10.3)
Chloride: 105 mmol/L (ref 98–111)
Creatinine, Ser: 0.86 mg/dL (ref 0.44–1.00)
GFR, Estimated: >60 mL/min (ref >60)
Glucose, Bld: 90 mg/dL (ref 70–99)
Potassium: 3.7 mmol/L (ref 3.5–5.1)
Sodium: 139 mmol/L (ref 135–145)

## 2022-01-08 LAB — ECHOCARDIOGRAM COMPLETE
Area-P 1/2: 3.85 cm2
Height: 64 in
P 1/2 time: 498 msec
S' Lateral: 2.9 cm
Weight: 2349.22 oz

## 2022-01-08 LAB — CBC
HCT: 35.3 % — ABNORMAL LOW (ref 36.0–46.0)
Hemoglobin: 11.3 g/dL — ABNORMAL LOW (ref 12.0–15.0)
MCH: 29 pg (ref 26.0–34.0)
MCHC: 32 g/dL (ref 30.0–36.0)
MCV: 90.5 fL (ref 80.0–100.0)
Platelets: 339 10*3/uL (ref 150–400)
RBC: 3.9 MIL/uL (ref 3.87–5.11)
RDW: 13.4 % (ref 11.5–15.5)
WBC: 11.7 10*3/uL — ABNORMAL HIGH (ref 4.0–10.5)
nRBC: 0 % (ref 0.0–0.2)

## 2022-01-08 MED ORDER — PANTOPRAZOLE SODIUM 40 MG PO TBEC
40.0000 mg | DELAYED_RELEASE_TABLET | Freq: Two times a day (BID) | ORAL | Status: DC
  Administered 2022-01-08 – 2022-01-09 (×2): 40 mg via ORAL
  Filled 2022-01-08 (×2): qty 1

## 2022-01-08 MED ORDER — COLESTIPOL HCL 1 G PO TABS
2.0000 g | ORAL_TABLET | Freq: Two times a day (BID) | ORAL | Status: DC
  Administered 2022-01-08 – 2022-01-09 (×3): 2 g via ORAL
  Filled 2022-01-08 (×3): qty 2

## 2022-01-08 MED ORDER — AMOXICILLIN-POT CLAVULANATE 875-125 MG PO TABS
1.0000 | ORAL_TABLET | Freq: Two times a day (BID) | ORAL | Status: DC
  Administered 2022-01-08 – 2022-01-09 (×2): 1 via ORAL
  Filled 2022-01-08 (×2): qty 1

## 2022-01-08 MED ORDER — ALPRAZOLAM 0.25 MG PO TABS: 0.2500 mg | ORAL_TABLET | Freq: Two times a day (BID) | ORAL | Status: DC | PRN

## 2022-01-08 MED ORDER — PREDNISONE 10 MG PO TABS
10.0000 mg | ORAL_TABLET | Freq: Every day | ORAL | Status: DC
  Administered 2022-01-08 – 2022-01-09 (×2): 10 mg via ORAL
  Filled 2022-01-08 (×2): qty 1

## 2022-01-08 MED ORDER — BENZONATATE 100 MG PO CAPS
200.0000 mg | ORAL_CAPSULE | Freq: Three times a day (TID) | ORAL | Status: DC | PRN
  Administered 2022-01-08: 200 mg via ORAL
  Filled 2022-01-08: qty 2

## 2022-01-08 NOTE — Progress Notes (Signed)
Moved to Room 123 to be closer to nurses station due to dementia.

## 2022-01-08 NOTE — Evaluation (Signed)
Clinical/Bedside Swallow Evaluation Patient Details  Name: CADY HAFEN MRN: [id number] Date of Birth: 12/21/1932  Today's Date: 01/08/2022 Time: SLP Start Time (ACUTE ONLY): 1255 SLP Stop Time (ACUTE ONLY): 1330 SLP Time Calculation (min) (ACUTE ONLY): 35 min  Past Medical History:  Past Medical History:  Diagnosis Date   Actinic keratosis 09/11/2015   right nasal bridge   Anemia    followed by pcp and unconcerned.   Arthritis    Cancer ([place])    skin   Cough    CHRONIC   Dementia (HCC)    Depression    Diverticulosis    Dizziness    possibly cardiac related   GERD (gastroesophageal reflux disease)    H/O hiatal hernia    Hx of basal cell carcinoma 07/20/2012   L posterior neck   Hx of basal cell carcinoma 12/07/2016   R proximal medial calf   Hypothyroidism    PONV (postoperative nausea and vomiting)    Shortness of breath    COUGH , magnesium helps this   Squamous cell carcinoma of skin 08/16/2008   R lower leg, excised 10/10/2008   Wheezing    Past Surgical History:  Past Surgical History:  Procedure Laterality Date   CARDIOVASCULAR STRESS TEST     2012   [place] EXTRACTION W/PHACO Right 02/26/2015   Procedure: CATARACT EXTRACTION PHACO AND INTRAOCULAR LENS PLACEMENT ([place]);  Surgeon: Birder Robson, MD;  Location: [facility];  Service: Ophthalmology;  Laterality: Right;  [place]  02:00 AP% 27.4 CDE 32.96 fluid pack lot # [id number]   CATARACT EXTRACTION W/PHACO Left 03/19/2015   Procedure: CATARACT EXTRACTION PHACO AND INTRAOCULAR LENS PLACEMENT ([facility]);  Surgeon: Birder Robson, MD;  Location: [facility];  Service: Ophthalmology;  Laterality: Left;  [place] 02:07 AP% 26.0 CDE 33.14 fluid pack lot # [id number]   CATARACT EXTRACTION, BILATERAL     CHOLECYSTECTOMY     EYE SURGERY     hardware in right shoulder Right 2010   ball and pin in shoulder   HERNIA REPAIR     2012   JOINT REPLACEMENT     LUMBAR LAMINECTOMY/DECOMPRESSION  MICRODISCECTOMY  05/04/2011   Procedure: LUMBAR LAMINECTOMY/DECOMPRESSION MICRODISCECTOMY 1 LEVEL;  Surgeon: Ophelia Charter, MD;  Location: [place] [facility];  Service: Neurosurgery;  Laterality: N/A;  Lumbar three and lumbar four Laminectomy   NO PAST SURGERIES     RIGHT ARM SURGERY BALL + PIN    REVERSE SHOULDER ARTHROPLASTY Right 09/04/2016   Procedure: RIGHT REVERSE TOTAL SHOULDER ARTHROPLASTY REVISION;  Surgeon: Netta Cedars, MD;  Location: [place];  Service: Orthopedics;  Laterality: Right;   THYROID LOBECTOMY  2016    APPROX 4 YRS AGO    THYROIDECTOMY     TONSILLECTOMY     TOTAL SHOULDER ARTHROPLASTY     Right   HPI:  Per admitting H & P "GENELLE ECONOMOU is a 88 y.o. female with medical history significant for Alzheimer's dementia, hypothyroidism, history of DVT no longer on anticoagulation, GERD who presents to the ER via private vehicle for evaluation of a persistent cough that she has had for over a week.  Patient's husband notes that she has had a cough for over a week.  Cough is nonproductive and appears to be worse when she lays flat.  He is unable to tell me if she has associated shortness of breath because she does not walk a lot.  She has not had any sick contacts  and has not had any fever or chills.  She has no leg swelling.  She was seen at an urgent care clinic and prescribed prednisone but has not had any improvement.  Daughter is concerned that the cough is worse and that the patient appears more confused when compared to her baseline.  Husband also notes that she had a choking episode about a month ago on rice and he mostly feeds her semisolid's now.  She had an episode of emesis and diarrhea but that has resolved  I am unable to do review of systems on this patient that she is very tearful and anxious.  Per ER provider she had room air pulse oximetry of 88% and was placed on 2 L of oxygen with improvement in her pulse oximetry to 98%  She had a CT angiogram which showed no evidence  of pulmonary embolism or other acute intrathoracic abnormality. Cardiomegaly with coronary artery atherosclerotic calcifications.  Pulmonary trunk is dilated concerning for pulmonary arterial hypertension.  Moderate size hiatal hernia. Marked thoracic kyphoscoliosis.  She will be referred to observation status for further evaluation     "    Assessment / Plan / Recommendation  Clinical Impression  Pt presents with mild dysphagia at bedside with no overt s/s of aspiration. Pt does have a hoarse cough but did not seem to be directly related to any PO's. Oral mech exam revealed structures to be functioning adequately. Pt tolerated pureed and liquids well. Noted oral transit delay with dry solids. Pts husband cares for her at home with the assistance of a trained caregiver who was present. Husband reports occasional difficulty with items such as rice. He modifies diet appropraitely at home to ensure sallowing safety. Pt is currently on a full liquid diet here at the hospital. Rec diet upgrade to Dysphagia 2 with chopped foods for ease of chewing for now. Will follow up with toleration. If Pt prefers, diet can likly be upgraded to Dys 3 with chopped meats. ST to follow. Prognosis good. Family is very supportive. SLP Visit Diagnosis: Dysphagia, oropharyngeal phase (R13.12)    Aspiration Risk  Mild aspiration risk    Diet Recommendation Dysphagia 2 (Fine chop)   Liquid Administration via: Cup;Straw Medication Administration: Whole meds with puree Compensations: Minimize environmental distractions;Slow rate;Small sips/bites Postural Changes: Seated upright at 90 degrees;Remain upright for at least 30 minutes after po intake    Other  Recommendations Oral Care Recommendations: Oral care BID    Recommendations for follow up therapy are one component of a multi-disciplinary discharge planning process, led by the attending physician.  Recommendations may be updated based on patient status, additional  functional criteria and insurance authorization.  Follow up Recommendations No SLP follow up      Assistance Recommended at Discharge  No ST needed.  Functional Status Assessment Patient has had a recent decline in their functional status and demonstrates the ability to make significant improvements in function in a reasonable and predictable amount of time.  Frequency and Duration min 2x/week  1 week       Prognosis Prognosis for Safe Diet Advancement: Guarded      Swallow Study   General Date of Onset: 01/07/22 HPI: Per admitting H & P "TOVAH SLAVICK is a 88 y.o. female with medical history significant for Alzheimer's dementia, hypothyroidism, history of DVT no longer on anticoagulation, GERD who presents to the ER via private vehicle for evaluation of a persistent cough that she has had for over a week.  Patient's husband notes that she has had a cough for over a week.  Cough is nonproductive and appears to be worse when she lays flat.  He is unable to tell me if she has associated shortness of breath because she does not walk a lot.  She has not had any sick contacts and has not had any fever or chills.  She has no leg swelling.  She was seen at an urgent care clinic and prescribed prednisone but has not had any improvement.  Daughter is concerned that the cough is worse and that the patient appears more confused when compared to her baseline.  Husband also notes that she had a choking episode about a month ago on rice and he mostly feeds her semisolid's now.  She had an episode of emesis and diarrhea but that has resolved  I am unable to do review of systems on this patient that she is very tearful and anxious.  Per ER provider she had room air pulse oximetry of 88% and was placed on 2 L of oxygen with improvement in her pulse oximetry to 98%  She had a CT angiogram which showed no evidence of pulmonary embolism or other acute intrathoracic abnormality. Cardiomegaly with coronary artery  atherosclerotic calcifications.  Pulmonary trunk is dilated concerning for pulmonary arterial hypertension.  Moderate size hiatal hernia. Marked thoracic kyphoscoliosis.  She will be referred to observation status for further evaluation     " Type of Study: Bedside Swallow Evaluation Diet Prior to this Study:  (full liquids) Temperature Spikes Noted: No Respiratory Status: Room air History of Recent Intubation: No Behavior/Cognition: Alert;Cooperative;Pleasant mood Oral Cavity Assessment: Within Functional Limits Oral Care Completed by SLP: No Oral Cavity - Dentition: Adequate natural dentition Vision: Functional for self-feeding Self-Feeding Abilities: Able to feed self Patient Positioning: Upright in chair Baseline Vocal Quality: Normal Volitional Cough: Strong    Oral/Motor/Sensory Function Overall Oral Motor/Sensory Function: Within functional limits   Ice Chips Ice chips: Within functional limits Presentation: Cup   Thin Liquid Thin Liquid: Within functional limits Presentation: Cup;Straw    Nectar Thick Nectar Thick Liquid: Not tested   Honey Thick Honey Thick Liquid: Not tested   Puree Puree: Within functional limits Presentation: Self Fed   Solid     Solid: Impaired Presentation: Self Fed Oral Phase Functional Implications: Prolonged oral transit      Lucila Maine 01/08/2022,1:49 PM

## 2022-01-08 NOTE — Plan of Care (Signed)

## 2022-01-08 NOTE — Evaluation (Signed)
Occupational Therapy Evaluation Patient Details Name: Brenda Goodman MRN: [id number] DOB: Feb 17, 1933 Today's Date: 01/08/2022   History of Present Illness Pt is an 88  year old female admitted with acute hypoxic respiratory failure; PMH significant for Alzheimer's dementia, hypothyroidism, history of DVT no longer on anticoagulation, GERD   Clinical Impression   Chart reviewed, pt greeted in room with husband and caregiver present, agreeable to OT evaluation. Pt is oriented to self and place only, family reports pt cognitive status is altered than typical baseline however does present with cognitive deficits at baseline. PTA pt amb with SPC short household distances, has assist for bathing tasks, supervision as needed for dressing/grooming however generally able to complete with MOD I. Pt presents with deficits in strength, endurance, activity tolerance, cognition, balance, affecting safe and optimal ADL completion. Bed mobility completed with MIN-MOD A, STS with MIN A, amb approx 10' in room with RW with MIN A. Pt reports feeling drunk/dizzy no different than baseline for the last 5 years (husband reports ongoing issues). LB dressing completed with MAX A. Multi modal cueing required throughout for attention/participation in task. Education provided to pt and family re: safe DME use, safe ADL completion, discharge recommendations, assist level at home. Pt is left as received, all needs met. OT will continue to follow acutely.     Recommendations for follow up therapy are one component of a multi-disciplinary discharge planning process, led by the attending physician.  Recommendations may be updated based on patient status, additional functional criteria and insurance authorization.   Follow Up Recommendations  Home health OT (if pt does not have 24/7 physical assist available, would recommend considering STR however familyreports they can provide recommended level of assist)     Assistance  Recommended at Discharge Frequent or constant Supervision/Assistance  Patient can return home with the following A lot of help with walking and/or transfers;A lot of help with bathing/dressing/bathroom;Direct supervision/assist for medications management;Assistance with cooking/housework    Functional Status Assessment  Patient has had a recent decline in their functional status and demonstrates the ability to make significant improvements in function in a reasonable and predictable amount of time.  Equipment Recommendations  BSC/3in1;Wheelchair (measurements OT);Hospital bed, 2WW   Recommendations for Other Services       Precautions / Restrictions Precautions Precautions: Fall Precaution Comments: chronic dizziness Restrictions Weight Bearing Restrictions: No      Mobility Bed Mobility Overal bed mobility: Needs Assistance Bed Mobility: Supine to Sit, Sit to Supine     Supine to sit: Min assist, HOB elevated Sit to supine: Mod assist, HOB elevated   General bed mobility comments: step by step vcs    Transfers Overall transfer level: Needs assistance Equipment used: Rolling walker (2 wheels) Transfers: Sit to/from Stand Sit to Stand: Min assist                  Balance Overall balance assessment: Needs assistance Sitting-balance support: Feet supported Sitting balance-Leahy Scale: Good     Standing balance support: Bilateral upper extremity supported, During functional activity, Reliant on assistive device for balance Standing balance-Leahy Scale: Poor                             ADL either performed or assessed with clinical judgement   ADL Overall ADL's : Needs assistance/impaired     Grooming: Sitting;Supervision/safety;Cueing for sequencing  Toilet Transfer: Minimal assistance;Rolling walker (2 wheels);Ambulation;Cueing for safety;Cueing for sequencing Toilet Transfer Details (indicate cue type and reason):  simulated         Functional mobility during ADLs: Minimal assistance;Rolling walker (2 wheels);Cueing for safety;Cueing for sequencing (approx 10' 2 attempts in room)       Vision Baseline Vision/History: 1 Wears glasses Patient Visual Report: No change from baseline       Perception     Praxis      Pertinent Vitals/Pain Pain Assessment Pain Assessment: No/denies pain     Hand Dominance     Extremity/Trunk Assessment Upper Extremity Assessment Upper Extremity Assessment: Generalized weakness   Lower Extremity Assessment Lower Extremity Assessment: Generalized weakness       Communication Communication Communication: No difficulties   Cognition Arousal/Alertness: Awake/alert Behavior During Therapy: Flat affect Overall Cognitive Status: Impaired/Different from baseline Area of Impairment: Orientation, Attention, Memory, Following commands, Safety/judgement, Awareness, Problem solving                 Orientation Level: Disoriented to, Time, Situation Current Attention Level: Sustained Memory: Decreased short-term memory Following Commands: Follows one step commands with increased time Safety/Judgement: Decreased awareness of deficits, Decreased awareness of safety Awareness: Intellectual Problem Solving: Slow processing, Decreased initiation, Difficulty sequencing, Requires verbal cues, Requires tactile cues General Comments: Pt has baseline cognitive deficits, family reports she is more confused/disoriented than baseline     General Comments       Exercises Other Exercises Other Exercises: edu pt, family, caregiver er: role of OT, role of rehab, discharge recommendations, DME use at home, falls prevention Other Exercises: Problem solved getting in and out of bed- pt bed is very high, recommended hospital bed and or/bed rails with physical assist   Shoulder Instructions      Home Living Family/patient expects to be discharged to:: Private  residence Living Arrangements: Spouse/significant other;Children Available Help at Discharge: Family;Available 24 hours/day Type of Home: House Home Access: Stairs to enter [employer] of Steps: 4 Entrance Stairs-Rails: Right Home Layout: One level     Bathroom Shower/Tub: Tub/shower unit;Sponge bathes at baseline   Bathroom Toilet: Handicapped height Bathroom Accessibility: No   Home Equipment: Cane - single point;Tub bench          Prior Functioning/Environment Prior Level of Function : Needs assist             Mobility Comments: amb with SPC short household distances ADLs Comments: SET UP for dressing/grooming tasks; assist for bathing, IADLs due to cogntive status per family/caregiver report        OT Problem List: Decreased strength;Decreased activity tolerance;Impaired balance (sitting and/or standing);Decreased safety awareness;Decreased cognition;Decreased knowledge of use of DME or AE      OT Treatment/Interventions: Self-care/ADL training;Patient/family education;Therapeutic exercise;Balance training;Energy conservation;Therapeutic activities;DME and/or AE instruction    OT Goals(Current goals can be found in the care plan section) Acute Rehab OT Goals Patient Stated Goal: go h ome OT Goal Formulation: With patient/family Time For Goal Achievement: 01/22/22 Potential to Achieve Goals: Good ADL Goals Pt Will Perform Grooming: with supervision;sitting Pt Will Transfer to Toilet: with supervision Pt Will Perform Toileting - Clothing Manipulation and hygiene: with supervision  OT Frequency: Min 2X/week    Co-evaluation              AM-PAC OT "6 Clicks" Daily Activity     Outcome Measure Help from another person eating meals?: None Help from another person taking care of personal grooming?: A Little Help  from another person toileting, which includes using toliet, bedpan, or urinal?: A Lot Help from another person bathing (including  washing, rinsing, drying)?: A Lot Help from another person to put on and taking off regular upper body clothing?: A Little Help from another person to put on and taking off regular lower body clothing?: A Lot 6 Click Score: 16   End of Session Equipment Utilized During Treatment: Rolling walker (2 wheels) Nurse Communication: Mobility status  Activity Tolerance: Patient tolerated treatment well Patient left: in bed;with call bell/phone within reach;with bed alarm set;with family/visitor present  OT Visit Diagnosis: Unsteadiness on feet (R26.81);Muscle weakness (generalized) (M62.81)                Time: 2035-5974 OT Time Calculation (min): 26 min Charges:  OT General Charges $OT Visit: 1 Visit OT Evaluation $OT Eval Moderate Complexity: 1 Mod  Shanon Payor, OTD OTR/L  01/08/22, 11:46 AM

## 2022-01-08 NOTE — Progress Notes (Signed)
Patient developed mild worsening SOB post episode of coughing after lunch. Respiratory therapy called to administer breathing treatment . Flutter valve given to patient and instructed with help of RT on use.

## 2022-01-08 NOTE — Progress Notes (Signed)
[facility]                                                                               Brenda Goodman, is a 88 y.o. female, DOB - [birth year], GYI:[id number] Admit date - 01/07/2022    Outpatient Primary MD for the patient is Kirk Ruths, MD  LOS - 0  days    Brief summary    Brenda Goodman is a 88 y.o. female with medical history significant for Alzheimer's dementia, hypothyroidism, history of DVT no longer on anticoagulation, GERD who presents to the ER via private vehicle for evaluation of a persistent cough that she has had for over a week. On detailed  exam and talk with the family. They report that she feels better  when taking steroids and after the steroids are done, she would start coughing.     Assessment & Plan    Assessment and Plan: * Acute hypoxic respiratory failure (HCC) Unclear etiology suspect silent aspiration due to GERD/ Hiatal hernia.  Patient was said to have room air pulse oximetry of 88% Currently on 2 L of oxygen with pulse oximetry of 98% No known history of COPD Concern for possible silent aspiration due to her known history of hiatal hernia/GERD and worsening dementia Stop the doxycycline and start the patient on augmentin  Speech therapy consult for swallow function evaluation 2D echocardiogram is unremarkable.  As needed bronchodilator therapy Attempt to wean off oxygen as tolerated She would benefit from outpatient pulm referral for PFT's and evaluation of ILD/ hyperreactive airways.    Depression Continue Lexapro  Dementia ([place]) With behavioral abnormalities.  Pt on xanax at home.  Restart at a lower dose.    Hiatal hernia with gastroesophageal reflux Patient has a history of hiatal hernia with GERD Continue omeprazole and Carafate  Hypothyroidism Continue Synthroid    Mild leukocytosis On steroids.   Estimated body mass index is 25.2 kg/m as calculated from the following:   Height as of this  encounter: '5\' 4"'$  (1.626 m).   Weight as of this encounter: 66.6 kg.  Code Status: DNR DVT Prophylaxis:  enoxaparin (LOVENOX) injection 40 mg Start: 01/07/22 2200   Level of Care: Level of care: Telemetry Medical Family Communication: FAMILY AT BEDSIDE.   Disposition Plan:     Remains inpatient appropriate:    Procedures:  None.   Consultants:   None.   Antimicrobials:   Anti-infectives (From admission, onward)    Start     Dose/Rate Route Frequency Ordered Stop   01/07/22 2200  doxycycline (VIBRA-TABS) tablet 100 mg        100 mg Oral 2 times daily 01/07/22 1559          Medications  Scheduled Meds:  cholecalciferol  2,000 Units Oral Daily   colestipol  2 g Oral BID   doxycycline  100 mg Oral BID   enoxaparin (LOVENOX) injection  40 mg Subcutaneous Q24H   escitalopram  20 mg Oral Daily   ferrous sulfate  325 mg Oral Q lunch   levothyroxine  125 mcg Oral QAC breakfast   pantoprazole  40 mg Oral BID   predniSONE  10 mg  Oral Q breakfast   sodium chloride flush  3 mL Intravenous Q12H   sucralfate  1 g Oral TID WC & HS   Continuous Infusions:  sodium chloride     PRN Meds:.sodium chloride, acetaminophen, guaiFENesin-dextromethorphan, ipratropium-albuterol, ondansetron **OR** ondansetron (ZOFRAN) IV, sodium chloride flush    Subjective:   Brenda Goodman was seen and examined today.  Reports feeling better.   Objective:   Vitals:   01/08/22 0049 01/08/22 0425 01/08/22 0943 01/08/22 1150  BP: 100/61 114/61 123/71 123/73  Pulse: (!) 58 71 86 79  Resp: '20 20 16 16  '$ Temp: 98 F (36.7 C) 98.4 F (36.9 C) 98.9 F (37.2 C) 98.2 F (36.8 C)  TempSrc:      SpO2: 96% 94% 93% 93%  Weight:      Height:        Intake/Output Summary (Last 24 hours) at 01/08/2022 1244 Last data filed at 01/07/2022 1537 Gross per 24 hour  Intake 500 ml  Output --  Net 500 ml   Filed Weights   01/07/22 1048  Weight: 66.6 kg     Exam General exam: Appears calm and  comfortable  Respiratory system: Clear to auscultation. Respiratory effort normal. Cardiovascular system: S1 & S2 heard, RRR. No JVD, Gastrointestinal system: Abdomen is nondistended, soft and nontender.  Central nervous system: Alert and oriented. To person only.  Extremities: Symmetric 5 x 5 power. Skin: No rashes, lesions or ulcers Psychiatry: . Mood & affect appropriate.     Data Reviewed:  I have personally reviewed following labs and imaging studies   CBC Lab Results  Component Value Date   WBC 11.7 (H) 01/08/2022   RBC 3.90 01/08/2022   HGB 11.3 (L) 01/08/2022   HCT 35.3 (L) 01/08/2022   MCV 90.5 01/08/2022   MCH 29.0 01/08/2022   PLT 339 01/08/2022   MCHC 32.0 01/08/2022   RDW 13.4 01/08/2022   LYMPHSABS 1.4 01/07/2022   MONOABS 0.8 01/07/2022   EOSABS 0.2 01/07/2022   BASOSABS 0.1 73/41/9379     Last metabolic panel Lab Results  Component Value Date   NA 139 01/08/2022   K 3.7 01/08/2022   CL 105 01/08/2022   CO2 26 01/08/2022   BUN 9 01/08/2022   CREATININE 0.86 01/08/2022   GLUCOSE 90 01/08/2022   GFRNONAA >60 01/08/2022   GFRAA >60 09/05/2016   CALCIUM 8.7 (L) 01/08/2022   PROT 6.9 01/07/2022   ALBUMIN 3.3 (L) 01/07/2022   BILITOT 0.8 01/07/2022   ALKPHOS 81 01/07/2022   AST 25 01/07/2022   ALT 19 01/07/2022   ANIONGAP 8 01/08/2022    CBG (last 3)  No results for input(s): "GLUCAP" in the last 72 hours.    Coagulation Profile: No results for input(s): "INR", "PROTIME" in the last 168 hours.   Radiology Studies: ECHOCARDIOGRAM COMPLETE  Result Date: 01/08/2022    ECHOCARDIOGRAM REPORT   Patient Name:   Brenda Goodman Date of Exam: 01/07/2022 Medical Rec #:  [id number]        Height:       64.0 in Accession #:    [id number]       Weight:       146.8 lb Date of Birth:  01-06-1933        BSA:          1.716 m Patient Age:    88 years         BP:  126/75 mmHg Patient Gender: F                HR:           68 bpm. Exam Location:  [facility]  Procedure: 2D Echo, Cardiac Doppler and Color Doppler Indications:     I27.2 Pulmonary hypertension  History:         Patient has prior history of Echocardiogram examinations, most                  recent 08/31/2007. Signs/Symptoms:Shortness of Breath.  Sonographer:     Cresenciano Lick RDCS Referring Phys:  [id number] AGBATA Diagnosing Phys: Isaias Cowman MD IMPRESSIONS  1. Left ventricular ejection fraction, by estimation, is 60 to 65%. The left ventricle has normal function. The left ventricle has no regional wall motion abnormalities. Left ventricular diastolic parameters were normal.  2. Right ventricular systolic function is normal. The right ventricular size is normal.  3. The mitral valve is normal in structure. Mild mitral valve regurgitation. No evidence of mitral stenosis.  4. The aortic valve is normal in structure. Aortic valve regurgitation is mild. No aortic stenosis is present.  5. The inferior vena cava is normal in size with greater than 50% respiratory variability, suggesting right atrial pressure of 3 mmHg. FINDINGS  Left Ventricle: Left ventricular ejection fraction, by estimation, is 60 to 65%. The left ventricle has normal function. The left ventricle has no regional wall motion abnormalities. The left ventricular internal cavity size was normal in size. There is  no left ventricular hypertrophy. Left ventricular diastolic parameters were normal. Right Ventricle: The right ventricular size is normal. No increase in right ventricular wall thickness. Right ventricular systolic function is normal. Left Atrium: Left atrial size was normal in size. Right Atrium: Right atrial size was normal in size. Pericardium: There is no evidence of pericardial effusion. Mitral Valve: The mitral valve is normal in structure. Mild mitral valve regurgitation. No evidence of mitral valve stenosis. Tricuspid Valve: The tricuspid valve is normal in structure. Tricuspid valve regurgitation is mild . No  evidence of tricuspid stenosis. Aortic Valve: The aortic valve is normal in structure. Aortic valve regurgitation is mild. Aortic regurgitation PHT measures 498 msec. No aortic stenosis is present. Pulmonic Valve: The pulmonic valve was normal in structure. Pulmonic valve regurgitation is not visualized. No evidence of pulmonic stenosis. Aorta: The aortic root is normal in size and structure. Venous: The inferior vena cava is normal in size with greater than 50% respiratory variability, suggesting right atrial pressure of 3 mmHg. IAS/Shunts: No atrial level shunt detected by color flow Doppler.  LEFT VENTRICLE PLAX 2D LVIDd:         4.50 cm   Diastology LVIDs:         2.90 cm   LV e' medial:    8.43 cm/s LV PW:         0.50 cm   LV E/e' medial:  6.1 LV IVS:        0.50 cm   LV e' lateral:   10.80 cm/s LVOT diam:     1.90 cm   LV E/e' lateral: 4.8 LV SV:         60 LV SV Index:   35 LVOT Area:     2.84 cm  RIGHT VENTRICLE             IVC RV Basal diam:  2.80 cm     IVC diam: 1.70 cm RV S prime:  10.79 cm/s TAPSE (M-mode): 1.3 cm LEFT ATRIUM             Index        RIGHT ATRIUM          Index LA diam:        3.60 cm 2.10 cm/m   RA Area:     9.73 cm LA Vol (A2C):   28.5 ml 16.61 ml/m  RA Volume:   19.70 ml 11.48 ml/m LA Vol (A4C):   50.7 ml 29.55 ml/m LA Biplane Vol: 38.7 ml 22.56 ml/m  AORTIC VALVE LVOT Vmax:   106.67 cm/s LVOT Vmean:  67.833 cm/s LVOT VTI:    0.211 m AI PHT:      498 msec  AORTA Ao Root diam: 2.70 cm MITRAL VALVE               TRICUSPID VALVE MV Area (PHT): 3.85 cm    TR Peak grad:   18.0 mmHg MV Decel Time: 197 msec    TR Vmax:        212.00 cm/s MV E velocity: 51.70 cm/s MV A velocity: 80.50 cm/s  SHUNTS MV E/A ratio:  0.64        Systemic VTI:  0.21 m                            Systemic Diam: 1.90 cm Isaias Cowman MD Electronically signed by Isaias Cowman MD Signature Date/Time: 01/08/2022/12:28:03 PM    Final    CT Angio Chest PE W/Cm &/Or Wo Cm  Result Date:  01/07/2022 CLINICAL DATA:  Pulmonary embolism suspected. EXAM: CT ANGIOGRAPHY CHEST WITH CONTRAST TECHNIQUE: Multidetector CT imaging of the chest was performed using the standard protocol during bolus administration of intravenous contrast. Multiplanar CT image reconstructions and MIPs were obtained to evaluate the vascular anatomy. RADIATION DOSE REDUCTION: This exam was performed according to the departmental dose-optimization program which includes automated exposure control, adjustment of the mA and/or kV according to patient size and/or use of iterative reconstruction technique. CONTRAST:  72m OMNIPAQUE IOHEXOL 350 MG/ML SOLN COMPARISON:  Medial patella sequela second FINDINGS: Cardiovascular: Satisfactory opacification of the pulmonary arteries to the segmental level. No evidence of pulmonary embolism. The heart is enlarged. Coronary artery atherosclerotic calcifications. Main pulmonary trunk is dilated measuring up to 3.2 cm concerning for pulmonary arterial hypertension. No pericardial effusion. Mediastinum/Nodes: No enlarged mediastinal, hilar, or axillary lymph nodes. Thyroid gland, trachea, and esophagus demonstrate no significant findings. Moderate size hiatal hernia. Lungs/Pleura: Biapical pleural/parenchymal scarring. No evidence of pneumonia or pulmonary edema. Bibasilar dependent atelectasis. Upper Abdomen: No acute abnormality. Musculoskeletal: Marked thoracic kyphoscoliosis.  No acute fracture. Review of the MIP images confirms the above findings. IMPRESSION: 1. No evidence of pulmonary embolism or other acute intrathoracic abnormality. 2. Cardiomegaly with coronary artery atherosclerotic calcifications. 3. Pulmonary trunk is dilated concerning for pulmonary arterial hypertension. 4. Moderate size hiatal hernia. 5. Marked thoracic kyphoscoliosis. Electronically Signed   By: IKeane PoliceD.O.   On: 01/07/2022 14:26   DG Chest Port 1 View  Result Date: 01/07/2022 CLINICAL DATA:  Possible  sepsis.  Cough for weeks. EXAM: PORTABLE CHEST 1 VIEW COMPARISON:  12/19/2021 FINDINGS: Right shoulder arthroplasty. Remote right upper rib fracture. Patient rotated to the left. Mild cardiomegaly with tortuous descending thoracic aorta. Atherosclerosis in the transverse aorta. No pleural effusion or pneumothorax. Diffuse interstitial thickening is nonspecific, especially in this age group. No lobar consolidation. Suspect pleuroparenchymal scarring  in both apices, accentuated by obliquity and the chin overlying the left-greater-than-right apices. IMPRESSION: No evidence of pneumonia. Cardiomegaly Aortic Atherosclerosis (ICD10-I70.0). Electronically Signed   By: Abigail Miyamoto M.D.   On: 01/07/2022 11:32       Hosie Poisson M.D. [facility] 01/08/2022, 12:44 PM  Available via Epic secure chat 7am-7pm After 7 pm, please refer to night coverage provider listed on amion.

## 2022-01-08 NOTE — Evaluation (Signed)
Physical Therapy Evaluation Patient Details Name: Brenda Goodman MRN: [id number] DOB: [birth year] Today's Date: 01/08/2022  History of Present Illness  Pt is an 88  year old female admitted with acute hypoxic respiratory failure; PMH significant for Alzheimer's dementia, hypothyroidism, history of DVT no longer on anticoagulation, GERD  Clinical Impression  Pt pleasantly confused t/o the session.  She did need some extra cuing and reinforcement to initiate movement toward EOB and did need some assist, though when able to initiate did seem to do much of the effort.  Similarly it took some cuing to encourage standing, ultimately needing some assist to rise and keep weight forward, again appears more cognition than weakness related limitations. Once stabilized using the walker she was able to do so but steady bout of ambulation >100 with with shuffling, walker reliance, but safe gait.  Family/caregiver present and report that she is moving slower than her baseline but is not too far off.  Recommending to continue PT here and at home to address mobility, safety and other functional defecits.     Recommendations for follow up therapy are one component of a multi-disciplinary discharge planning process, led by the attending physician.  Recommendations may be updated based on patient status, additional functional criteria and insurance authorization.  Follow Up Recommendations Home health PT      Assistance Recommended at Discharge Frequent or constant Supervision/Assistance  Patient can return home with the following  A little help with walking and/or transfers;A little help with bathing/dressing/bathroom;Assistance with cooking/housework;Assist for transportation;Help with stairs or ramp for entrance    Equipment Recommendations None recommended by PT  Recommendations for Other Services       Functional Status Assessment Patient has had a recent decline in their functional status and  demonstrates the ability to make significant improvements in function in a reasonable and predictable amount of time.     Precautions / Restrictions Precautions Precautions: Fall Precaution Comments: chronic dizziness Restrictions Weight Bearing Restrictions: No      Mobility  Bed Mobility Overal bed mobility: Modified Independent Bed Mobility: Supine to Sit     Supine to sit: Min assist, HOB elevated     General bed mobility comments: Pt showed some effort, did not need heavy assist but did need heavy cuing and encouragement.    Transfers Overall transfer level: Needs assistance Equipment used: Rolling walker (2 wheels) Transfers: Sit to/from Stand Sit to Stand: Min assist           General transfer comment: Pt again needed more cuing/encouragement than actual physical assist.  Initially leaning backward and needing direct assist to keep weight forward in the walker    Ambulation/Gait Ambulation/Gait assistance: Min guard, Min assist Gait Distance (Feet): 100 Feet Assistive device: Rolling walker (2 wheels)         General Gait Details: Pt with slow and shuffling gait.  Caregiver/family reports slower than her normal but once she got going similar to baseline.  She showed good effort, O2 remained in the 90s, HR to increase to ~100 bpm.  Stairs            Wheelchair Mobility    Modified Rankin (Stroke Patients Only)       Balance Overall balance assessment: Needs assistance Sitting-balance support: Feet supported Sitting balance-Leahy Scale: Good     Standing balance support: Bilateral upper extremity supported, During functional activity, Reliant on assistive device for balance Standing balance-Leahy Scale: Poor Standing balance comment: initially with heavy retro lean, improved some  with cuing but ultimately needing direct assist to keep from loosing balance backward                             Pertinent Vitals/Pain Pain  Assessment Pain Assessment: No/denies pain    Home Living Family/patient expects to be discharged to:: Private residence Living Arrangements: Spouse/significant other;Children Available Help at Discharge: Family;Available 24 hours/day Type of Home: House Home Access: Stairs to enter Entrance Stairs-Rails: Right Entrance Stairs-Number of Steps: 4   Home Layout: One level Home Equipment: Cane - single point;Tub bench;Rolling Walker (2 wheels)      Prior Function Prior Level of Function : Needs assist  Cognitive Assist : Mobility (cognitive);ADLs (cognitive)           Mobility Comments: amb with SPC short household distances, more recently needing RW consistently ADLs Comments: SET UP for dressing/grooming tasks; assist for bathing, IADLs due to cogntive status per family/caregiver report     Hand Dominance        Extremity/Trunk Assessment   Upper Extremity Assessment Upper Extremity Assessment: Generalized weakness;Difficult to assess due to impaired cognition    Lower Extremity Assessment Lower Extremity Assessment: Generalized weakness;Difficult to assess due to impaired cognition       Communication   Communication: No difficulties  Cognition Arousal/Alertness: Awake/alert Behavior During Therapy: WFL for tasks assessed/performed Overall Cognitive Status: History of cognitive impairments - at baseline                                 General Comments: Pt has baseline cognitive deficits, family reports she is more confused/disoriented than baseline but improved from yesterday        General Comments General comments (skin integrity, edema, etc.): Pt pleasantly confused, willing to participate and once moving did relatively well    Exercises Other Exercises Other Exercises: edu pt, family, caregiver er: role of OT, role of rehab, discharge recommendations, DME use at home, falls prevention   Assessment/Plan    PT Assessment Patient needs  continued PT services  PT Problem List Decreased strength;Decreased range of motion;Decreased activity tolerance;Decreased balance;Decreased mobility;Decreased cognition;Decreased knowledge of use of DME;Decreased safety awareness;Cardiopulmonary status limiting activity       PT Treatment Interventions DME instruction;Gait training;Stair training;Functional mobility training;Therapeutic activities;Therapeutic exercise;Balance training;Neuromuscular re-education;Cognitive remediation;Patient/family education    PT Goals (Current goals can be found in the Care Plan section)  Acute Rehab PT Goals Patient Stated Goal: go home PT Goal Formulation: With family Time For Goal Achievement: 01/21/22 Potential to Achieve Goals: Good    Frequency Min 2X/week     Co-evaluation               AM-PAC PT "6 Clicks" Mobility  Outcome Measure Help needed turning from your back to your side while in a flat bed without using bedrails?: A Little Help needed moving from lying on your back to sitting on the side of a flat bed without using bedrails?: A Little Help needed moving to and from a bed to a chair (including a wheelchair)?: A Little Help needed standing up from a chair using your arms (e.g., wheelchair or bedside chair)?: A Little Help needed to walk in hospital room?: A Little Help needed climbing 3-5 steps with a railing? : A Lot 6 Click Score: 17    End of Session Equipment Utilized During Treatment: Gait belt Activity Tolerance: Patient  tolerated treatment well;Patient limited by fatigue Patient left: with chair alarm set;with call bell/phone within reach;with family/visitor present Nurse Communication: Mobility status PT Visit Diagnosis: Unsteadiness on feet (R26.81);Difficulty in walking, not elsewhere classified (R26.2);Muscle weakness (generalized) (M62.81)    Time: 6196-9409 PT Time Calculation (min) (ACUTE ONLY): 24 min   Charges:   PT Evaluation $PT Eval Low Complexity:  1 Low PT Treatments $Gait Training: 8-22 mins        Kreg Shropshire, DPT 01/08/2022, 12:27 PM

## 2022-01-09 DIAGNOSIS — R052 Subacute cough: Secondary | ICD-10-CM

## 2022-01-09 DIAGNOSIS — J9601 Acute respiratory failure with hypoxia: Secondary | ICD-10-CM | POA: Diagnosis not present

## 2022-01-09 DIAGNOSIS — K449 Diaphragmatic hernia without obstruction or gangrene: Secondary | ICD-10-CM | POA: Diagnosis not present

## 2022-01-09 DIAGNOSIS — F03918 Unspecified dementia, unspecified severity, with other behavioral disturbance: Secondary | ICD-10-CM | POA: Diagnosis not present

## 2022-01-09 MED ORDER — IPRATROPIUM-ALBUTEROL 0.5-2.5 (3) MG/3ML IN SOLN: 3.0000 mL | Freq: Four times a day (QID) | RESPIRATORY_TRACT | 3 refills | Status: DC | PRN

## 2022-01-09 MED ORDER — AMOXICILLIN-POT CLAVULANATE 875-125 MG PO TABS: 1.0000 | ORAL_TABLET | Freq: Two times a day (BID) | ORAL | 0 refills | Status: AC

## 2022-01-09 MED ORDER — PANTOPRAZOLE SODIUM 40 MG PO TBEC: 40.0000 mg | DELAYED_RELEASE_TABLET | Freq: Two times a day (BID) | ORAL | 2 refills | Status: DC

## 2022-01-09 MED ORDER — GUAIFENESIN-DM 100-10 MG/5ML PO SYRP: 5.0000 mL | ORAL_SOLUTION | ORAL | 0 refills | Status: DC | PRN

## 2022-01-09 MED ORDER — BENZONATATE 200 MG PO CAPS: 200.0000 mg | ORAL_CAPSULE | Freq: Three times a day (TID) | ORAL | 0 refills | Status: DC | PRN

## 2022-01-09 NOTE — Plan of Care (Signed)
  Problem: Clinical Measurements: Goal: Ability to maintain clinical measurements within normal limits will improve Outcome: Progressing   Problem: Activity: Goal: Risk for activity intolerance will decrease Outcome: Progressing   Problem: Coping: Goal: Level of anxiety will decrease Outcome: Progressing   

## 2022-01-09 NOTE — Progress Notes (Cosign Needed)
Patient is not able to walk the distance required to go the bathroom, or he/she is unable to safely negotiate stairs required to access the bathroom.  A BSC will alleviate this problem. 

## 2022-01-09 NOTE — TOC Initial Note (Addendum)
Transition of Care [facility]) - Initial/Assessment Note    Patient Details  Name: Brenda Goodman MRN: [id number] Date of Birth: [birth year]  Transition of Care [facility]) CM/SW Contact:    Magnus Ivan, LCSW Phone Number: 01/09/2022, 9:37 AM  Clinical Narrative:                 CSW spoke with patient's caregiver Meg who was with patient's spouse. Patient lives at home with her spouse, Meg stays with them days and nights. Patient has 24 hour care at home.  PCP is Dr. Ouida Sills. Pharamacy is [employer]. Spouse provides transportation. Patient has a cane, tub bench, and walker at home.  Patient's spouse and caregiver are agreeable to HHPT/OT recommendations. Confirmed home address.  Referral made to [facility] with [facility]. He is aware of DC today.   9:55- DME orders in for 3in1 BSC and nebulizer.  Spoke to caregiver Meg again who confirms they would like this ordered. Referral made to [facility] with [employer] for 3in1 and nebulizer to be delivered to bedside prior to DC today.   10:05- Family requests RW as well as spouse uses the one they have at home. RW added to order through [employer], asked MD for orders.     Expected Discharge Plan: [place] Barriers to Discharge: Continued Medical Work up   Patient Goals and CMS Choice Patient states their goals for this hospitalization and ongoing recovery are:: home with home health CMS Medicare.gov Compare Post Acute Care list provided to:: Patient Represenative (must comment) Choice offered to / list presented to : Spouse  Expected Discharge Plan and Services Expected Discharge Plan: [place]       Living arrangements for the past 2 months: [place] Expected Discharge Date: 01/09/22                         HH Arranged: PT, OT [place] Agency: [place] ([place]) Date [place]: 01/09/22   Representative spoke with at [place]: Corene Cornea  Prior Living  Arrangements/Services Living arrangements for the past 2 months: [place] Lives with:: Spouse Patient language and need for interpreter reviewed:: Yes Do you feel safe going back to the place where you live?: Yes      Need for Family Participation in Patient Care: Yes (Comment) Care giver support system in place?: Yes (comment) Current home services: DME Criminal Activity/Legal Involvement Pertinent to Current Situation/Hospitalization: No - Comment as needed  Activities of Daily Living Home Assistive Devices/Equipment: Gilford Rile (specify type) ADL Screening (condition at time of admission) Patient's cognitive ability adequate to safely complete daily activities?: Yes Is the patient deaf or have difficulty hearing?: No Does the patient have difficulty seeing, even when wearing glasses/contacts?: No Does the patient have difficulty concentrating, remembering, or making decisions?: Yes Patient able to express need for assistance with ADLs?: Yes Does the patient have difficulty dressing or bathing?: Yes Independently performs ADLs?: No Does the patient have difficulty walking or climbing stairs?: Yes Weakness of Legs: Both Weakness of Arms/Hands: None  Permission Sought/Granted Permission sought to share information with : Facility Art therapist granted to share information with : Yes, Verbal Permission Granted     Permission granted to share info w AGENCY: [place] agencies        Emotional Assessment       Orientation: : Fluctuating Orientation (Suspected and/or reported Sundowners) Alcohol / Substance Use: Not  Applicable Psych Involvement: No (comment)  Admission diagnosis:  Hypoxemia [R09.02] Generalized weakness [R53.1] Subacute cough [R05.2] Acute hypoxic respiratory failure ([place]) [J96.01] Patient Active Problem List   Diagnosis Date Noted   Acute hypoxic respiratory failure ([place]) 01/07/2022   Hypothyroidism 01/07/2022   Hiatal hernia with  gastroesophageal reflux 01/07/2022   Dementia ([place]) 01/07/2022   Depression 01/07/2022   S/P shoulder replacement, right 09/04/2016   PCP:  Kirk Ruths, MD Pharmacy:   [place], [place] [address] Phone: [phone] Fax: [phone]  [facility] ([place], [place] [facility] [address] [place] [address] Phone: [phone] Fax: [phone]  [facility]) - [address], [place] [address] Phone: [phone] Fax: [place], [address] [place] [address] Phone: [phone] Fax: [phone]     Social Determinants of Health (SDOH) Interventions    Readmission Risk Interventions     No data to display

## 2022-01-09 NOTE — Progress Notes (Signed)
Occupational Therapy Treatment Patient Details Name: Brenda Goodman MRN: [id number] DOB: 1932-10-29 Today's Date: 01/09/2022   History of present illness Pt is an 88  year old female admitted with acute hypoxic respiratory failure; PMH significant for Alzheimer's dementia, hypothyroidism, history of DVT no longer on anticoagulation, GERD   OT comments  Chart reviewed, pt greeted in room with caregiver agreeable to OT tx session. Pt is pleasant, agreeable throughout with step by step vcs for participation. Mod-MAX A required for bathing, dressing tasks. Pt and caregiver educated re: DME recommendations for safe use at home. All Are in agreement. Pt left in care of PT, all needs met. Discharge recommendation remains appropriate.    Recommendations for follow up therapy are one component of a multi-disciplinary discharge planning process, led by the attending physician.  Recommendations may be updated based on patient status, additional functional criteria and insurance authorization.    Follow Up Recommendations  Home health OT     Assistance Recommended at Discharge Frequent or constant Supervision/Assistance  Patient can return home with the following  A lot of help with walking and/or transfers;A lot of help with bathing/dressing/bathroom;Direct supervision/assist for medications management;Assistance with cooking/housework   Equipment Recommendations  BSC/3in1;Wheelchair (measurements OT);Hospital bed;Other (comment) (2WW)    Recommendations for Other Services      Precautions / Restrictions Precautions Precautions: Fall Restrictions Weight Bearing Restrictions: No       Mobility Bed Mobility Overal bed mobility: Needs Assistance       Supine to sit: Min assist          Transfers Overall transfer level: Needs assistance Equipment used: Rolling walker (2 wheels) Transfers: Sit to/from Stand Sit to Stand: Min assist                 Balance Overall balance  assessment: Needs assistance Sitting-balance support: Feet supported Sitting balance-Leahy Scale: Good     Standing balance support: Bilateral upper extremity supported, During functional activity, Reliant on assistive device for balance Standing balance-Leahy Scale: Fair                             ADL either performed or assessed with clinical judgement   ADL Overall ADL's : Needs assistance/impaired                                       General ADL Comments: STS with MIN A, UB bathing with MOD A, LB bathing with MAX A, UB dressing with MOD A, LB dressing with MAX A; step by step vcs throughout for participation; amb in room approx 10' with RW with CGA    Extremity/Trunk Assessment              Vision       Perception     Praxis      Cognition Arousal/Alertness: Awake/alert Behavior During Therapy: WFL for tasks assessed/performed Overall Cognitive Status: History of cognitive impairments - at baseline                                          Exercises      Shoulder Instructions       General Comments educated pt and caregiver re: DME recommendations    Pertinent Vitals/ Pain  Pain Assessment Pain Assessment: No/denies pain  Home Living                                          Prior Functioning/Environment              Frequency  Min 2X/week        Progress Toward Goals  OT Goals(current goals can now be found in the care plan section)  Progress towards OT goals: Progressing toward goals     Plan Discharge plan remains appropriate    Co-evaluation                 AM-PAC OT "6 Clicks" Daily Activity     Outcome Measure   Help from another person eating meals?: None Help from another person taking care of personal grooming?: A Little Help from another person toileting, which includes using toliet, bedpan, or urinal?: A Lot Help from another person bathing  (including washing, rinsing, drying)?: A Lot Help from another person to put on and taking off regular upper body clothing?: A Little Help from another person to put on and taking off regular lower body clothing?: A Lot 6 Click Score: 16    End of Session Equipment Utilized During Treatment: Rolling walker (2 wheels)  OT Visit Diagnosis: Unsteadiness on feet (R26.81);Muscle weakness (generalized) (M62.81)   Activity Tolerance Patient tolerated treatment well   Patient Left  (in care of PT amb into hallway)   Nurse Communication Mobility status        Time: 8182-9937 OT Time Calculation (min): 12 min  Charges: OT Treatments $Self Care/Home Management : 8-22 mins  Shanon Payor, OTD OTR/L  01/09/22, 1:16 PM

## 2022-01-09 NOTE — Progress Notes (Signed)
Discharge instructions reviewed with patient, husband and caregiver including followup visits and new medications/medication changes.  Understanding was verbalized and all questions were answered.  IV removed without complication; patient tolerated well.  Patient discharged home via wheelchair in stable condition escorted by volunteer staff.  Home DME delivered to room prior to discharge.

## 2022-01-09 NOTE — Progress Notes (Addendum)
Physical Therapy Treatment Patient Details Name: Brenda Goodman MRN: [id number] DOB: 1932/12/22 Today's Date: 01/09/2022   History of Present Illness Pt is an 88  year old female admitted with acute hypoxic respiratory failure; PMH significant for Alzheimer's dementia, hypothyroidism, history of DVT no longer on anticoagulation, GERD    PT Comments    Pt received upright at RW with OT. OT handing pt off to PT. Caregiver present. Pt pleasant and cooperative, excited to d/c home today. Pt's caregiver reporting pt close to her baseline, feels confident pt will perform stairs safely to enter home with her assistance.  Pt completing 140' with slowed gait speed and RW. Intermittent minA on RW with turning and navigating obstacles due to pt's vision. Min to mod multimodal cuing keeping RW closer to BOS with limited carryover. Caregiver and pt educated on safety benefits keeping RW closer to prevent falls. Pt returning to recliner with multimodal cuing and education for safe stand to sit transfer with RW with good carryover post cuing. Pt and caregiver confident with pt d/c'ing home today. No DME from PT stand point with with communicating with caregiver and OT sounds like OT plans to rec [facility]. D/c recs remain appropriate.    Addendum 10:27 am: after discussion via secure chat, pt and spouse share RW. Based off of this information, PT to rec RW at d/c to reduce falls risk.    Recommendations for follow up therapy are one component of a multi-disciplinary discharge planning process, led by the attending physician.  Recommendations may be updated based on patient status, additional functional criteria and insurance authorization.  Follow Up Recommendations  Home health PT     Assistance Recommended at Discharge Frequent or constant Supervision/Assistance  Patient can return home with the following A little help with walking and/or transfers;A little help with bathing/dressing/bathroom;Assistance with  cooking/housework;Assist for transportation;Help with stairs or ramp for entrance   Equipment Recommendations  Rolling walker (2 wheels)    Recommendations for Other Services       Precautions / Restrictions Precautions Precautions: Fall Restrictions Weight Bearing Restrictions: No     Mobility  Bed Mobility               General bed mobility comments: NT. Standing with OT upon entry. Patient Response: Cooperative  Transfers Overall transfer level: Needs assistance Equipment used: Rolling walker (2 wheels)               General transfer comment: Stand to sit. Min to mod multimodal cuing for turning with RW and safe sitting.    Ambulation/Gait Ambulation/Gait assistance: Min guard, Min assist Gait Distance (Feet): 140 Feet           General Gait Details: Slowed shuffling gait with RW anterior to BOS. MinA on RW navigating obstacles in hallway due to vision. SPo2 >90%.   Stairs             Wheelchair Mobility    Modified Rankin (Stroke Patients Only)       Balance Overall balance assessment: Needs assistance Sitting-balance support: Feet supported Sitting balance-Leahy Scale: Good     Standing balance support: Bilateral upper extremity supported, During functional activity, Reliant on assistive device for balance Standing balance-Leahy Scale: Fair Standing balance comment: light UE support on RW                            Cognition Arousal/Alertness: Awake/alert Behavior During Therapy: [facility] for tasks assessed/performed  Overall Cognitive Status: History of cognitive impairments - at baseline                                          Exercises      General Comments        Pertinent Vitals/Pain Pain Assessment Pain Assessment: No/denies pain    Home Living                          Prior Function            PT Goals (current goals can now be found in the care plan section) Acute Rehab  PT Goals Patient Stated Goal: go home PT Goal Formulation: With family Time For Goal Achievement: 01/21/22 Potential to Achieve Goals: Good Progress towards PT goals: Progressing toward goals    Frequency    Min 2X/week      PT Plan Current plan remains appropriate    Co-evaluation              AM-PAC PT "6 Clicks" Mobility   Outcome Measure  Help needed turning from your back to your side while in a flat bed without using bedrails?: A Little Help needed moving from lying on your back to sitting on the side of a flat bed without using bedrails?: A Little Help needed moving to and from a bed to a chair (including a wheelchair)?: A Little Help needed standing up from a chair using your arms (e.g., wheelchair or bedside chair)?: A Little Help needed to walk in hospital room?: A Little Help needed climbing 3-5 steps with a railing? : A Lot 6 Click Score: 17    End of Session Equipment Utilized During Treatment: Gait belt Activity Tolerance: Patient tolerated treatment well Patient left: with chair alarm set;with call bell/phone within reach;with family/visitor present;in chair Nurse Communication: Mobility status PT Visit Diagnosis: Unsteadiness on feet (R26.81);Difficulty in walking, not elsewhere classified (R26.2);Muscle weakness (generalized) (M62.81)     Time: 0017-4944 PT Time Calculation (min) (ACUTE ONLY): 12 min  Charges:  $Therapeutic Exercise: 8-22 mins                    Salem Caster. Fairly IV, PT, DPT Physical Therapist- [facility]  01/09/2022, 10:28 AM

## 2022-01-09 NOTE — Discharge Summary (Signed)
Physician Discharge Summary   Patient: Brenda Goodman MRN: [id number] DOB: February 24, 1932  Admit date:     01/07/2022  Discharge date: 01/09/22  Discharge Physician: Hosie Poisson   PCP: Kirk Ruths, MD   Recommendations at discharge:  [place].  PLEASE FOLLOW UP WITH HOME HEALTH AS RECOMMENDED.    Discharge Diagnoses: Principal Problem:   Acute hypoxic respiratory failure (HCC) Active Problems:   Hypothyroidism   Hiatal hernia with gastroesophageal reflux   Dementia (HCC)   Depression  Resolved Problems:   * No resolved hospital problems. *  Hospital Course: Brenda Goodman is a 88 y.o. female with medical history significant for Alzheimer's dementia, hypothyroidism, history of DVT no longer on anticoagulation, GERD who presents to the ER via private vehicle for evaluation of a persistent cough that she has had for over a week. On detailed  exam and talk with the family. They report that she feels better  when taking steroids and after the steroids are done, she would start coughing.   Assessment and Plan:   Acute hypoxic respiratory failure ([facility]) Unclear etiology suspect silent aspiration due to GERD/ Hiatal hernia.  Patient was said to have room air pulse oximetry of 88%, She was started on 2 lit of [place] oxygen and weaned off.  No known history of COPD Concern for possible silent aspiration due to her known history of hiatal hernia/GERD and worsening dementia SLP eval recommended dysphagia 2 diet with thin liquid.  Stop the doxycycline and start the patient on augmentin , completed the course of augmentin for aspiration pneuomonitis.  2D echocardiogram is unremarkable.  As needed bronchodilator therapy She would benefit from outpatient pulm referral for PFT's and evaluation of ILD/ hyperreactive airways.      Depression Continue Lexapro   Dementia ([facility]) With behavioral abnormalities.  Pt on xanax at home.  Restart at a  lower dose.      Hiatal hernia with gastroesophageal reflux Patient has a history of hiatal hernia with GERD Continue omeprazole and Carafate   Hypothyroidism Continue Synthroid       Mild leukocytosis On steroids.    Estimated body mass index is 25.2 kg/m as calculated from the following:   Height as of this encounter: '5\' 4"'$  (1.626 m).   Weight as of this encounter: 66.6 kg.        Consultants: none.  Procedures performed: echo  Disposition: Home Diet recommendation:  Discharge Diet Orders (From admission, onward)     Start     Ordered   01/09/22 0000  Diet - low sodium heart healthy        01/09/22 0915           Dysphagia 2 diet.  DISCHARGE MEDICATION: Allergies as of 01/09/2022       Reactions   Duloxetine Other (See Comments)   confusion        Medication List     STOP taking these medications    ARTIFICIAL TEAR SOLUTION OP   doxycycline 100 MG capsule Commonly known as: VIBRAMYCIN   Eliquis 5 MG Tabs tablet Generic drug: apixaban   ipratropium 0.06 % nasal spray Commonly known as: ATROVENT   Lumify 0.025 % Soln Generic drug: Brimonidine Tartrate   omeprazole 20 MG capsule Commonly known as: PRILOSEC Replaced by: pantoprazole 40 MG tablet   Opcon-A 0.027-0.315 % Soln Generic drug: Naphazoline-Pheniramine   predniSONE 10 MG tablet Commonly known as: DELTASONE  TAKE these medications    acetaminophen 500 MG tablet Commonly known as: TYLENOL Take 1,000 mg by mouth every 6 (six) hours as needed for moderate pain.   albuterol 108 (90 Base) MCG/ACT inhaler Commonly known as: VENTOLIN HFA Inhale 2 puffs into the lungs every 6 (six) hours as needed for wheezing or shortness of breath.   amoxicillin-clavulanate 875-125 MG tablet Commonly known as: AUGMENTIN Take 1 tablet by mouth every 12 (twelve) hours for 6 days.   benzonatate 200 MG capsule Commonly known as: TESSALON Take 1 capsule (200 mg total) by mouth 3 (three)  times daily as needed for cough.   bismuth subsalicylate 160 MG chewable tablet Commonly known as: PEPTO BISMOL Chew 262 mg by mouth as needed for diarrhea or loose stools or indigestion.   CALCIUM 600 + D PO Take 1 tablet by mouth every evening.   colestipol 1 g tablet Commonly known as: COLESTID Take 2 g by mouth 2 (two) times daily.   cyanocobalamin 1000 MCG/ML injection Commonly known as: VITAMIN B12 Inject 1,000 mcg into the muscle every 30 (thirty) days.   diphenhydrAMINE 25 MG tablet Commonly known as: BENADRYL Take 25 mg by mouth daily as needed for allergies.   escitalopram 20 MG tablet Commonly known as: LEXAPRO Take 20 mg by mouth daily.   ferrous sulfate 325 (65 FE) MG tablet Take 325 mg by mouth daily with breakfast.   guaiFENesin-dextromethorphan 100-10 MG/5ML syrup Commonly known as: ROBITUSSIN DM Take 5 mLs by mouth every 4 (four) hours as needed for cough.   ipratropium-albuterol 0.5-2.5 (3) MG/3ML Soln Commonly known as: DUONEB Take 3 mLs by nebulization every 6 (six) hours as needed.   levothyroxine 125 MCG tablet Commonly known as: SYNTHROID Take 125 mcg by mouth daily before breakfast.   loperamide 2 MG tablet Commonly known as: IMODIUM A-D Take 2 mg by mouth 4 (four) times daily as needed for diarrhea or loose stools.   pantoprazole 40 MG tablet Commonly known as: PROTONIX Take 1 tablet (40 mg total) by mouth 2 (two) times daily. Replaces: omeprazole 20 MG capsule   sucralfate 1 g tablet Commonly known as: Carafate Take 1 tablet (1 g total) by mouth 4 (four) times daily -  with meals and at bedtime.   Vitamin D 50 MCG (2000 UT) tablet Take 2,000 Units by mouth daily.               Durable Medical Equipment  (From admission, onward)           Start     Ordered   01/09/22 1029  For home use only DME Walker rolling  Once       Question Answer Comment  Walker: With [place] Wheels   Patient needs a walker to treat with the  following condition Weakness generalized      01/09/22 1028   01/09/22 1006  For home use only DME Walker rolling  Once       Question Answer Comment  Walker: With [place]   Patient needs a walker to treat with the following condition Physical deconditioning      01/09/22 1005   01/09/22 0936  For home use only DME Nebulizer machine  Once       Question Answer Comment  Patient needs a nebulizer to treat with the following condition Acute bronchitis   Length of Need 6 Months      01/09/22 0935   01/08/22 1147  For home use  only DME 3 n 1  Once        01/08/22 1147            Follow-up Information     Kirk Ruths, MD. Go on 01/21/2022.   Specialty: Internal Medicine Why: Dr. Ouida Sills, Wednesday, 11/29 at 1:15 p.m.  [phone]) [phone] Contact information: [address] [facility] [place]  [address] [phone]         [facility] Follow up in 1 week(s).                 Discharge Exam: Filed Weights   01/07/22 1048  Weight: 66.6 kg   General exam: Appears calm and comfortable  Respiratory system: Clear to auscultation. Respiratory effort normal. Cardiovascular system: S1 & S2 heard, RRR. No JVD, murmurs, rubs, gallops or clicks. No pedal edema. Gastrointestinal system: Abdomen is nondistended, soft and nontender. No organomegaly or masses felt. Normal bowel sounds heard. Central nervous system: Alert and oriented. No focal neurological deficits. Extremities: Symmetric 5 x 5 power. Skin: No rashes, lesions or ulcers Psychiatry: Judgement and insight appear normal. Mood & affect appropriate.    Condition at discharge: fair  The results of significant diagnostics from this hospitalization (including imaging, microbiology, ancillary and laboratory) are listed below for reference.   Imaging Studies: ECHOCARDIOGRAM COMPLETE  Result Date: 01/08/2022    ECHOCARDIOGRAM REPORT   Patient Name:   KRIZIA FLIGHT Date of  Exam: 01/07/2022 Medical Rec #:  [id number]        Height:       64.0 in Accession #:    [id number]       Weight:       146.8 lb Date of Birth:  09-07-1932        BSA:          1.716 m Patient Age:    88 years         BP:           126/75 mmHg Patient Gender: F                HR:           68 bpm. Exam Location:  [facility] Procedure: 2D Echo, Cardiac Doppler and Color Doppler Indications:     I27.2 Pulmonary hypertension  History:         Patient has prior history of Echocardiogram examinations, most                  recent 08/31/2007. Signs/Symptoms:Shortness of Breath.  Sonographer:     Cresenciano Lick RDCS Referring Phys:  [id number] UVOZDGUY AGBATA Diagnosing Phys: Isaias Cowman MD IMPRESSIONS  1. Left ventricular ejection fraction, by estimation, is 60 to 65%. The left ventricle has normal function. The left ventricle has no regional wall motion abnormalities. Left ventricular diastolic parameters were normal.  2. Right ventricular systolic function is normal. The right ventricular size is normal.  3. The mitral valve is normal in structure. Mild mitral valve regurgitation. No evidence of mitral stenosis.  4. The aortic valve is normal in structure. Aortic valve regurgitation is mild. No aortic stenosis is present.  5. The inferior vena cava is normal in size with greater than 50% respiratory variability, suggesting right atrial pressure of 3 mmHg. FINDINGS  Left Ventricle: Left ventricular ejection fraction, by estimation, is 60 to 65%. The left ventricle has normal function. The left ventricle has no regional wall motion abnormalities. The left ventricular internal cavity  size was normal in size. There is  no left ventricular hypertrophy. Left ventricular diastolic parameters were normal. Right Ventricle: The right ventricular size is normal. No increase in right ventricular wall thickness. Right ventricular systolic function is normal. Left Atrium: Left atrial size was normal in size. Right Atrium: Right  atrial size was normal in size. Pericardium: There is no evidence of pericardial effusion. Mitral Valve: The mitral valve is normal in structure. Mild mitral valve regurgitation. No evidence of mitral valve stenosis. Tricuspid Valve: The tricuspid valve is normal in structure. Tricuspid valve regurgitation is mild . No evidence of tricuspid stenosis. Aortic Valve: The aortic valve is normal in structure. Aortic valve regurgitation is mild. Aortic regurgitation PHT measures 498 msec. No aortic stenosis is present. Pulmonic Valve: The pulmonic valve was normal in structure. Pulmonic valve regurgitation is not visualized. No evidence of pulmonic stenosis. Aorta: The aortic root is normal in size and structure. Venous: The inferior vena cava is normal in size with greater than 50% respiratory variability, suggesting right atrial pressure of 3 mmHg. IAS/Shunts: No atrial level shunt detected by color flow Doppler.  LEFT VENTRICLE PLAX 2D LVIDd:         4.50 cm   Diastology LVIDs:         2.90 cm   LV e' medial:    8.43 cm/s LV PW:         0.50 cm   LV E/e' medial:  6.1 LV IVS:        0.50 cm   LV e' lateral:   10.80 cm/s LVOT diam:     1.90 cm   LV E/e' lateral: 4.8 LV SV:         60 LV SV Index:   35 LVOT Area:     2.84 cm  RIGHT VENTRICLE             IVC RV Basal diam:  2.80 cm     IVC diam: 1.70 cm RV S prime:     10.79 cm/s TAPSE (M-mode): 1.3 cm LEFT ATRIUM             Index        RIGHT ATRIUM          Index LA diam:        3.60 cm 2.10 cm/m   RA Area:     9.73 cm LA Vol (A2C):   28.5 ml 16.61 ml/m  RA Volume:   19.70 ml 11.48 ml/m LA Vol (A4C):   50.7 ml 29.55 ml/m LA Biplane Vol: 38.7 ml 22.56 ml/m  AORTIC VALVE LVOT Vmax:   106.67 cm/s LVOT Vmean:  67.833 cm/s LVOT VTI:    0.211 m AI PHT:      498 msec  AORTA Ao Root diam: 2.70 cm MITRAL VALVE               TRICUSPID VALVE MV Area (PHT): 3.85 cm    TR Peak grad:   18.0 mmHg MV Decel Time: 197 msec    TR Vmax:        212.00 cm/s MV E velocity: 51.70 cm/s  MV A velocity: 80.50 cm/s  SHUNTS MV E/A ratio:  0.64        Systemic VTI:  0.21 m                            Systemic Diam: 1.90 cm Isaias Cowman MD Electronically signed by  Isaias Cowman MD Signature Date/Time: 01/08/2022/12:28:03 PM    Final    CT Angio Chest PE W/Cm &/Or Wo Cm  Result Date: 01/07/2022 CLINICAL DATA:  Pulmonary embolism suspected. EXAM: CT ANGIOGRAPHY CHEST WITH CONTRAST TECHNIQUE: Multidetector CT imaging of the chest was performed using the standard protocol during bolus administration of intravenous contrast. Multiplanar CT image reconstructions and MIPs were obtained to evaluate the vascular anatomy. RADIATION DOSE REDUCTION: This exam was performed according to the departmental dose-optimization program which includes automated exposure control, adjustment of the mA and/or kV according to patient size and/or use of iterative reconstruction technique. CONTRAST:  2m OMNIPAQUE IOHEXOL 350 MG/ML SOLN COMPARISON:  Medial patella sequela second FINDINGS: Cardiovascular: Satisfactory opacification of the pulmonary arteries to the segmental level. No evidence of pulmonary embolism. The heart is enlarged. Coronary artery atherosclerotic calcifications. Main pulmonary trunk is dilated measuring up to 3.2 cm concerning for pulmonary arterial hypertension. No pericardial effusion. Mediastinum/Nodes: No enlarged mediastinal, hilar, or axillary lymph nodes. Thyroid gland, trachea, and esophagus demonstrate no significant findings. Moderate size hiatal hernia. Lungs/Pleura: Biapical pleural/parenchymal scarring. No evidence of pneumonia or pulmonary edema. Bibasilar dependent atelectasis. Upper Abdomen: No acute abnormality. Musculoskeletal: Marked thoracic kyphoscoliosis.  No acute fracture. Review of the MIP images confirms the above findings. IMPRESSION: 1. No evidence of pulmonary embolism or other acute intrathoracic abnormality. 2. Cardiomegaly with coronary artery atherosclerotic  calcifications. 3. Pulmonary trunk is dilated concerning for pulmonary arterial hypertension. 4. Moderate size hiatal hernia. 5. Marked thoracic kyphoscoliosis. Electronically Signed   By: IKeane PoliceD.O.   On: 01/07/2022 14:26   DG Chest Port 1 View  Result Date: 01/07/2022 CLINICAL DATA:  Possible sepsis.  Cough for weeks. EXAM: PORTABLE CHEST 1 VIEW COMPARISON:  12/19/2021 FINDINGS: Right shoulder arthroplasty. Remote right upper rib fracture. Patient rotated to the left. Mild cardiomegaly with tortuous descending thoracic aorta. Atherosclerosis in the transverse aorta. No pleural effusion or pneumothorax. Diffuse interstitial thickening is nonspecific, especially in this age group. No lobar consolidation. Suspect pleuroparenchymal scarring in both apices, accentuated by obliquity and the chin overlying the left-greater-than-right apices. IMPRESSION: No evidence of pneumonia. Cardiomegaly Aortic Atherosclerosis (ICD10-I70.0). Electronically Signed   By: KAbigail MiyamotoM.D.   On: 01/07/2022 11:32   DG Chest 2 View  Result Date: 12/19/2021 CLINICAL DATA:  Chest pain. EXAM: CHEST - 2 VIEW COMPARISON:  Chest x-ray June 02, 2021. FINDINGS: Similar cardiomediastinal silhouette. Moderate hiatal hernia. No consolidation. No visible pleural effusions or pneumothorax. Prior distal right clavicle fixation and right reversed total shoulder arthroplasty. Multilevel degenerative change of the spine. IMPRESSION: 1. No active cardiopulmonary disease. 2. Moderate hiatal hernia. Electronically Signed   By: FMargaretha SheffieldM.D.   On: 12/19/2021 14:05    Microbiology: Results for orders placed or performed during the hospital encounter of 01/07/22  Resp Panel by RT-PCR (Flu A&B, Covid) Anterior Nasal Swab     Status: None   Collection Time: 01/07/22 11:30 AM   Specimen: Anterior Nasal Swab  Result Value Ref Range Status   SARS Coronavirus 2 by RT PCR NEGATIVE NEGATIVE Final    Comment: (NOTE) SARS-CoV-2 target  nucleic acids are NOT DETECTED.  The SARS-CoV-2 RNA is generally detectable in upper respiratory specimens during the acute phase of infection. The lowest concentration of SARS-CoV-2 viral copies this assay can detect is 138 copies/mL. A negative result does not preclude SARS-Cov-2 infection and should not be used as the sole basis for treatment or other patient management decisions. A negative result  may occur with  improper specimen collection/handling, submission of specimen other than nasopharyngeal swab, presence of viral mutation(s) within the areas targeted by this assay, and inadequate number of viral copies(<138 copies/mL). A negative result must be combined with clinical observations, patient history, and epidemiological information. The expected result is Negative.  Fact Sheet for Patients:  EntrepreneurPulse.com.au  Fact Sheet for Healthcare Providers:  IncredibleEmployment.be  This test is no t yet approved or cleared by the [place] FDA and  has been authorized for detection and/or diagnosis of SARS-CoV-2 by FDA under an Emergency Use Authorization (EUA). This EUA will remain  in effect (meaning this test can be used) for the duration of the COVID-19 declaration under Section 564(b)(1) of the Act, 21 U.S.C.section 360bbb-3(b)(1), unless the authorization is terminated  or revoked sooner.       Influenza A by PCR NEGATIVE NEGATIVE Final   Influenza B by PCR NEGATIVE NEGATIVE Final    Comment: (NOTE) The Xpert Xpress SARS-CoV-2/FLU/RSV plus assay is intended as an aid in the diagnosis of influenza from Nasopharyngeal swab specimens and should not be used as a sole basis for treatment. Nasal washings and aspirates are unacceptable for Xpert Xpress SARS-CoV-2/FLU/RSV testing.  Fact Sheet for Patients: EntrepreneurPulse.com.au  Fact Sheet for Healthcare  Providers: IncredibleEmployment.be  This test is not yet approved or cleared by the [place] FDA and has been authorized for detection and/or diagnosis of SARS-CoV-2 by FDA under an Emergency Use Authorization (EUA). This EUA will remain in effect (meaning this test can be used) for the duration of the COVID-19 declaration under Section 564(b)(1) of the Act, 21 U.S.C. section 360bbb-3(b)(1), unless the authorization is terminated or revoked.  Performed at [facility], [address] [place], [place] [address]   Respiratory (~20 pathogens) panel by PCR     Status: None   Collection Time: 01/07/22  3:57 PM   Specimen: Nasopharyngeal Swab; Respiratory  Result Value Ref Range Status   Adenovirus NOT DETECTED NOT DETECTED Final   Coronavirus 229E NOT DETECTED NOT DETECTED Final    Comment: (NOTE) The Coronavirus on the Respiratory Panel, DOES NOT test for the novel  Coronavirus (2019 nCoV)    Coronavirus HKU1 NOT DETECTED NOT DETECTED Final   Coronavirus NL63 NOT DETECTED NOT DETECTED Final   Coronavirus OC43 NOT DETECTED NOT DETECTED Final   Metapneumovirus NOT DETECTED NOT DETECTED Final   Rhinovirus / Enterovirus NOT DETECTED NOT DETECTED Final   Influenza A NOT DETECTED NOT DETECTED Final   Influenza B NOT DETECTED NOT DETECTED Final   Parainfluenza Virus 1 NOT DETECTED NOT DETECTED Final   Parainfluenza Virus 2 NOT DETECTED NOT DETECTED Final   Parainfluenza Virus 3 NOT DETECTED NOT DETECTED Final   Parainfluenza Virus 4 NOT DETECTED NOT DETECTED Final   Respiratory Syncytial Virus NOT DETECTED NOT DETECTED Final   Bordetella pertussis NOT DETECTED NOT DETECTED Final   Bordetella Parapertussis NOT DETECTED NOT DETECTED Final   Chlamydophila pneumoniae NOT DETECTED NOT DETECTED Final   Mycoplasma pneumoniae NOT DETECTED NOT DETECTED Final    Comment: Performed at [facility], [place]. [address], [place], [place] [address]     Labs: CBC: Recent Labs  Lab 01/07/22 1119 01/08/22 0353  WBC 11.7* 11.7*  NEUTROABS 9.3*  --   HGB 12.6 11.3*  HCT 40.2 35.3*  MCV 92.2 90.5  PLT 332 086   Basic Metabolic Panel: Recent Labs  Lab 01/07/22 1119 01/08/22 0353  NA 142 139  K  4.5 3.7  CL 105 105  CO2 29 26  GLUCOSE 109* 90  BUN 9 9  CREATININE 0.98 0.86  CALCIUM 9.6 8.7*   Liver Function Tests: Recent Labs  Lab 01/07/22 1119  AST 25  ALT 19  ALKPHOS 81  BILITOT 0.8  PROT 6.9  ALBUMIN 3.3*   CBG: No results for input(s): "GLUCAP" in the last 168 hours.  Discharge time spent: 40 minutes.   Signed: Hosie Poisson, MD [facility] 01/09/2022

## 2022-01-13 LAB — BLOOD GAS, VENOUS
Acid-Base Excess: 1 mmol/L (ref 0.0–2.0)
Bicarbonate: 26.6 mmol/L (ref 20.0–28.0)
O2 Saturation: 81.6 %
Patient temperature: 37
pCO2, Ven: 45 mmHg (ref 44–60)
pH, Ven: 7.38 (ref 7.25–7.43)
pO2, Ven: 47 mmHg — ABNORMAL HIGH (ref 32–45)

## 2022-01-16 ENCOUNTER — Telehealth: Payer: Self-pay | Admitting: Student

## 2022-01-16 NOTE — Telephone Encounter (Signed)
Palliative NP spoke with patient's husband regarding changes/restructuring of Palliative in the community. Patient does not currently meet criteria. He is encouraged to call if patient declines and is need of hospice; he expresses understanding.

## 2022-01-21 ENCOUNTER — Emergency Department: Payer: PPO

## 2022-01-21 ENCOUNTER — Inpatient Hospital Stay
Admission: EM | Admit: 2022-01-21 | Discharge: 2022-01-27 | DRG: 175 | Disposition: A | Discharge status: Home Health | Payer: PPO | Attending: Internal Medicine | Admitting: Internal Medicine

## 2022-01-21 ENCOUNTER — Inpatient Hospital Stay: Payer: PPO

## 2022-01-21 ENCOUNTER — Other Ambulatory Visit: Payer: Self-pay

## 2022-01-21 ENCOUNTER — Encounter: Payer: Self-pay | Admitting: Internal Medicine

## 2022-01-21 DIAGNOSIS — J121 Respiratory syncytial virus pneumonia: Secondary | ICD-10-CM | POA: Diagnosis present

## 2022-01-21 DIAGNOSIS — M1990 Unspecified osteoarthritis, unspecified site: Secondary | ICD-10-CM | POA: Diagnosis present

## 2022-01-21 DIAGNOSIS — R252 Cramp and spasm: Secondary | ICD-10-CM | POA: Diagnosis present

## 2022-01-21 DIAGNOSIS — J440 Chronic obstructive pulmonary disease with acute lower respiratory infection: Secondary | ICD-10-CM | POA: Diagnosis present

## 2022-01-21 DIAGNOSIS — Z86718 Personal history of other venous thrombosis and embolism: Secondary | ICD-10-CM | POA: Diagnosis not present

## 2022-01-21 DIAGNOSIS — Z888 Allergy status to other drugs, medicaments and biological substances status: Secondary | ICD-10-CM

## 2022-01-21 DIAGNOSIS — Z1152 Encounter for screening for COVID-19: Secondary | ICD-10-CM | POA: Diagnosis not present

## 2022-01-21 DIAGNOSIS — I82411 Acute embolism and thrombosis of right femoral vein: Secondary | ICD-10-CM | POA: Diagnosis present

## 2022-01-21 DIAGNOSIS — R0602 Shortness of breath: Secondary | ICD-10-CM | POA: Diagnosis present

## 2022-01-21 DIAGNOSIS — D649 Anemia, unspecified: Secondary | ICD-10-CM | POA: Diagnosis present

## 2022-01-21 DIAGNOSIS — I82409 Acute embolism and thrombosis of unspecified deep veins of unspecified lower extremity: Secondary | ICD-10-CM | POA: Diagnosis present

## 2022-01-21 DIAGNOSIS — I2699 Other pulmonary embolism without acute cor pulmonale: Principal | ICD-10-CM | POA: Diagnosis present

## 2022-01-21 DIAGNOSIS — Z7901 Long term (current) use of anticoagulants: Secondary | ICD-10-CM

## 2022-01-21 DIAGNOSIS — E890 Postprocedural hypothyroidism: Secondary | ICD-10-CM | POA: Diagnosis present

## 2022-01-21 DIAGNOSIS — R7989 Other specified abnormal findings of blood chemistry: Secondary | ICD-10-CM | POA: Diagnosis not present

## 2022-01-21 DIAGNOSIS — K449 Diaphragmatic hernia without obstruction or gangrene: Secondary | ICD-10-CM | POA: Diagnosis present

## 2022-01-21 DIAGNOSIS — F32A Depression, unspecified: Secondary | ICD-10-CM | POA: Diagnosis present

## 2022-01-21 DIAGNOSIS — K219 Gastro-esophageal reflux disease without esophagitis: Secondary | ICD-10-CM | POA: Diagnosis not present

## 2022-01-21 DIAGNOSIS — F0393 Unspecified dementia, unspecified severity, with mood disturbance: Secondary | ICD-10-CM | POA: Diagnosis present

## 2022-01-21 DIAGNOSIS — J690 Pneumonitis due to inhalation of food and vomit: Secondary | ICD-10-CM | POA: Diagnosis not present

## 2022-01-21 DIAGNOSIS — N390 Urinary tract infection, site not specified: Secondary | ICD-10-CM | POA: Diagnosis not present

## 2022-01-21 DIAGNOSIS — E039 Hypothyroidism, unspecified: Secondary | ICD-10-CM | POA: Diagnosis not present

## 2022-01-21 DIAGNOSIS — Z8249 Family history of ischemic heart disease and other diseases of the circulatory system: Secondary | ICD-10-CM

## 2022-01-21 DIAGNOSIS — Z96611 Presence of right artificial shoulder joint: Secondary | ICD-10-CM | POA: Diagnosis present

## 2022-01-21 DIAGNOSIS — N3000 Acute cystitis without hematuria: Secondary | ICD-10-CM | POA: Diagnosis not present

## 2022-01-21 DIAGNOSIS — Z515 Encounter for palliative care: Secondary | ICD-10-CM

## 2022-01-21 DIAGNOSIS — Z79890 Hormone replacement therapy (postmenopausal): Secondary | ICD-10-CM

## 2022-01-21 DIAGNOSIS — Z79899 Other long term (current) drug therapy: Secondary | ICD-10-CM

## 2022-01-21 DIAGNOSIS — Z66 Do not resuscitate: Secondary | ICD-10-CM | POA: Diagnosis present

## 2022-01-21 DIAGNOSIS — J441 Chronic obstructive pulmonary disease with (acute) exacerbation: Secondary | ICD-10-CM | POA: Diagnosis present

## 2022-01-21 DIAGNOSIS — Z7189 Other specified counseling: Secondary | ICD-10-CM | POA: Diagnosis not present

## 2022-01-21 DIAGNOSIS — F03911 Unspecified dementia, unspecified severity, with agitation: Secondary | ICD-10-CM | POA: Diagnosis present

## 2022-01-21 DIAGNOSIS — Z85828 Personal history of other malignant neoplasm of skin: Secondary | ICD-10-CM

## 2022-01-21 DIAGNOSIS — J209 Acute bronchitis, unspecified: Secondary | ICD-10-CM

## 2022-01-21 DIAGNOSIS — Z8701 Personal history of pneumonia (recurrent): Secondary | ICD-10-CM

## 2022-01-21 DIAGNOSIS — Z9049 Acquired absence of other specified parts of digestive tract: Secondary | ICD-10-CM

## 2022-01-21 LAB — RESPIRATORY PANEL BY PCR

## 2022-01-21 LAB — COMPREHENSIVE METABOLIC PANEL
ALT: 14 U/L (ref 0–44)
AST: 21 U/L (ref 15–41)
Albumin: 3.3 g/dL — ABNORMAL LOW (ref 3.5–5.0)
Alkaline Phosphatase: 84 U/L (ref 38–126)
Anion gap: 8 (ref 5–15)
BUN: 9 mg/dL (ref 8–23)
CO2: 30 mmol/L (ref 22–32)
Calcium: 9 mg/dL (ref 8.9–10.3)
Chloride: 106 mmol/L (ref 98–111)
Creatinine, Ser: 0.8 mg/dL (ref 0.44–1.00)
GFR, Estimated: >60 mL/min (ref >60)
Glucose, Bld: 100 mg/dL — ABNORMAL HIGH (ref 70–99)
Potassium: 3.6 mmol/L (ref 3.5–5.1)
Sodium: 144 mmol/L (ref 135–145)
Total Bilirubin: 0.8 mg/dL (ref 0.3–1.2)
Total Protein: 7 g/dL (ref 6.5–8.1)

## 2022-01-21 LAB — CBC WITH DIFFERENTIAL/PLATELET
Abs Immature Granulocytes: 0.11 10*3/uL — ABNORMAL HIGH (ref 0.00–0.07)
Basophils Absolute: 0.1 10*3/uL (ref 0.0–0.1)
Basophils Relative: 1 %
Eosinophils Absolute: 0.5 10*3/uL (ref 0.0–0.5)
Eosinophils Relative: 4 %
HCT: 41.1 % (ref 36.0–46.0)
Hemoglobin: 12.7 g/dL (ref 12.0–15.0)
Immature Granulocytes: 1 %
Lymphocytes Relative: 24 %
Lymphs Abs: 3 10*3/uL (ref 0.7–4.0)
MCH: 29.2 pg (ref 26.0–34.0)
MCHC: 30.9 g/dL (ref 30.0–36.0)
MCV: 94.5 fL (ref 80.0–100.0)
Monocytes Absolute: 1.1 10*3/uL — ABNORMAL HIGH (ref 0.1–1.0)
Monocytes Relative: 9 %
Neutro Abs: 7.8 10*3/uL — ABNORMAL HIGH (ref 1.7–7.7)
Neutrophils Relative %: 61 %
Platelets: 354 10*3/uL (ref 150–400)
RBC: 4.35 MIL/uL (ref 3.87–5.11)
RDW: 14.3 % (ref 11.5–15.5)
WBC: 12.6 10*3/uL — ABNORMAL HIGH (ref 4.0–10.5)
nRBC: 0 % (ref 0.0–0.2)

## 2022-01-21 LAB — HEPARIN LEVEL (UNFRACTIONATED)
Heparin Unfractionated: >1.1 IU/mL — ABNORMAL HIGH (ref 0.30–0.70)
Heparin Unfractionated: >1.1 IU/mL — ABNORMAL HIGH (ref 0.30–0.70)

## 2022-01-21 LAB — APTT
aPTT: >200 seconds (ref 24–36)
aPTT: >200 seconds (ref 24–36)

## 2022-01-21 LAB — SARS CORONAVIRUS 2 BY RT PCR: SARS Coronavirus 2 by RT PCR: NEGATIVE

## 2022-01-21 LAB — BRAIN NATRIURETIC PEPTIDE: B Natriuretic Peptide: 43.2 pg/mL (ref 0.0–100.0)

## 2022-01-21 LAB — TROPONIN I (HIGH SENSITIVITY): Troponin I (High Sensitivity): 6 ng/L (ref <18)

## 2022-01-21 LAB — LACTIC ACID, PLASMA: Lactic Acid, Venous: 1.9 mmol/L (ref 0.5–1.9)

## 2022-01-21 LAB — PROCALCITONIN: Procalcitonin: <0.1 ng/mL

## 2022-01-21 LAB — D-DIMER, QUANTITATIVE: D-Dimer, Quant: 2.21 ug/mL-FEU — ABNORMAL HIGH (ref 0.00–0.50)

## 2022-01-21 MED ORDER — FERROUS SULFATE 325 (65 FE) MG PO TABS
325.0000 mg | ORAL_TABLET | Freq: Every day | ORAL | Status: DC
  Administered 2022-01-22 – 2022-01-26 (×5): 325 mg via ORAL
  Filled 2022-01-21 (×5): qty 1

## 2022-01-21 MED ORDER — ONDANSETRON HCL 4 MG/2ML IJ SOLN: 4.0000 mg | Freq: Three times a day (TID) | INTRAMUSCULAR | Status: DC | PRN

## 2022-01-21 MED ORDER — HEPARIN SODIUM (PORCINE) 5000 UNIT/ML IJ SOLN: 5000.0000 [IU] | Freq: Three times a day (TID) | INTRAMUSCULAR | Status: DC

## 2022-01-21 MED ORDER — IPRATROPIUM-ALBUTEROL 0.5-2.5 (3) MG/3ML IN SOLN
3.0000 mL | Freq: Once | RESPIRATORY_TRACT | Status: AC
  Administered 2022-01-21: 3 mL via RESPIRATORY_TRACT
  Filled 2022-01-21: qty 3

## 2022-01-21 MED ORDER — OYSTER SHELL CALCIUM/D3 500-5 MG-MCG PO TABS
1.0000 | ORAL_TABLET | Freq: Every evening | ORAL | Status: DC
  Administered 2022-01-21 – 2022-01-24 (×2): 1 via ORAL
  Filled 2022-01-21 (×4): qty 1

## 2022-01-21 MED ORDER — IOHEXOL 350 MG/ML SOLN
75.0000 mL | Freq: Once | INTRAVENOUS | Status: AC | PRN
  Administered 2022-01-21: 75 mL via INTRAVENOUS

## 2022-01-21 MED ORDER — SUCRALFATE 1 G PO TABS
1.0000 g | ORAL_TABLET | Freq: Three times a day (TID) | ORAL | Status: DC
  Administered 2022-01-21 – 2022-01-26 (×14): 1 g via ORAL
  Filled 2022-01-21 (×14): qty 1

## 2022-01-21 MED ORDER — HEPARIN (PORCINE) 25000 UT/250ML-% IV SOLN
850.0000 [IU]/h | INTRAVENOUS | Status: DC
  Administered 2022-01-22 (×2): 1000 [IU]/h via INTRAVENOUS
  Filled 2022-01-21: qty 250

## 2022-01-21 MED ORDER — VITAMIN D 25 MCG (1000 UNIT) PO TABS
2000.0000 [IU] | ORAL_TABLET | Freq: Every day | ORAL | Status: DC
  Administered 2022-01-21 – 2022-01-26 (×6): 2000 [IU] via ORAL
  Filled 2022-01-21 (×6): qty 2

## 2022-01-21 MED ORDER — HEPARIN BOLUS VIA INFUSION
4500.0000 [IU] | Freq: Once | INTRAVENOUS | Status: AC
  Administered 2022-01-21: 4500 [IU] via INTRAVENOUS
  Filled 2022-01-21: qty 4500

## 2022-01-21 MED ORDER — CYANOCOBALAMIN 1000 MCG/ML IJ SOLN: 1000.0000 ug | INTRAMUSCULAR | Status: DC

## 2022-01-21 MED ORDER — AZITHROMYCIN 250 MG PO TABS
250.0000 mg | ORAL_TABLET | Freq: Every day | ORAL | Status: AC
  Administered 2022-01-22 – 2022-01-25 (×4): 250 mg via ORAL
  Filled 2022-01-21 (×4): qty 1

## 2022-01-21 MED ORDER — AZITHROMYCIN 500 MG PO TABS
500.0000 mg | ORAL_TABLET | Freq: Every day | ORAL | Status: AC
  Administered 2022-01-21: 500 mg via ORAL
  Filled 2022-01-21: qty 1

## 2022-01-21 MED ORDER — ALBUTEROL SULFATE (2.5 MG/3ML) 0.083% IN NEBU: 2.5000 mg | INHALATION_SOLUTION | RESPIRATORY_TRACT | Status: DC | PRN

## 2022-01-21 MED ORDER — LEVOTHYROXINE SODIUM 50 MCG PO TABS
125.0000 ug | ORAL_TABLET | Freq: Every day | ORAL | Status: DC
  Administered 2022-01-22 – 2022-01-26 (×5): 125 ug via ORAL
  Filled 2022-01-21 (×2): qty 1
  Filled 2022-01-21: qty 3
  Filled 2022-01-21 (×2): qty 1

## 2022-01-21 MED ORDER — LOPERAMIDE HCL 2 MG PO CAPS: 2.0000 mg | ORAL_CAPSULE | Freq: Four times a day (QID) | ORAL | Status: DC | PRN

## 2022-01-21 MED ORDER — METHYLPREDNISOLONE SODIUM SUCC 40 MG IJ SOLR
40.0000 mg | Freq: Two times a day (BID) | INTRAMUSCULAR | Status: DC
  Administered 2022-01-21 – 2022-01-22 (×3): 40 mg via INTRAVENOUS
  Filled 2022-01-21 (×3): qty 1

## 2022-01-21 MED ORDER — HEPARIN (PORCINE) 25000 UT/250ML-% IV SOLN
1200.0000 [IU]/h | INTRAVENOUS | Status: DC
  Administered 2022-01-21: 1200 [IU]/h via INTRAVENOUS
  Filled 2022-01-21: qty 250

## 2022-01-21 MED ORDER — ESCITALOPRAM OXALATE 10 MG PO TABS
20.0000 mg | ORAL_TABLET | Freq: Every day | ORAL | Status: DC
  Administered 2022-01-21 – 2022-01-26 (×6): 20 mg via ORAL
  Filled 2022-01-21 (×6): qty 2

## 2022-01-21 MED ORDER — ACETAMINOPHEN 325 MG PO TABS
650.0000 mg | ORAL_TABLET | Freq: Four times a day (QID) | ORAL | Status: DC | PRN
  Administered 2022-01-24 – 2022-01-25 (×3): 650 mg via ORAL
  Filled 2022-01-21 (×3): qty 2

## 2022-01-21 MED ORDER — DM-GUAIFENESIN ER 30-600 MG PO TB12
1.0000 | ORAL_TABLET | Freq: Two times a day (BID) | ORAL | Status: DC | PRN
  Administered 2022-01-22 – 2022-01-26 (×3): 1 via ORAL
  Filled 2022-01-21 (×3): qty 1

## 2022-01-21 MED ORDER — HALOPERIDOL LACTATE 5 MG/ML IJ SOLN
2.0000 mg | Freq: Once | INTRAMUSCULAR | Status: AC
  Administered 2022-01-21: 2 mg via INTRAVENOUS
  Filled 2022-01-21: qty 1

## 2022-01-21 MED ORDER — AZITHROMYCIN 500 MG PO TABS: 500.0000 mg | ORAL_TABLET | Freq: Every day | ORAL | Status: AC

## 2022-01-21 MED ORDER — IPRATROPIUM-ALBUTEROL 0.5-2.5 (3) MG/3ML IN SOLN
3.0000 mL | RESPIRATORY_TRACT | Status: DC
  Administered 2022-01-21 – 2022-01-22 (×7): 3 mL via RESPIRATORY_TRACT
  Filled 2022-01-21 (×7): qty 3

## 2022-01-21 MED ORDER — PANTOPRAZOLE SODIUM 40 MG PO TBEC
40.0000 mg | DELAYED_RELEASE_TABLET | Freq: Two times a day (BID) | ORAL | Status: DC
  Administered 2022-01-22 – 2022-01-26 (×9): 40 mg via ORAL
  Filled 2022-01-21 (×10): qty 1

## 2022-01-21 NOTE — ED Notes (Signed)
Lab called for blood draw. Patient has IV that flows well but will not draw. Lab informed that they only have two employees on the floor currently

## 2022-01-21 NOTE — H&P (Signed)
History and Physical    Brenda Goodman XHB:[id number] DOB: 02-05-1933 DOA: 01/21/2022  Referring MD/NP/PA:   PCP: Kirk Ruths, MD   Patient coming from:  The patient is coming from home.     Chief Complaint: Cough, shortness of breath  HPI: Brenda Goodman is a 88 y.o. female with medical history significant of dementia, GERD, hypothyroidism, DVT not on anticoagulants, who presents with cough and shortness breath.  Patient was recently hospitalized from 11/15 - 11/17 due to acute hypoxic respiratory failure with unclear etiology.  Patient does not have history of COPD.  2D echo was done which showed EF of 60 to 65%.  Patient completed course of Augmentin for possible aspiration pneumonitis.  Per her husband and caregiver at bedside, patient continues to have cough and shortness breath in the past several days.  Patient has dry cough, no chest pain, fever or chills.  Patient had nausea and vomited once in the early morning, which has resolved.  She also had diarrhea on Monday which has resolved.  Currently patient does not have nausea, vomiting, diarrhea or abdominal pain.  Patient has bilateral leg cramps per caregiver.  Patient has history of dementia.  At her normal baseline, patient recognizes family members and knows the place, but usually not orientated to the time.  Her mental status is close to baseline today.   Data reviewed independently and ED Course: pt was found to have positive D-dimer 2.21, negative COVID PCR, electrolytes renal function okay, WBC 12.6, BNP 43.2, lactic acid 1.9, troponin level 6.  Chest x-ray showed chronic changes without infiltration.  CTA showed acute segmental PE in RLL and RUL without CT evidence of right heart strain.  CTA: 1. New acute pulmonary emboli in the right lower lobe and left upper lobe segmental pulmonary arteries. RV/LV ratio <1. 2. Apparent focal filling defect in the LAD is most likely artifactual, but further evaluation with an  EKG is recommended. 3. Redemonstrated large hiatal hernia which is fluid-filled. The esophagus superior to this level is patulous. There is debris within the bilateral lower lobe bronchi, which could suggest component of chronic aspiration. 4. Crescentic configuration of the trachea and right lower lobe bronchus is most likely due to expiratory phase of imaging, but a component of tracheobronchomalacia is also a differential consideration.     LE venous doppler: 1. Chronic, partially occlusive thrombus present within the distal right femoral vein. 2. No evidence of deep venous thrombosis in the left lower extremity.   EKG: I have personally reviewed.  Sinus rhythm, QTc 447, LAD, low voltage   Review of Systems:   General: no fevers, chills, no body weight gain, has fatigue HEENT: no blurry vision, hearing changes or sore throat Respiratory: has dyspnea, coughing, wheezing CV: no chest pain, no palpitations GI: no nausea, vomiting, abdominal pain, diarrhea, constipation GU: no dysuria, burning on urination, increased urinary frequency, hematuria  Ext: no leg edema Neuro: no unilateral weakness, numbness, or tingling, no vision change or hearing loss Skin: no rash, no skin tear. MSK: No muscle spasm, no deformity, no limitation of range of movement in spin Heme: No easy bruising.  Travel history: No recent long distant travel.   Allergy:  Allergies  Allergen Reactions   Duloxetine Other (See Comments)    confusion    Past Medical History:  Diagnosis Date   Actinic keratosis 09/11/2015   right nasal bridge   Anemia    followed by pcp and unconcerned.   Arthritis  Cancer ([place])    skin   Cough    CHRONIC   Dementia (HCC)    Depression    Diverticulosis    Dizziness    possibly cardiac related   GERD (gastroesophageal reflux disease)    H/O hiatal hernia    Hx of basal cell carcinoma 07/20/2012   L posterior neck   Hx of basal cell carcinoma 12/07/2016   R  proximal medial calf   Hypothyroidism    PONV (postoperative nausea and vomiting)    Shortness of breath    COUGH , magnesium helps this   Squamous cell carcinoma of skin 08/16/2008   R lower leg, excised 10/10/2008   Wheezing     Past Surgical History:  Procedure Laterality Date   CARDIOVASCULAR STRESS TEST     2012   [place]      CATARACT EXTRACTION W/PHACO Right 02/26/2015   Procedure: CATARACT EXTRACTION PHACO AND INTRAOCULAR LENS PLACEMENT ([place]);  Surgeon: Birder Robson, MD;  Location: [facility];  Service: Ophthalmology;  Laterality: Right;  [place]  02:00 AP% 27.4 CDE 32.96 fluid pack lot # [id number]   CATARACT EXTRACTION W/PHACO Left 03/19/2015   Procedure: CATARACT EXTRACTION PHACO AND INTRAOCULAR LENS PLACEMENT ([facility]);  Surgeon: Birder Robson, MD;  Location: [facility];  Service: Ophthalmology;  Laterality: Left;  [place] 02:07 AP% 26.0 CDE 33.14 fluid pack lot # [id number]   CATARACT EXTRACTION, BILATERAL     CHOLECYSTECTOMY     EYE SURGERY     hardware in right shoulder Right 2010   ball and pin in shoulder   HERNIA REPAIR     2012   JOINT REPLACEMENT     LUMBAR LAMINECTOMY/DECOMPRESSION MICRODISCECTOMY  05/04/2011   Procedure: LUMBAR LAMINECTOMY/DECOMPRESSION MICRODISCECTOMY 1 LEVEL;  Surgeon: Ophelia Charter, MD;  Location: [place] [facility];  Service: Neurosurgery;  Laterality: N/A;  Lumbar three and lumbar four Laminectomy   NO PAST SURGERIES     RIGHT ARM SURGERY BALL + PIN    REVERSE SHOULDER ARTHROPLASTY Right 09/04/2016   Procedure: RIGHT REVERSE TOTAL SHOULDER ARTHROPLASTY REVISION;  Surgeon: Netta Cedars, MD;  Location: [place];  Service: Orthopedics;  Laterality: Right;   THYROID LOBECTOMY  2016    APPROX 4 YRS AGO    THYROIDECTOMY     TONSILLECTOMY     TOTAL SHOULDER ARTHROPLASTY     Right    Social History:  reports that she has never smoked. She has never used smokeless tobacco. She reports that she does not drink alcohol and does not use  drugs.  Family History:  Family History  Problem Relation Age of Onset   Heart attack Mother    Dementia Father      Prior to Admission medications   Medication Sig Start Date End Date Taking? Authorizing Provider  acetaminophen (TYLENOL) 500 MG tablet Take 1,000 mg by mouth every 6 (six) hours as needed for moderate pain.    [provider]  albuterol (VENTOLIN HFA) 108 (90 Base) MCG/ACT inhaler Inhale 2 puffs into the lungs every 6 (six) hours as needed for wheezing or shortness of breath.    [provider]  benzonatate (TESSALON) 200 MG capsule Take 1 capsule (200 mg total) by mouth 3 (three) times daily as needed for cough. 01/09/22   Hosie Poisson, MD  bismuth subsalicylate (PEPTO BISMOL) 262 MG chewable tablet Chew 262 mg by mouth as needed for diarrhea or loose stools or indigestion.    [provider]  Calcium Carb-Cholecalciferol (  CALCIUM 600 + D PO) Take 1 tablet by mouth every evening.    [provider]  Cholecalciferol (VITAMIN D) 50 MCG (2000 UT) tablet Take 2,000 Units by mouth daily.    [provider]  colestipol (COLESTID) 1 G tablet Take 2 g by mouth 2 (two) times daily. Patient not taking: Reported on 01/07/2022    [provider]  cyanocobalamin (,VITAMIN B-12,) 1000 MCG/ML injection Inject 1,000 mcg into the muscle every 30 (thirty) days. 03/25/20   [provider]  diphenhydrAMINE (BENADRYL) 25 MG tablet Take 25 mg by mouth daily as needed for allergies.    [provider]  escitalopram (LEXAPRO) 20 MG tablet Take 20 mg by mouth daily.    [provider]  ferrous sulfate 325 (65 FE) MG tablet Take 325 mg by mouth daily with breakfast.    [provider]  guaiFENesin-dextromethorphan (ROBITUSSIN DM) 100-10 MG/5ML syrup Take 5 mLs by mouth every 4 (four) hours as needed for cough. 01/09/22   Hosie Poisson, MD  ipratropium-albuterol (DUONEB) 0.5-2.5 (3) MG/3ML SOLN Take 3 mLs by  nebulization every 6 (six) hours as needed. 01/09/22   Hosie Poisson, MD  levothyroxine (SYNTHROID, LEVOTHROID) 125 MCG tablet Take 125 mcg by mouth daily before breakfast.    [provider]  loperamide (IMODIUM A-D) 2 MG tablet Take 2 mg by mouth 4 (four) times daily as needed for diarrhea or loose stools.    [provider]  pantoprazole (PROTONIX) 40 MG tablet Take 1 tablet (40 mg total) by mouth 2 (two) times daily. 01/09/22   Hosie Poisson, MD  sucralfate (CARAFATE) 1 g tablet Take 1 tablet (1 g total) by mouth 4 (four) times daily -  with meals and at bedtime. 12/19/21 12/19/22  Teodoro Spray, PA    Physical Exam: Vitals:   01/21/22 0804 01/21/22 0822 01/21/22 0930 01/21/22 1629  BP:   124/86 139/79  Pulse:   87 92  Resp:   16 18  Temp:  97.8 F (36.6 C)  98.5 F (36.9 C)  TempSrc:  Oral  Oral  SpO2:   95% 93%  Weight: 65.4 kg     Height: '5\' 4"'$  (1.626 m)      General: Not in acute distress HEENT:       Eyes: PERRL, EOMI, no scleral icterus.       ENT: No discharge from the ears and nose, no pharynx injection, no tonsillar enlargement.        Neck: No JVD, no bruit, no mass felt. Heme: No neck lymph node enlargement. Cardiac: S1/S2, RRR, No murmurs, No gallops or rubs. Respiratory: Has coarse breathing sound bilaterally.  Has wheezing bilaterally. GI: Soft, nondistended, nontender, no rebound pain, no organomegaly, BS present. GU: No hematuria Ext: No pitting leg edema bilaterally. 1+DP/PT pulse bilaterally. Musculoskeletal: No joint deformities, No joint redness or warmth, no limitation of ROM in spin. Skin: No rashes.  Neuro: Alert, oriented X3, cranial nerves II-XII grossly intact, moves all extremities normally.  Psych: Patient is not psychotic, no suicidal or hemocidal ideation.  Labs on Admission: I have personally reviewed following labs and imaging studies  CBC: Recent Labs  Lab 01/21/22 1101  WBC 12.6*  NEUTROABS 7.8*  HGB 12.7   HCT 41.1  MCV 94.5  PLT 638   Basic Metabolic Panel: Recent Labs  Lab 01/21/22 1101  NA 144  K 3.6  CL 106  CO2 30  GLUCOSE 100*  BUN 9  CREATININE 0.80  CALCIUM 9.0   GFR: Estimated Creatinine Clearance: 42.8 mL/min (by C-G formula based on SCr of 0.8 mg/dL). Liver Function Tests: Recent Labs  Lab 01/21/22 1101  AST 21  ALT 14  ALKPHOS 84  BILITOT 0.8  PROT 7.0  ALBUMIN 3.3*   No results for input(s): "LIPASE", "AMYLASE" in the last 168 hours. No results for input(s): "AMMONIA" in the last 168 hours. Coagulation Profile: No results for input(s): "INR", "PROTIME" in the last 168 hours. Cardiac Enzymes: No results for input(s): "CKTOTAL", "CKMB", "CKMBINDEX", "TROPONINI" in the last 168 hours. BNP (last 3 results) No results for input(s): "PROBNP" in the last 8760 hours. HbA1C: No results for input(s): "HGBA1C" in the last 72 hours. CBG: No results for input(s): "GLUCAP" in the last 168 hours. Lipid Profile: No results for input(s): "CHOL", "HDL", "LDLCALC", "TRIG", "CHOLHDL", "LDLDIRECT" in the last 72 hours. Thyroid Function Tests: No results for input(s): "TSH", "T4TOTAL", "FREET4", "T3FREE", "THYROIDAB" in the last 72 hours. Anemia Panel: No results for input(s): "VITAMINB12", "FOLATE", "FERRITIN", "TIBC", "IRON", "RETICCTPCT" in the last 72 hours. Urine analysis:    Component Value Date/Time   COLORURINE YELLOW (A) 01/07/2022 1700   APPEARANCEUR CLEAR (A) 01/07/2022 1700   LABSPEC >1.046 (H) 01/07/2022 1700   PHURINE 5.0 01/07/2022 1700   GLUCOSEU NEGATIVE 01/07/2022 1700   HGBUR NEGATIVE 01/07/2022 1700   BILIRUBINUR NEGATIVE 01/07/2022 1700   KETONESUR 5 (A) 01/07/2022 1700   PROTEINUR NEGATIVE 01/07/2022 1700   UROBILINOGEN 1.0 12/20/2008 0923   NITRITE NEGATIVE 01/07/2022 1700   LEUKOCYTESUR NEGATIVE 01/07/2022 1700   Sepsis Labs: '@LABRCNTIP'$ (procalcitonin:4,lacticidven:4) ) Recent Results (from the past 240 hour(s))  SARS Coronavirus 2 by  RT PCR (hospital order, performed in [place] hospital lab) *cepheid single result test* Anterior Nasal Swab     Status: None   Collection Time: 01/21/22  8:22 AM   Specimen: Anterior Nasal Swab  Result Value Ref Range Status   SARS Coronavirus 2 by RT PCR NEGATIVE NEGATIVE Final    Comment: (NOTE) SARS-CoV-2 target nucleic acids are NOT DETECTED.  The SARS-CoV-2 RNA is generally detectable in upper and lower respiratory specimens during the acute phase of infection. The lowest concentration of SARS-CoV-2 viral copies this assay can detect is 250 copies / mL. A negative result does not preclude SARS-CoV-2 infection and should not be used as the sole basis for treatment or other patient management decisions.  A negative result may occur with improper specimen collection / handling, submission of specimen other than nasopharyngeal swab, presence of viral mutation(s) within the areas targeted by this assay, and inadequate number of viral copies (<250 copies / mL). A negative result must be combined with clinical observations, patient history, and epidemiological information.  Fact Sheet for Patients:   https://www.patel.info/  Fact Sheet for Healthcare Providers: https://hall.com/  This test is not yet approved or  cleared by the [place] FDA and has been authorized for detection and/or diagnosis of SARS-CoV-2 by FDA under an Emergency Use Authorization (EUA).  This EUA will remain in effect (meaning this test can be used) for the duration of the COVID-19 declaration under Section 564(b)(1) of the Act, 21 U.S.C. section 360bbb-3(b)(1), unless the authorization is terminated or revoked sooner.  Performed at [facility], [address] [place], [place] [address]      Radiological Exams on Admission: US Venous Img Lower Bilateral (DVT)  Result Date: 01/21/2022 CLINICAL DATA:  Positive D-dimer EXAM: BILATERAL LOWER  EXTREMITY VENOUS DOPPLER ULTRASOUND TECHNIQUE: Gray-scale sonography  with graded compression, as well as color Doppler and duplex ultrasound were performed to evaluate the lower extremity deep venous systems from the level of the common femoral vein and including the common femoral, femoral, profunda femoral, popliteal and calf veins including the posterior tibial, peroneal and gastrocnemius veins when visible. The superficial great saphenous vein was also interrogated. Spectral Doppler was utilized to evaluate flow at rest and with distal augmentation maneuvers in the common femoral, femoral and popliteal veins. COMPARISON:  06/17/2021 FINDINGS: RIGHT LOWER EXTREMITY Common Femoral Vein: No evidence of thrombus. Normal compressibility, respiratory phasicity and response to augmentation. Saphenofemoral Junction: No evidence of thrombus. Normal compressibility and flow on color Doppler imaging. Profunda Femoral Vein: No evidence of thrombus. Normal compressibility and flow on color Doppler imaging. Femoral Vein: Small amount of eccentric, partially occlusive thrombus present within the distal right femoral vein. Popliteal Vein: No evidence of thrombus. Normal compressibility, respiratory phasicity and response to augmentation. Calf Veins: No evidence of thrombus. Normal compressibility and flow on color Doppler imaging. Superficial Great Saphenous Vein: No evidence of thrombus. Normal compressibility. Venous Reflux:  None. Other Findings:  None. LEFT LOWER EXTREMITY Common Femoral Vein: No evidence of thrombus. Normal compressibility, respiratory phasicity and response to augmentation. Saphenofemoral Junction: No evidence of thrombus. Normal compressibility and flow on color Doppler imaging. Profunda Femoral Vein: No evidence of thrombus. Normal compressibility and flow on color Doppler imaging. Femoral Vein: No evidence of thrombus. Normal compressibility, respiratory phasicity and response to augmentation.  Popliteal Vein: No evidence of thrombus. Normal compressibility, respiratory phasicity and response to augmentation. Calf Veins: No evidence of thrombus. Normal compressibility and flow on color Doppler imaging. Superficial Great Saphenous Vein: No evidence of thrombus. Normal compressibility. Venous Reflux:  None. Other Findings:  None. IMPRESSION: 1. Chronic, partially occlusive thrombus present within the distal right femoral vein. 2. No evidence of deep venous thrombosis in the left lower extremity. Electronically Signed   By: Miachel Roux M.D.   On: 01/21/2022 13:56   CT Angio Chest PE W and/or Wo Contrast  Result Date: 01/21/2022 CLINICAL DATA:  PE Suspected. EXAM: CT ANGIOGRAPHY CHEST WITH CONTRAST TECHNIQUE: Multidetector CT imaging of the chest was performed using the standard protocol during bolus administration of intravenous contrast. Multiplanar CT image reconstructions and MIPs were obtained to evaluate the vascular anatomy. RADIATION DOSE REDUCTION: This exam was performed according to the departmental dose-optimization program which includes automated exposure control, adjustment of the mA and/or kV according to patient size and/or use of iterative reconstruction technique. CONTRAST:  32m OMNIPAQUE IOHEXOL 350 MG/ML SOLN COMPARISON:  CTA Chest 01/07/22 FINDINGS: Cardiovascular: Satisfactory opacification of the pulmonary arteries to the segmental level. New acute pulmonary emboli in the right lower lobe segmental pulmonary arteries (series 4, image 69), left upper lobe segmental pulmonary artery (series 4, image 63). There is an apparent focal filling defect in the LAD (series 4, image 62) which is most likely artifactual, but further evaluation with the EKG is recommended. Mediastinum/Nodes: No enlarged mediastinal, hilar, or axillary lymph nodes. The thyroid is poorly visualized. Redemonstrated is a large hiatal hernia which is fluid-filled. The esophagus superior to this level is patulous.  Trachea has a crescentic configuration, likely due to expiratory phase of imaging. The right mainstem bronchus also has a similar configuration, but appears markedly narrowed, which could suggest a component of tracheobronchomalacia. There is debris within the bilateral lower lobe bronchi, which could suggest component of chronic aspiration. Lungs/Pleura: No pleural effusion. No pneumothorax. No large consolidative opacity  is visualized. Assessed event for the presence of small pulmonary nodules is slightly limited due to the degree of respiratory motion artifact. There is right basilar atelectasis. Upper Abdomen: Status post cholecystectomy. There is likely a small punctate right-sided renal stone. No hydronephrosis. Musculoskeletal: Right shoulder arthroplasty. Likely healing chronic posterior right sixth rib fracture. Redemonstrated exaggerated thoracic kyphosis. Partially imaged screw and plate right clavicular fixation hardware. 1 partial opacification of the left maxillary sinus. Review of the MIP images confirms the above findings. IMPRESSION: 1. New acute pulmonary emboli in the right lower lobe and left upper lobe segmental pulmonary arteries. RV/LV ratio <1. 2. Apparent focal filling defect in the LAD is most likely artifactual, but further evaluation with an EKG is recommended. 3. Redemonstrated large hiatal hernia which is fluid-filled. The esophagus superior to this level is patulous. There is debris within the bilateral lower lobe bronchi, which could suggest component of chronic aspiration. 4. Crescentic configuration of the trachea and right lower lobe bronchus is most likely due to expiratory phase of imaging, but a component of tracheobronchomalacia is also a differential consideration. Electronically Signed   By: Marin Roberts M.D.   On: 01/21/2022 13:00   DG Chest Portable 1 View  Result Date: 01/21/2022 CLINICAL DATA:  Cough and shortness of breath. EXAM: PORTABLE CHEST 1 VIEW COMPARISON:   01/07/2022 FINDINGS: The cardiac silhouette, mediastinal and within limits and stable. Stable underlying chronic bronchitic type changes. No pulmonary infiltrates or pleural effusions. Stable moderate-sized hiatal hernia. The bony structures are. IMPRESSION: Chronic lung changes but no acute pulmonary findings. Electronically Signed   By: Marijo Sanes M.D.   On: 01/21/2022 08:48      Assessment/Plan Principal Problem:   Acute pulmonary embolism (HCC) Active Problems:   DVT (deep venous thrombosis) (HCC)   Aspiration pneumonitis (HCC)   Hypothyroidism   Hiatal hernia with gastroesophageal reflux   Positive D dimer   Depression   Assessment and Plan:  Acute pulmonary embolism and DVT (deep venous thrombosis) ([place]): CTA showed aew acute pulmonary emboli in the right lower lobe and left upper lobe segmental pulmonary arteries. RV/LV ratio <1. LE venous doppler showed chronic, partially occlusive thrombus present within the distal right femoral vein.  -will admit to tele bed as inpt -IV heparin -prn albuterol.  Recent aspiration pneumonitis [facility]): Patient still has coarse breathing sounds and wheezing on auscultation.  Has mild leukocytosis with WBC 12.6, does not meet criteria for sepsis.  Patient completed course of Augmentin recently -Bronchodilators -As needed Mucinex -Solu-Medrol 40 mg twice daily -Z-Pak  Hypothyroidism -Synthroid  Hiatal hernia with gastroesophageal reflux -Protonix and Carafate  Depression -Lexapro     DVT ppx: On IV Heparin      Code Status: DNR per her husband  Family Communication:  Yes, patient's  husband and carregiver  at bed side.     Disposition Plan:  Anticipate discharge back to previous environment  Consults called:  none  Admission status and Level of care: Telemetry Medical:     as inpt      Dispo: The patient is from: Home              Anticipated d/c is to: Home              Anticipated d/c date is: 2 days               Patient currently is not medically stable to d/c.    Severity of Illness:  The appropriate patient status  for this patient is INPATIENT. Inpatient status is judged to be reasonable and necessary in order to provide the required intensity of service to ensure the patient's safety. The patient's presenting symptoms, physical exam findings, and initial radiographic and laboratory data in the context of their chronic comorbidities is felt to place them at high risk for further clinical deterioration. Furthermore, it is not anticipated that the patient will be medically stable for discharge from the hospital within 2 midnights of admission.   * I certify that at the point of admission it is my clinical judgment that the patient will require inpatient hospital care spanning beyond 2 midnights from the point of admission due to high intensity of service, high risk for further deterioration and high frequency of surveillance required.*       Date of Service 01/21/2022    Ivor Costa [facility]   If 7PM-7AM, please contact night-coverage [email or web address] 01/21/2022, 6:43 PM

## 2022-01-21 NOTE — ED Provider Notes (Signed)
[facility] Provider Note    Event Date/Time   First MD Initiated Contact with Patient 01/21/22 0805     (approximate)   History   Shortness of Breath   HPI  Brenda Goodman is a 88 y.o. female  here with cough, SOB. History provided by EMS and husband. Pt was just hospitalized for PNA. Over last 24-48 hours has reportedly had worsening cough and shortness of breath.  She has had decreased energy level.  She has not wanted to eat or drink as much.  Husband called EMS today for evaluation.  History seems similar to when she was just hospitalized.  On my assessment, patient denies complaints.  She is confused which is her baseline.  Denies any pain.  Denies any chest pain specifically.    Record review: Admitted 11/15-17 for resp failure 2/2 aspiration, ? Wheezing - given augmentin and duonebs. 88% on arrival requiring 2L but weaned off at time of discharge.   Physical Exam   Triage Vital Signs: ED Triage Vitals  Enc Vitals Group     BP 01/21/22 0803 128/86     Pulse Rate 01/21/22 0803 78     Resp 01/21/22 0803 16     Temp --      Temp src --      SpO2 01/21/22 0803 90 %     Weight 01/21/22 0804 144 lb 2.9 oz (65.4 kg)     Height 01/21/22 0804 '5\' 4"'$  (1.626 m)     Head Circumference --      Peak Flow --      Pain Score 01/21/22 0804 0     Pain Loc --      Pain Edu? --      Excl. in [place]? --     Most recent vital signs: Vitals:   01/21/22 0930 01/21/22 1629  BP: 124/86 139/79  Pulse: 87 92  Resp: 16 18  Temp:  98.5 F (36.9 C)  SpO2: 95% 93%     General: Awake, no distress.  CV:  Good peripheral perfusion.  Regular rate and rhythm. Resp:  Slight tachypnea with globally diminished breath sounds, diffuse expiratory wheezing. Abd:  No distention.  Other:  No lower extremity edema.  Oriented to person but not place or time which is baseline.  No focal neurological deficits.   ED Results / Procedures / Treatments   Labs (all labs ordered  are listed, but only abnormal results are displayed) Labs Reviewed  CBC WITH DIFFERENTIAL/PLATELET - Abnormal; Notable for the following components:      Result Value   WBC 12.6 (*)    Neutro Abs 7.8 (*)    Monocytes Absolute 1.1 (*)    Abs Immature Granulocytes 0.11 (*)    All other components within normal limits  COMPREHENSIVE METABOLIC PANEL - Abnormal; Notable for the following components:   Glucose, Bld 100 (*)    Albumin 3.3 (*)    All other components within normal limits  D-DIMER, QUANTITATIVE - Abnormal; Notable for the following components:   D-Dimer, Quant 2.21 (*)    All other components within normal limits  HEPARIN LEVEL (UNFRACTIONATED) - Abnormal; Notable for the following components:   Heparin Unfractionated >1.10 (*)    All other components within normal limits  APTT - Abnormal; Notable for the following components:   aPTT >200 (*)    All other components within normal limits  SARS CORONAVIRUS 2 BY RT PCR  CULTURE, BLOOD (SINGLE)  EXPECTORATED SPUTUM ASSESSMENT W GRAM STAIN, RFLX TO RESP C  CULTURE, BLOOD (ROUTINE X 2)  CULTURE, BLOOD (ROUTINE X 2)  RESPIRATORY PANEL BY PCR  BRAIN NATRIURETIC PEPTIDE  LACTIC ACID, PLASMA  PROCALCITONIN  LEGIONELLA PNEUMOPHILA SEROGP 1 UR AG  HEPARIN LEVEL (UNFRACTIONATED)  BASIC METABOLIC PANEL  CBC  TROPONIN I (HIGH SENSITIVITY)     EKG Normal sinus rhythm, ventricular rate 78.  PR 185, QRS 86, QTc 447.  No acute ST elevations or depressions.  No EKG evidence of acute ischemia or infarct.   RADIOLOGY CT Angio: Pending CXR: Clear   I also independently reviewed and agree with radiologist interpretations.   PROCEDURES:  Critical Care performed: No   MEDICATIONS ORDERED IN ED: Medications  albuterol (PROVENTIL) (2.5 MG/3ML) 0.083% nebulizer solution 2.5 mg (has no administration in time range)  ipratropium-albuterol (DUONEB) 0.5-2.5 (3) MG/3ML nebulizer solution 3 mL (3 mLs Nebulization Given 01/21/22 1630)   dextromethorphan-guaiFENesin (MUCINEX DM) 30-600 MG per 12 hr tablet 1 tablet (has no administration in time range)  ondansetron (ZOFRAN) injection 4 mg (has no administration in time range)  acetaminophen (TYLENOL) tablet 650 mg (has no administration in time range)  methylPREDNISolone sodium succinate (SOLU-MEDROL) 40 mg/mL injection 40 mg (40 mg Intravenous Given 01/21/22 1343)  heparin ADULT infusion 100 units/mL (25000 units/248m) (1,200 Units/hr Intravenous New Bag/Given 01/21/22 1410)  escitalopram (LEXAPRO) tablet 20 mg (has no administration in time range)  levothyroxine (SYNTHROID) tablet 125 mcg (has no administration in time range)  loperamide (IMODIUM) capsule 2 mg (has no administration in time range)  pantoprazole (PROTONIX) EC tablet 40 mg (has no administration in time range)  sucralfate (CARAFATE) tablet 1 g (has no administration in time range)  cyanocobalamin (VITAMIN B12) injection 1,000 mcg (has no administration in time range)  ferrous sulfate tablet 325 mg (has no administration in time range)  calcium-vitamin D (OSCAL WITH D) 500-5 MG-MCG per tablet 1 tablet (has no administration in time range)  cholecalciferol (VITAMIN D3) 25 MCG (1000 UNIT) tablet 2,000 Units (has no administration in time range)  azithromycin (ZITHROMAX) tablet 500 mg (500 mg Oral Not Given 01/21/22 1917)    Followed by  azithromycin (ZITHROMAX) tablet 250 mg (has no administration in time range)  azithromycin (ZITHROMAX) tablet 500 mg (has no administration in time range)  ipratropium-albuterol (DUONEB) 0.5-2.5 (3) MG/3ML nebulizer solution 3 mL (3 mLs Nebulization Given 01/21/22 0940)  ipratropium-albuterol (DUONEB) 0.5-2.5 (3) MG/3ML nebulizer solution 3 mL (3 mLs Nebulization Given 01/21/22 0940)  ipratropium-albuterol (DUONEB) 0.5-2.5 (3) MG/3ML nebulizer solution 3 mL (3 mLs Nebulization Given 01/21/22 0940)  haloperidol lactate (HALDOL) injection 2 mg (2 mg Intravenous Given 01/21/22 0938)   iohexol (OMNIPAQUE) 350 MG/ML injection 75 mL (75 mLs Intravenous Contrast Given 01/21/22 1232)  heparin bolus via infusion 4,500 Units (4,500 Units Intravenous Bolus from Bag 01/21/22 1410)     IMPRESSION / MDM / ASSESSMENT AND PLAN / ED COURSE  I reviewed the triage vital signs and the nursing notes.                               This patient presents to the ED for concern of SOB, this involves an extensive number of treatment options, and is a complaint that carries with it a high risk of complications and morbidity.  The differential diagnosis includes recurrent pneumonia (HCAP), PE, CHF, ACS, COVID-19/URI, anemia, other occult infection such as UTI, AKI    Co  morbidities that complicate the patient evaluation  Dementia Hypothyroidism Recent hospitalization for PNA   Additional history obtained:  Additional history obtained from EMS, husband External records from outside source obtained and reviewed including prior D/c summary    Lab Tests:  I Ordered, and personally interpreted labs.  The pertinent results include:   CBC with WBC 12.6, normal Hgb CMP unremarkable BNP normal Trop normal LA normal D-Dimer 2.2 COVID negative   Imaging Studies ordered:  I ordered imaging studies including CT Angio, CXR  I independently visualized and interpreted imaging which showed: CT Angio: New PE RLL and LUL CXR: Chronic lung changes I agree with the radiologist interpretation   Cardiac Monitoring: / EKG:  The patient was maintained on a cardiac monitor.  I personally viewed and interpreted the cardiac monitored which showed an underlying rhythm of: NSR   Problem List / ED Course / Critical interventions / Medication management  SOB Likely multifactorial 2/2 COPD, recent PNA, ? PE. EKG shows no ischemia, trop negative. CXR as above. CT angio pending. No signs of significant PNA or sepsis. Admit to medicine for increased WOB, persistent wheezing, and SOB.   Test /  Admission - Considered:  Admit to Hospitalist service   FINAL CLINICAL IMPRESSION(S) / ED DIAGNOSES   Final diagnoses:  COPD exacerbation ([place])  Shortness of breath     Rx / DC Orders   ED Discharge Orders     None        Note:  This document was prepared using Dragon voice recognition software and may include unintentional dictation errors.   Duffy Bruce, MD 01/21/22 1940

## 2022-01-21 NOTE — ED Notes (Addendum)
This RN went into the room to answer the call light. Pt is sleeping comfortably and pt daughter and one other person are at the bedside. Pt daughter stated that she has called out multiple times to get water for the pt and no one answered or came by so she went into the lobby to get the water for herself. I informed the daughter that the pt is NPO and she should not have had anything to drink or eat. Pt daughter also stated that she is not happy with how long her mother is waiting for a bed and I let her know that we have a total of 25 pts also waiting for beds currently and that it will take time to get the bed. I asked that they be patient with [place] and that we are working hard to take care of her mother.

## 2022-01-21 NOTE — Consult Note (Signed)
ANTICOAGULATION CONSULT NOTE - Initial Consult  Pharmacy Consult for heparin infusion  Indication: pulmonary embolus  Allergies  Allergen Reactions   Duloxetine Other (See Comments)    confusion    Patient Measurements: Height: '5\' 4"'$  (162.6 cm) Weight: 65.4 kg (144 lb 2.9 oz) IBW/kg (Calculated) : 54.7 Heparin Dosing Weight: 65.4kg  Vital Signs: Temp: 97.8 F (36.6 C) (11/29 0822) Temp Source: Oral (11/29 0822) BP: 124/86 (11/29 0930) Pulse Rate: 87 (11/29 0930)  Labs: Recent Labs    01/21/22 1101  HGB 12.7  HCT 41.1  PLT 354  CREATININE 0.80  TROPONINIHS 6    Estimated Creatinine Clearance: 42.8 mL/min (by C-G formula based on SCr of 0.8 mg/dL).   Medical History: Past Medical History:  Diagnosis Date   Actinic keratosis 09/11/2015   right nasal bridge   Anemia    followed by pcp and unconcerned.   Arthritis    Cancer ([place])    skin   Cough    CHRONIC   Dementia (HCC)    Depression    Diverticulosis    Dizziness    possibly cardiac related   GERD (gastroesophageal reflux disease)    H/O hiatal hernia    Hx of basal cell carcinoma 07/20/2012   L posterior neck   Hx of basal cell carcinoma 12/07/2016   R proximal medial calf   Hypothyroidism    PONV (postoperative nausea and vomiting)    Shortness of breath    COUGH , magnesium helps this   Squamous cell carcinoma of skin 08/16/2008   R lower leg, excised 10/10/2008   Wheezing     Medications:  PTA: Historically on apixaban for RLE DVT in June. However patient reported no longer taking early November 2023. Inpatient: Heparin infusion (11/29 >>) Allergies: No AC/APT related allergies    Assessment: 88 year old female with history of DVT (no longer on Eliquis), GERD, and hemorrhoids with reported rectal bleeding. CTA positive for new acute pulmonary emboli in the right lower lobe and left upper lobe segmental pulmonary arteries. Pharmacy consulted for management of heparin infusion in the  setting of pulmonary embolism.   Date Time aPTT/HL Rate/Comment   Goal of Therapy:  Heparin level 0.3-0.7 units/ml Monitor platelets by anticoagulation protocol: Yes   Plan:  Order STAT aPTT and anti-xa level Give 4500 units bolus x1; then start heparin infusion at 1200 units/hr Check anti-Xa level in 8 hours and daily once consecutively therapeutic. Continue to monitor H&H and platelets daily while on heparin gtt.   Darrick Penna 01/21/2022,1:18 PM

## 2022-01-21 NOTE — ED Triage Notes (Signed)
Pt to ED via [facility] from home for cough and shortness of breath. Pt was discharged on 11/17 for respiratory issues. Pt was sent home with DuoNeb treatments. Husband reports that last night was coughing a lot. Per EMS report pt had wheezing in her RUL, pt was given 1 Albuterol treatment in route. Pt has hx/o Dementia, pt husband states that pt is at her baseline.

## 2022-01-21 NOTE — ED Notes (Signed)
Assisted pt to restroom with Caitlyn, EDT. Pt placed back on card monitor. No other needs voiced at this time. Daughter remains at bedside.

## 2022-01-21 NOTE — Consult Note (Addendum)
ANTICOAGULATION CONSULT NOTE  Pharmacy Consult for heparin infusion  Indication: pulmonary embolus  Allergies  Allergen Reactions   Duloxetine Other (See Comments)    confusion    Patient Measurements: Height: '5\' 4"'$  (162.6 cm) Weight: 65.4 kg (144 lb 2.9 oz) IBW/kg (Calculated) : 54.7 Heparin Dosing Weight: 65.4kg  Vital Signs: Temp: 98.5 F (36.9 C) (11/29 1629) Temp Source: Oral (11/29 1629) BP: 112/60 (11/29 2230) Pulse Rate: 75 (11/29 2230)  Labs: Recent Labs    01/21/22 1101 01/21/22 1417 01/21/22 2157  HGB 12.7  --   --   HCT 41.1  --   --   PLT 354  --   --   APTT  --  >200*  --   HEPARINUNFRC  --  >1.10* >1.10*  CREATININE 0.80  --   --   TROPONINIHS 6  --   --      Estimated Creatinine Clearance: 42.8 mL/min (by C-G formula based on SCr of 0.8 mg/dL).   Medical History: Past Medical History:  Diagnosis Date   Actinic keratosis 09/11/2015   right nasal bridge   Anemia    followed by pcp and unconcerned.   Arthritis    Cancer ([place])    skin   Cough    CHRONIC   Dementia (HCC)    Depression    Diverticulosis    Dizziness    possibly cardiac related   GERD (gastroesophageal reflux disease)    H/O hiatal hernia    Hx of basal cell carcinoma 07/20/2012   L posterior neck   Hx of basal cell carcinoma 12/07/2016   R proximal medial calf   Hypothyroidism    PONV (postoperative nausea and vomiting)    Shortness of breath    COUGH , magnesium helps this   Squamous cell carcinoma of skin 08/16/2008   R lower leg, excised 10/10/2008   Wheezing     Medications:  PTA: Historically on apixaban for RLE DVT in June. However patient reported no longer taking early November 2023. Inpatient: Heparin infusion (11/29 >>) Allergies: No AC/APT related allergies    Assessment: 88 year old female with history of DVT (no longer on Eliquis), GERD, and hemorrhoids with reported rectal bleeding. CTA positive for new acute pulmonary emboli in the right lower  lobe and left upper lobe segmental pulmonary arteries. Pharmacy consulted for management of heparin infusion in the setting of pulmonary embolism.   Date Time aPTT/HL Rate/Comment 11/29 1417 >200 / >1.1 BL drawn after bolus 11/29 2157 >200 / >1.1 supratherapeutic  Goal of Therapy:  Heparin level 0.3-0.7 units/ml Monitor platelets by anticoagulation protocol: Yes   Plan:  Contacted RN.  Hold infusion for 1 hour Restart heparin infusion at 1000 units/hr Due to elevated BL labs drawn after bolus given and past hx of DVT on Eliquis, will continue to recheck aPTT until correlation w/ HL confirmed Recheck aPTT/HL in 8 hr after restart. CBC daily while on heparin  Renda Rolls, PharmD, [facility] 01/21/2022 11:57 PM

## 2022-01-22 ENCOUNTER — Encounter: Payer: Self-pay | Admitting: Internal Medicine

## 2022-01-22 DIAGNOSIS — J690 Pneumonitis due to inhalation of food and vomit: Secondary | ICD-10-CM

## 2022-01-22 DIAGNOSIS — I2699 Other pulmonary embolism without acute cor pulmonale: Secondary | ICD-10-CM

## 2022-01-22 DIAGNOSIS — I82411 Acute embolism and thrombosis of right femoral vein: Secondary | ICD-10-CM | POA: Diagnosis not present

## 2022-01-22 DIAGNOSIS — J121 Respiratory syncytial virus pneumonia: Secondary | ICD-10-CM | POA: Diagnosis present

## 2022-01-22 LAB — BASIC METABOLIC PANEL
Anion gap: 8 (ref 5–15)
BUN: 10 mg/dL (ref 8–23)
CO2: 26 mmol/L (ref 22–32)
Calcium: 8.8 mg/dL — ABNORMAL LOW (ref 8.9–10.3)
Chloride: 104 mmol/L (ref 98–111)
Creatinine, Ser: 0.68 mg/dL (ref 0.44–1.00)
GFR, Estimated: >60 mL/min (ref >60)
Glucose, Bld: 150 mg/dL — ABNORMAL HIGH (ref 70–99)
Potassium: 4.1 mmol/L (ref 3.5–5.1)
Sodium: 138 mmol/L (ref 135–145)

## 2022-01-22 LAB — APTT: aPTT: 166 seconds (ref 24–36)

## 2022-01-22 LAB — CBC
HCT: 37.9 % (ref 36.0–46.0)
Hemoglobin: 11.8 g/dL — ABNORMAL LOW (ref 12.0–15.0)
MCH: 28.7 pg (ref 26.0–34.0)
MCHC: 31.1 g/dL (ref 30.0–36.0)
MCV: 92.2 fL (ref 80.0–100.0)
Platelets: 328 10*3/uL (ref 150–400)
RBC: 4.11 MIL/uL (ref 3.87–5.11)
RDW: 14.1 % (ref 11.5–15.5)
WBC: 10.5 10*3/uL (ref 4.0–10.5)
nRBC: 0 % (ref 0.0–0.2)

## 2022-01-22 LAB — HEPARIN LEVEL (UNFRACTIONATED): Heparin Unfractionated: 0.92 IU/mL — ABNORMAL HIGH (ref 0.30–0.70)

## 2022-01-22 MED ORDER — HYDROCOD POLI-CHLORPHE POLI ER 10-8 MG/5ML PO SUER
5.0000 mL | Freq: Once | ORAL | Status: AC
  Administered 2022-01-22: 5 mL via ORAL
  Filled 2022-01-22: qty 5

## 2022-01-22 MED ORDER — QUETIAPINE FUMARATE 25 MG PO TABS
25.0000 mg | ORAL_TABLET | Freq: Every day | ORAL | Status: DC
  Administered 2022-01-22 – 2022-01-25 (×4): 25 mg via ORAL
  Filled 2022-01-22 (×4): qty 1

## 2022-01-22 MED ORDER — COLESTIPOL HCL 1 G PO TABS
2.0000 g | ORAL_TABLET | Freq: Two times a day (BID) | ORAL | Status: DC
  Administered 2022-01-22 – 2022-01-25 (×6): 2 g via ORAL
  Filled 2022-01-22 (×8): qty 2

## 2022-01-22 MED ORDER — HYDROCOD POLI-CHLORPHE POLI ER 10-8 MG/5ML PO SUER
5.0000 mL | Freq: Two times a day (BID) | ORAL | Status: DC
  Administered 2022-01-22 – 2022-01-23 (×3): 5 mL via ORAL
  Filled 2022-01-22 (×3): qty 5

## 2022-01-22 MED ORDER — PREDNISONE 20 MG PO TABS
40.0000 mg | ORAL_TABLET | Freq: Every day | ORAL | Status: DC
  Administered 2022-01-23 – 2022-01-25 (×3): 40 mg via ORAL
  Filled 2022-01-22 (×3): qty 2

## 2022-01-22 MED ORDER — FLUTICASONE FUROATE-VILANTEROL 200-25 MCG/ACT IN AEPB
1.0000 | INHALATION_SPRAY | Freq: Every day | RESPIRATORY_TRACT | Status: DC
  Administered 2022-01-22 – 2022-01-26 (×5): 1 via RESPIRATORY_TRACT
  Filled 2022-01-22: qty 28

## 2022-01-22 MED ORDER — APIXABAN 5 MG PO TABS: 5.0000 mg | ORAL_TABLET | Freq: Two times a day (BID) | ORAL | Status: DC

## 2022-01-22 MED ORDER — APIXABAN 5 MG PO TABS
10.0000 mg | ORAL_TABLET | Freq: Two times a day (BID) | ORAL | Status: DC
  Administered 2022-01-22 – 2022-01-25 (×8): 10 mg via ORAL
  Filled 2022-01-22 (×8): qty 2

## 2022-01-22 MED ORDER — IPRATROPIUM-ALBUTEROL 0.5-2.5 (3) MG/3ML IN SOLN
3.0000 mL | Freq: Four times a day (QID) | RESPIRATORY_TRACT | Status: DC
  Administered 2022-01-22 – 2022-01-23 (×5): 3 mL via RESPIRATORY_TRACT
  Filled 2022-01-22 (×5): qty 3

## 2022-01-22 MED ORDER — HALOPERIDOL LACTATE 5 MG/ML IJ SOLN
2.0000 mg | Freq: Four times a day (QID) | INTRAMUSCULAR | Status: DC | PRN
  Administered 2022-01-22: 2 mg via INTRAVENOUS
  Administered 2022-01-25 (×2): 1 mg via INTRAVENOUS
  Administered 2022-01-26: 2 mg via INTRAVENOUS
  Filled 2022-01-22 (×4): qty 1

## 2022-01-22 NOTE — Progress Notes (Signed)
       CROSS COVER NOTE  NAME: Brenda Goodman MRN: [id number] DOB : [birth year] ATTENDING PHYSICIAN: Ivor Costa, MD    Date of Service   01/22/2022   HPI/Events of Note   Medication request received from family for cough medicine with hydrocodone. Family reports other OTC meds are typically ineffective.  Interventions   Assessment/Plan:  Tussionex x1     This document was prepared using Dragon voice recognition software and may include unintentional dictation errors.  Neomia Glass DNP, MBA, FNP-BC Nurse Practitioner [facility] Pager [phone]

## 2022-01-22 NOTE — Consult Note (Signed)
[place] for transition of heparin infusion to apixaban Indication: pulmonary embolus  Allergies  Allergen Reactions   Duloxetine Other (See Comments)    confusion    Patient Measurements: Height: '5\' 4"'$  (162.6 cm) Weight: 65.4 kg (144 lb 2.9 oz) IBW/kg (Calculated) : 54.7 Heparin Dosing Weight: 65.4kg  Vital Signs: Temp: 98 F (36.7 C) (11/30 1018) Temp Source: Oral (11/30 0930) BP: 110/61 (11/30 1018) Pulse Rate: 87 (11/30 1018)  Labs: Recent Labs    01/21/22 1101 01/21/22 1417 01/21/22 2157 01/22/22 0508 01/22/22 0536 01/22/22 0849  HGB 12.7  --   --  11.8*  --   --   HCT 41.1  --   --  37.9  --   --   PLT 354  --   --  328  --   --   APTT  --  >200* >200*  --   --   --   HEPARINUNFRC  --  >1.10* >1.10*  --   --  0.92*  CREATININE 0.80  --   --   --  0.68  --   TROPONINIHS 6  --   --   --   --   --      Estimated Creatinine Clearance: 42.8 mL/min (by C-G formula based on SCr of 0.68 mg/dL).   Medical History: Past Medical History:  Diagnosis Date   Actinic keratosis 09/11/2015   right nasal bridge   Anemia    followed by pcp and unconcerned.   Arthritis    Cancer ([place])    skin   Cough    CHRONIC   Dementia (HCC)    Depression    Diverticulosis    Dizziness    possibly cardiac related   GERD (gastroesophageal reflux disease)    H/O hiatal hernia    Hx of basal cell carcinoma 07/20/2012   L posterior neck   Hx of basal cell carcinoma 12/07/2016   R proximal medial calf   Hypothyroidism    PONV (postoperative nausea and vomiting)    Shortness of breath    COUGH , magnesium helps this   Squamous cell carcinoma of skin 08/16/2008   R lower leg, excised 10/10/2008   Wheezing     Medications:  PTA: Historically on apixaban for RLE DVT in June. However patient reported no longer taking early November 2023. Inpatient: Heparin infusion (11/29 >>11/30) Allergies: No AC/APT related allergies     Assessment: 88 year old female with history of DVT (no longer on Eliquis), GERD, and hemorrhoids with reported rectal bleeding. CTA positive for new acute pulmonary emboli in the right lower lobe and left upper lobe segmental pulmonary arteries. Pharmacy consulted for management of heparin infusion in the setting of pulmonary embolism.   Goal of Therapy:  Heparin level 0.3-0.7 units/ml Monitor platelets by anticoagulation protocol: Yes   Plan: heparin level remains SUPRAtherapeutic despite recent rate decrease --Reduce heparin infusion rate to 850 units/hr --Due to elevated baseline labs drawn after bolus given and past hx of DVT on Eliquis, will continue to recheck aPTT until correlation w/ HL confirmed --Recheck aPTT/HL in 8 hr after restart. --CBC daily while on heparin  Vallery Sa, PharmD, BCPS 01/22/2022 10:39 AM

## 2022-01-22 NOTE — Consult Note (Signed)
[place] for transition of heparin infusion to apixaban Indication: pulmonary embolus  Allergies  Allergen Reactions   Duloxetine Other (See Comments)    confusion    Patient Measurements: Height: '5\' 4"'$  (162.6 cm) Weight: 65.4 kg (144 lb 2.9 oz) IBW/kg (Calculated) : 54.7 Heparin Dosing Weight: 65.4kg  Vital Signs: Temp: 98 F (36.7 C) (11/30 1018) Temp Source: Oral (11/30 0930) BP: 110/61 (11/30 1018) Pulse Rate: 87 (11/30 1018)  Labs: Recent Labs    01/21/22 1101 01/21/22 1417 01/21/22 2157 01/22/22 0508 01/22/22 0536 01/22/22 0849  HGB 12.7  --   --  11.8*  --   --   HCT 41.1  --   --  37.9  --   --   PLT 354  --   --  328  --   --   APTT  --  >200* >200*  --   --  166*  HEPARINUNFRC  --  >1.10* >1.10*  --   --  0.92*  CREATININE 0.80  --   --   --  0.68  --   TROPONINIHS 6  --   --   --   --   --      Estimated Creatinine Clearance: 42.8 mL/min (by C-G formula based on SCr of 0.68 mg/dL).   Medical History: Past Medical History:  Diagnosis Date   Actinic keratosis 09/11/2015   right nasal bridge   Anemia    followed by pcp and unconcerned.   Arthritis    Cancer ([place])    skin   Cough    CHRONIC   Dementia (HCC)    Depression    Diverticulosis    Dizziness    possibly cardiac related   GERD (gastroesophageal reflux disease)    H/O hiatal hernia    Hx of basal cell carcinoma 07/20/2012   L posterior neck   Hx of basal cell carcinoma 12/07/2016   R proximal medial calf   Hypothyroidism    PONV (postoperative nausea and vomiting)    Shortness of breath    COUGH , magnesium helps this   Squamous cell carcinoma of skin 08/16/2008   R lower leg, excised 10/10/2008   Wheezing     Medications:  PTA: Historically on apixaban for RLE DVT in June. However patient reported no longer taking early November 2023. Inpatient: Heparin infusion (11/29 >>11/30) Allergies: No AC/APT related allergies     Assessment: 88 year old female with history of DVT (no longer on Eliquis), GERD, and hemorrhoids with reported rectal bleeding. CTA positive for new acute pulmonary emboli in the right lower lobe and left upper lobe segmental pulmonary arteries. Pharmacy consulted for management of heparin infusion now being transitioned to apixaban in the setting of pulmonary embolism.   Goal of Therapy:  Monitor platelets by anticoagulation protocol: Yes   Plan:  --stop heparin infusion  --start apixaban 10 mg twice daily for 7 days followed by 5 mg twice daily --CBC and serum creatinine every 3 days     Vallery Sa, PharmD, BCPS 01/22/2022 1:57 PM

## 2022-01-22 NOTE — Progress Notes (Signed)
  Progress Note   Patient: Brenda Goodman SLH:[id number] DOB: 27-Sep-1932 DOA: 01/21/2022     1 DOS: the patient was seen and examined on 01/22/2022   Brief hospital course: Brenda Goodman is a 88 y.o. female with medical history significant of dementia, GERD, hypothyroidism, DVT not on anticoagulants, who presents with cough and shortness breath.  CT chest with contrast showed PE, fluid-filled large hiatal hernia. Patient also has positive RSV.  She also has some bronchospasm.  She was given steroids, Zithromax, anticoagulation.  Assessment and Plan: Acute pulm embolism. Right lower extremity DVT. Patient condition is relatively stable, currently on heparin drip.  Will switch to Eliquis.  Aspiration pneumonitis. Large hiatal hernia. Patient recently had aspiration pneumonitis, this appears to be secondary to large hiatal hernia with acid reflux. She has completed antibiotics. She has some bronchospasm, nonproductive cough.  Will continue prednisone, add Breo and the bronchodilator. Also add Tussionex for nonproductive cough. Patient is not on any ACE inhibitor causing severe cough.  Hypothyroidism.  Continue home medicine  Depression. Continue Lexapro.      Subjective:  Patient complaining significant cough, nonproductive.  Some short of breath associated with cough. No abdominal pain or nausea vomiting.  Physical Exam: Vitals:   01/22/22 0900 01/22/22 0930 01/22/22 1018 01/22/22 1235  BP: 126/75 111/65 110/61   Pulse: 77 80 87   Resp: '15 16 20   '$ Temp:  98.7 F (37.1 C) 98 F (36.7 C)   TempSrc:  Oral    SpO2: 98% 98% 94% 98%  Weight:      Height:       General exam: Appears calm and comfortable  Respiratory system: Decreased breathing sounds with some wheezes. Respiratory effort normal. Cardiovascular system: S1 & S2 heard, RRR. No JVD, murmurs, rubs, gallops or clicks. No pedal edema. Gastrointestinal system: Abdomen is nondistended, soft and nontender. No  organomegaly or masses felt. Normal bowel sounds heard. Central nervous system: Alert and oriented x2.  No focal neurological deficits. Extremities: Symmetric 5 x 5 power. Skin: No rashes, lesions or ulcers Psychiatry: Mood & affect appropriate.   Data Reviewed:  Reviewed CT scan results, chest x-ray results, all lab results.  Family Communication: Husband updated bedside, daughter updated over the phone.  Disposition: Status is: Inpatient Remains inpatient appropriate because: Severity of disease.  Planned Discharge Destination: Home with Home Health    Time spent: 55 minutes  Author: Sharen Hones, MD 01/22/2022 2:07 PM  For on call review [email or web address].

## 2022-01-22 NOTE — Hospital Course (Addendum)
Brenda Goodman is a 88 y.o. female with medical history significant of dementia, GERD, hypothyroidism, DVT not on anticoagulants, who presents with cough and shortness breath.  CT chest with contrast showed PE, fluid-filled large hiatal hernia. Patient also has positive RSV.  She also has some bronchospasm.  She was given steroids, Zithromax, anticoagulation.  12/3: Head CT negative last night.  UA concerning for UTI.  Empiric Keflex 12/4: Hospice evaluation per family request

## 2022-01-23 ENCOUNTER — Institutional Professional Consult (permissible substitution): Payer: PPO | Admitting: Internal Medicine

## 2022-01-23 DIAGNOSIS — J121 Respiratory syncytial virus pneumonia: Secondary | ICD-10-CM | POA: Diagnosis not present

## 2022-01-23 DIAGNOSIS — I82411 Acute embolism and thrombosis of right femoral vein: Secondary | ICD-10-CM | POA: Diagnosis not present

## 2022-01-23 DIAGNOSIS — I2699 Other pulmonary embolism without acute cor pulmonale: Secondary | ICD-10-CM | POA: Diagnosis not present

## 2022-01-23 LAB — CBC
HCT: 34.9 % — ABNORMAL LOW (ref 36.0–46.0)
Hemoglobin: 10.9 g/dL — ABNORMAL LOW (ref 12.0–15.0)
MCH: 28.8 pg (ref 26.0–34.0)
MCHC: 31.2 g/dL (ref 30.0–36.0)
MCV: 92.1 fL (ref 80.0–100.0)
Platelets: 344 10*3/uL (ref 150–400)
RBC: 3.79 MIL/uL — ABNORMAL LOW (ref 3.87–5.11)
RDW: 14.1 % (ref 11.5–15.5)
WBC: 15.1 10*3/uL — ABNORMAL HIGH (ref 4.0–10.5)
nRBC: 0 % (ref 0.0–0.2)

## 2022-01-23 LAB — MAGNESIUM: Magnesium: 1.9 mg/dL (ref 1.7–2.4)

## 2022-01-23 LAB — BASIC METABOLIC PANEL
Anion gap: 8 (ref 5–15)
BUN: 20 mg/dL (ref 8–23)
CO2: 26 mmol/L (ref 22–32)
Calcium: 9.1 mg/dL (ref 8.9–10.3)
Chloride: 101 mmol/L (ref 98–111)
Creatinine, Ser: 0.88 mg/dL (ref 0.44–1.00)
GFR, Estimated: >60 mL/min (ref >60)
Glucose, Bld: 131 mg/dL — ABNORMAL HIGH (ref 70–99)
Potassium: 4.4 mmol/L (ref 3.5–5.1)
Sodium: 135 mmol/L (ref 135–145)

## 2022-01-23 MED ORDER — IPRATROPIUM-ALBUTEROL 0.5-2.5 (3) MG/3ML IN SOLN
3.0000 mL | Freq: Three times a day (TID) | RESPIRATORY_TRACT | Status: DC
  Administered 2022-01-24 – 2022-01-27 (×10): 3 mL via RESPIRATORY_TRACT
  Filled 2022-01-23 (×10): qty 3

## 2022-01-23 MED ORDER — HYDROCOD POLI-CHLORPHE POLI ER 10-8 MG/5ML PO SUER
5.0000 mL | Freq: Two times a day (BID) | ORAL | Status: DC | PRN
  Administered 2022-01-24 – 2022-01-26 (×2): 5 mL via ORAL
  Filled 2022-01-23 (×2): qty 5

## 2022-01-23 MED ORDER — APIXABAN 5 MG PO TABS: 5.0000 mg | ORAL_TABLET | Freq: Two times a day (BID) | ORAL | 0 refills | Status: DC

## 2022-01-23 MED ORDER — APIXABAN 5 MG PO TABS: 10.0000 mg | ORAL_TABLET | Freq: Two times a day (BID) | ORAL | 0 refills | Status: DC

## 2022-01-23 MED ORDER — HYDROCOD POLI-CHLORPHE POLI ER 10-8 MG/5ML PO SUER: 2.5000 mL | Freq: Two times a day (BID) | ORAL | 0 refills | Status: DC | PRN

## 2022-01-23 MED ORDER — AZITHROMYCIN 250 MG PO TABS: 250.0000 mg | ORAL_TABLET | Freq: Every day | ORAL | 0 refills | Status: DC

## 2022-01-23 MED ORDER — FLUTICASONE FUROATE-VILANTEROL 200-25 MCG/ACT IN AEPB: 1.0000 | INHALATION_SPRAY | Freq: Every day | RESPIRATORY_TRACT | 0 refills | Status: DC

## 2022-01-23 MED ORDER — PREDNISONE 20 MG PO TABS: ORAL_TABLET | ORAL | 0 refills | Status: DC

## 2022-01-23 NOTE — Evaluation (Signed)
Physical Therapy Evaluation Patient Details Name: Brenda Goodman MRN: [id number] DOB: Nov 22, 1932 Today's Date: 01/23/2022  History of Present Illness  Pt is an 88 y.o. female presenting to hospital 01/21/22 with c/o SOB and cough.  Recent hospitalization for PNA.  Pt admitted with acute PE and R LE DVT and recent aspiration pneumonitis.  Also (+) RSV.  PMH includes dementia, GERD, hypothyroidism, DVT, and hiatal hernia.  Clinical Impression  Prior to hospital admission, pt was ambulatory short distances with walker with assist in home; lives with her husband (steps to enter home); has intermittent caregiver assist.  Pt initially appearing anxious but after pt's daughter arrived, pt became emotionally labile as well (always requiring tissues in both her hands--common for pt per pt's daughter).  Currently pt is mod assist to stand up to walker; min to mod assist to attempt taking steps with walker use (pt able to move L LE a little but required assist for weight shifting and to maintain upright balance).  Pt with R lean in sitting and R lateral and posterior lean in standing--family reporting this was not typical for pt--pt's nurse and MD notified.  Pt would benefit from skilled PT to address noted impairments and functional limitations (see below for any additional details).  Discussed SNF vs HHPT and 24/7 assist with pt's husband and pt's daughter to discuss options (pt's family do not appear comfortable with pt's current assist levels required for home discharge).  Upon hospital discharge, pt would benefit from SNF.       Recommendations for follow up therapy are one component of a multi-disciplinary discharge planning process, led by the attending physician.  Recommendations may be updated based on patient status, additional functional criteria and insurance authorization.  Follow Up Recommendations Skilled nursing-short term rehab (<3 hours/day) Can patient physically be transported by private  vehicle: No    Assistance Recommended at Discharge Frequent or constant Supervision/Assistance  Patient can return home with the following  Two people to help with walking and/or transfers;Two people to help with bathing/dressing/bathroom;Assistance with cooking/housework;Assist for transportation;Help with stairs or ramp for entrance    Equipment Recommendations Rolling walker (2 wheels)  Recommendations for Other Services  OT consult    Functional Status Assessment Patient has had a recent decline in their functional status and demonstrates the ability to make significant improvements in function in a reasonable and predictable amount of time.     Precautions / Restrictions Precautions Precautions: Fall Precaution Comments: chronic dizziness; hiatal hernia Restrictions Weight Bearing Restrictions: No      Mobility  Bed Mobility Overal bed mobility: Needs Assistance Bed Mobility: Sit to Supine       Sit to supine: +2 for physical assistance   General bed mobility comments: 2 assist sit to semi-supine in bed and to boost up in bed using bed sheet    Transfers Overall transfer level: Needs assistance Equipment used: Rolling walker (2 wheels) Transfers: Sit to/from Stand Sit to Stand: Mod assist           General transfer comment: assist to stand up to walker; vc's for UE/LE placement    Ambulation/Gait Ambulation/Gait assistance: Min assist, Mod assist Gait Distance (Feet):  (pt able to move L LE short distance but unable to take any other steps) Assistive device: Rolling walker (2 wheels)         General Gait Details: pt with R lateral and posterior lean requiring consistent assist for balance  Stairs  Wheelchair Mobility    Modified Rankin (Stroke Patients Only)       Balance Overall balance assessment: Needs assistance Sitting-balance support: Bilateral upper extremity supported, Feet supported Sitting balance-Leahy Scale:  Poor Sitting balance - Comments: pt with R lean in sitting requiring intermittent min assist (otherwise close SBA to CGA)   Standing balance support: Bilateral upper extremity supported, Reliant on assistive device for balance Standing balance-Leahy Scale: Poor Standing balance comment: pt with R lateral and posterior lean requiring consistent assist for balance                             Pertinent Vitals/Pain Pain Assessment Pain Assessment: Faces Faces Pain Scale: No hurt Pain Intervention(s): Limited activity within patient's tolerance, Monitored during session, Repositioned Vitals (HR and O2 on room air) stable and WFL throughout treatment session.    Home Living Family/patient expects to be discharged to:: Private residence Living Arrangements: Spouse/significant other Available Help at Discharge: Family;Available 24 hours/day;Personal care attendant (pt's husband unable to physically assist pt but is present when caregiver is not present) Type of Home: House Home Access: Stairs to enter Entrance Stairs-Rails: Right Entrance Stairs-Number of Steps: 4   Home Layout: One level Home Equipment: [place] - single point;Tub bench;Rolling Walker (2 wheels);BSC/3in1;Other (comment) (foldable fww) Additional Comments: Has caregiver assist    Prior Function Prior Level of Function : Needs assist  Cognitive Assist : Mobility (cognitive);ADLs (cognitive)           Mobility Comments: Ambulates with RW short household distances (pt CGA for ambulation). ADLs Comments: Assist for ADL's and IADL's d/t cognitive status.     Hand Dominance        Extremity/Trunk Assessment   Upper Extremity Assessment Upper Extremity Assessment: Generalized weakness;Difficult to assess due to impaired cognition (fair B hand grip strength; at least 3/5 AROM elbow flexion)    Lower Extremity Assessment Lower Extremity Assessment: Generalized weakness;Difficult to assess due to impaired  cognition (at least 3/5 AROM hip flexion, knee flexion/extension, and DF/PF B)    Cervical / Trunk Assessment Cervical / Trunk Assessment: Kyphotic  Communication   Communication: No difficulties  Cognition Arousal/Alertness: Awake/alert Behavior During Therapy:  (emotionally labile) Overall Cognitive Status: History of cognitive impairments - at baseline Area of Impairment: Orientation, Attention, Memory, Following commands, Safety/judgement, Awareness, Problem solving                 Orientation Level: Disoriented to, Time, Situation Current Attention Level: Sustained Memory: Decreased short-term memory Following Commands: Follows one step commands with increased time, Follows one step commands inconsistently Safety/Judgement: Decreased awareness of deficits, Decreased awareness of safety Awareness: Intellectual Problem Solving: Slow processing, Decreased initiation, Difficulty sequencing, Requires verbal cues, Requires tactile cues          General Comments  Nursing cleared pt for participation in physical therapy.  Pt agreeable to PT session.  Pt just finished toileting on [facility] upon PT arrival.    Exercises     Assessment/Plan    PT Assessment Patient needs continued PT services  PT Problem List Decreased strength;Decreased activity tolerance;Decreased balance;Decreased mobility;Decreased knowledge of precautions       PT Treatment Interventions DME instruction;Gait training;Stair training;Functional mobility training;Therapeutic activities;Therapeutic exercise;Balance training;Patient/family education    PT Goals (Current goals can be found in the Care Plan section)  Acute Rehab PT Goals Patient Stated Goal: to improve mobility PT Goal Formulation: With patient/family Time For Goal Achievement: 02/06/22 Potential  to Achieve Goals: Fair    Frequency Min 2X/week     Co-evaluation               AM-PAC PT "6 Clicks" Mobility  Outcome Measure Help  needed turning from your back to your side while in a flat bed without using bedrails?: A Little Help needed moving from lying on your back to sitting on the side of a flat bed without using bedrails?: A Lot Help needed moving to and from a bed to a chair (including a wheelchair)?: A Lot Help needed standing up from a chair using your arms (e.g., wheelchair or bedside chair)?: A Lot Help needed to walk in hospital room?: Total Help needed climbing 3-5 steps with a railing? : Total 6 Click Score: 11    End of Session Equipment Utilized During Treatment: Gait belt Activity Tolerance: Patient limited by fatigue Patient left: in bed;with call bell/phone within reach;with bed alarm set;with family/visitor present;Other (comment) (B heels floating via pillow support) Nurse Communication: Mobility status;Precautions;Other (comment) (pt's R lean in sitting/standing) PT Visit Diagnosis: Unsteadiness on feet (R26.81);Difficulty in walking, not elsewhere classified (R26.2);Muscle weakness (generalized) (M62.81);Other abnormalities of gait and mobility (R26.89)    Time: 0240-9735 PT Time Calculation (min) (ACUTE ONLY): 32 min   Charges:   PT Evaluation $PT Eval Low Complexity: 1 Low PT Treatments $Therapeutic Activity: 8-22 mins       Leitha Bleak, PT 01/23/22, 4:25 PM

## 2022-01-23 NOTE — Discharge Summary (Addendum)
Physician Discharge Summary   Patient: Brenda Goodman MRN: [id number] DOB: 1932-06-06  Admit date:     01/21/2022  Discharge date: 01/23/22  Discharge Physician: Sharen Hones   PCP: Kirk Ruths, MD   Recommendations at discharge:   Follow-up with PCP in 1 week. Refer to palliative care.  Discharge Diagnoses: Principal Problem:   Acute pulmonary embolism ([place]) Active Problems:   DVT (deep venous thrombosis) (HCC)   Aspiration pneumonitis ([facility])   Hypothyroidism   Hiatal hernia with gastroesophageal reflux   Positive D dimer   Depression   RSV (respiratory syncytial virus pneumonia)  Resolved Problems:   * No resolved hospital problems. *  Hospital Course: Brenda Goodman is a 88 y.o. female with medical history significant of dementia, GERD, hypothyroidism, DVT not on anticoagulants, who presents with cough and shortness breath.  CT chest with contrast showed PE, fluid-filled large hiatal hernia. Patient also has positive RSV.  She also has some bronchospasm.  She was given steroids, Zithromax, anticoagulation.  Assessment and Plan: Acute pulm embolism. Right lower extremity DVT. Patient condition is relatively stable, initially placed on heparin drip.  Swithced to [employer].   Aspiration pneumonitis. Large hiatal hernia. RSV pneumonia Patient recently had aspiration pneumonitis, this appears to be secondary to large hiatal hernia with acid reflux. She has completed antibiotics. She has some bronchospasm, nonproductive cough.  Will continue prednisone, add Breo and the bronchodilator. Also add Tussionex for nonproductive cough. Patient is not on any ACE inhibitor causing severe cough. Patient condition much improved today, no additional bronchospasm.  Cough essentially resolved.  Currently she is medically stable to be discharged.   Hypothyroidism.  Continue home medicine   Depression. Continue Lexapro.       Consultants: None Procedures performed:  None  Disposition: Home health Diet recommendation:  Discharge Diet Orders (From admission, onward)     Start     Ordered   01/23/22 0000  Diet - low sodium heart healthy        01/23/22 1347           Cardiac diet DISCHARGE MEDICATION: Allergies as of 01/23/2022       Reactions   Duloxetine Other (See Comments)   confusion        Medication List     TAKE these medications    acetaminophen 500 MG tablet Commonly known as: TYLENOL Take 1,000 mg by mouth every 6 (six) hours as needed for moderate pain.   albuterol 108 (90 Base) MCG/ACT inhaler Commonly known as: VENTOLIN HFA Inhale 2 puffs into the lungs every 6 (six) hours as needed for wheezing or shortness of breath.   apixaban 5 MG Tabs tablet Commonly known as: ELIQUIS Take 2 tablets (10 mg total) by mouth 2 (two) times daily for 6 days.   apixaban 5 MG Tabs tablet Commonly known as: ELIQUIS Take 1 tablet (5 mg total) by mouth 2 (two) times daily. Start taking on: January 29, 2022   azithromycin 250 MG tablet Commonly known as: ZITHROMAX Take 1 tablet (250 mg total) by mouth daily for 2 days.   benzonatate 200 MG capsule Commonly known as: TESSALON Take 1 capsule (200 mg total) by mouth 3 (three) times daily as needed for cough.   bismuth subsalicylate 213 MG chewable tablet Commonly known as: PEPTO BISMOL Chew 262 mg by mouth as needed for diarrhea or loose stools or indigestion.   CALCIUM 600 + D PO Take 1 tablet by mouth every evening.  chlorpheniramine-HYDROcodone 10-8 MG/5ML Commonly known as: TUSSIONEX Take 2.5 mLs by mouth every 12 (twelve) hours as needed for cough.   colestipol 1 g tablet Commonly known as: COLESTID Take 2 g by mouth 2 (two) times daily.   cyanocobalamin 1000 MCG/ML injection Commonly known as: VITAMIN B12 Inject 1,000 mcg into the muscle every 30 (thirty) days.   diphenhydrAMINE 25 MG tablet Commonly known as: BENADRYL Take 25 mg by mouth daily as needed for  allergies.   escitalopram 20 MG tablet Commonly known as: LEXAPRO Take 20 mg by mouth daily.   ferrous sulfate 325 (65 FE) MG tablet Take 325 mg by mouth daily with breakfast.   fluticasone furoate-vilanterol 200-25 MCG/ACT Aepb Commonly known as: BREO ELLIPTA Inhale 1 puff into the lungs daily. Start taking on: January 24, 2022   guaiFENesin-dextromethorphan 100-10 MG/5ML syrup Commonly known as: ROBITUSSIN DM Take 5 mLs by mouth every 4 (four) hours as needed for cough.   ipratropium-albuterol 0.5-2.5 (3) MG/3ML Soln Commonly known as: DUONEB Take 3 mLs by nebulization every 6 (six) hours as needed.   levothyroxine 125 MCG tablet Commonly known as: SYNTHROID Take 125 mcg by mouth daily before breakfast.   loperamide 2 MG tablet Commonly known as: IMODIUM A-D Take 2 mg by mouth 4 (four) times daily as needed for diarrhea or loose stools.   pantoprazole 40 MG tablet Commonly known as: PROTONIX Take 1 tablet (40 mg total) by mouth 2 (two) times daily.   predniSONE 20 MG tablet Commonly known as: DELTASONE Take 2 tablets (40 mg total) by mouth daily with breakfast for 2 days, THEN 1 tablet (20 mg total) daily with breakfast for 2 days, THEN 0.5 tablets (10 mg total) daily with breakfast for 2 days. Start taking on: January 24, 2022   sucralfate 1 g tablet Commonly known as: Carafate Take 1 tablet (1 g total) by mouth 4 (four) times daily -  with meals and at bedtime.   Vitamin D 50 MCG (2000 UT) tablet Take 2,000 Units by mouth daily.        Follow-up Information     Kirk Ruths, MD Follow up in 1 week(s).   Specialty: Internal Medicine Contact information: [place] [id number] [phone]                Discharge Exam: Danley Danker Weights   01/21/22 0804  Weight: 65.4 kg   General exam: Appears calm and comfortable  Respiratory system: Clear to auscultation. Respiratory effort  normal. Cardiovascular system: S1 & S2 heard, RRR. No JVD, murmurs, rubs, gallops or clicks. No pedal edema. Gastrointestinal system: Abdomen is nondistended, soft and nontender. No organomegaly or masses felt. Normal bowel sounds heard. Central nervous system: Alert and oriented x2. No focal neurological deficits. Extremities: Symmetric 5 x 5 power. Skin: No rashes, lesions or ulcers Psychiatry: Judgement and insight appear normal. Mood & affect appropriate.    Condition at discharge: good  The results of significant diagnostics from this hospitalization (including imaging, microbiology, ancillary and laboratory) are listed below for reference.   Imaging Studies: US Venous Img Lower Bilateral (DVT)  Result Date: 01/21/2022 CLINICAL DATA:  Positive D-dimer EXAM: BILATERAL LOWER EXTREMITY VENOUS DOPPLER ULTRASOUND TECHNIQUE: Gray-scale sonography with graded compression, as well as color Doppler and duplex ultrasound were performed to evaluate the lower extremity deep venous systems from the level of the common femoral vein and including the common femoral, femoral, profunda femoral, popliteal and calf  veins including the posterior tibial, peroneal and gastrocnemius veins when visible. The superficial great saphenous vein was also interrogated. Spectral Doppler was utilized to evaluate flow at rest and with distal augmentation maneuvers in the common femoral, femoral and popliteal veins. COMPARISON:  06/17/2021 FINDINGS: RIGHT LOWER EXTREMITY Common Femoral Vein: No evidence of thrombus. Normal compressibility, respiratory phasicity and response to augmentation. Saphenofemoral Junction: No evidence of thrombus. Normal compressibility and flow on color Doppler imaging. Profunda Femoral Vein: No evidence of thrombus. Normal compressibility and flow on color Doppler imaging. Femoral Vein: Small amount of eccentric, partially occlusive thrombus present within the distal right femoral vein. Popliteal  Vein: No evidence of thrombus. Normal compressibility, respiratory phasicity and response to augmentation. Calf Veins: No evidence of thrombus. Normal compressibility and flow on color Doppler imaging. Superficial Great Saphenous Vein: No evidence of thrombus. Normal compressibility. Venous Reflux:  None. Other Findings:  None. LEFT LOWER EXTREMITY Common Femoral Vein: No evidence of thrombus. Normal compressibility, respiratory phasicity and response to augmentation. Saphenofemoral Junction: No evidence of thrombus. Normal compressibility and flow on color Doppler imaging. Profunda Femoral Vein: No evidence of thrombus. Normal compressibility and flow on color Doppler imaging. Femoral Vein: No evidence of thrombus. Normal compressibility, respiratory phasicity and response to augmentation. Popliteal Vein: No evidence of thrombus. Normal compressibility, respiratory phasicity and response to augmentation. Calf Veins: No evidence of thrombus. Normal compressibility and flow on color Doppler imaging. Superficial Great Saphenous Vein: No evidence of thrombus. Normal compressibility. Venous Reflux:  None. Other Findings:  None. IMPRESSION: 1. Chronic, partially occlusive thrombus present within the distal right femoral vein. 2. No evidence of deep venous thrombosis in the left lower extremity. Electronically Signed   By: Miachel Roux M.D.   On: 01/21/2022 13:56   CT Angio Chest PE W and/or Wo Contrast  Result Date: 01/21/2022 CLINICAL DATA:  PE Suspected. EXAM: CT ANGIOGRAPHY CHEST WITH CONTRAST TECHNIQUE: Multidetector CT imaging of the chest was performed using the standard protocol during bolus administration of intravenous contrast. Multiplanar CT image reconstructions and MIPs were obtained to evaluate the vascular anatomy. RADIATION DOSE REDUCTION: This exam was performed according to the departmental dose-optimization program which includes automated exposure control, adjustment of the mA and/or kV according  to patient size and/or use of iterative reconstruction technique. CONTRAST:  53m OMNIPAQUE IOHEXOL 350 MG/ML SOLN COMPARISON:  CTA Chest 01/07/22 FINDINGS: Cardiovascular: Satisfactory opacification of the pulmonary arteries to the segmental level. New acute pulmonary emboli in the right lower lobe segmental pulmonary arteries (series 4, image 69), left upper lobe segmental pulmonary artery (series 4, image 63). There is an apparent focal filling defect in the LAD (series 4, image 62) which is most likely artifactual, but further evaluation with the EKG is recommended. Mediastinum/Nodes: No enlarged mediastinal, hilar, or axillary lymph nodes. The thyroid is poorly visualized. Redemonstrated is a large hiatal hernia which is fluid-filled. The esophagus superior to this level is patulous. Trachea has a crescentic configuration, likely due to expiratory phase of imaging. The right mainstem bronchus also has a similar configuration, but appears markedly narrowed, which could suggest a component of tracheobronchomalacia. There is debris within the bilateral lower lobe bronchi, which could suggest component of chronic aspiration. Lungs/Pleura: No pleural effusion. No pneumothorax. No large consolidative opacity is visualized. Assessed event for the presence of small pulmonary nodules is slightly limited due to the degree of respiratory motion artifact. There is right basilar atelectasis. Upper Abdomen: Status post cholecystectomy. There is likely a small punctate right-sided renal  stone. No hydronephrosis. Musculoskeletal: Right shoulder arthroplasty. Likely healing chronic posterior right sixth rib fracture. Redemonstrated exaggerated thoracic kyphosis. Partially imaged screw and plate right clavicular fixation hardware. 1 partial opacification of the left maxillary sinus. Review of the MIP images confirms the above findings. IMPRESSION: 1. New acute pulmonary emboli in the right lower lobe and left upper lobe  segmental pulmonary arteries. RV/LV ratio <1. 2. Apparent focal filling defect in the LAD is most likely artifactual, but further evaluation with an EKG is recommended. 3. Redemonstrated large hiatal hernia which is fluid-filled. The esophagus superior to this level is patulous. There is debris within the bilateral lower lobe bronchi, which could suggest component of chronic aspiration. 4. Crescentic configuration of the trachea and right lower lobe bronchus is most likely due to expiratory phase of imaging, but a component of tracheobronchomalacia is also a differential consideration. Electronically Signed   By: Marin Roberts M.D.   On: 01/21/2022 13:00   DG Chest Portable 1 View  Result Date: 01/21/2022 CLINICAL DATA:  Cough and shortness of breath. EXAM: PORTABLE CHEST 1 VIEW COMPARISON:  01/07/2022 FINDINGS: The cardiac silhouette, mediastinal and within limits and stable. Stable underlying chronic bronchitic type changes. No pulmonary infiltrates or pleural effusions. Stable moderate-sized hiatal hernia. The bony structures are. IMPRESSION: Chronic lung changes but no acute pulmonary findings. Electronically Signed   By: Marijo Sanes M.D.   On: 01/21/2022 08:48   ECHOCARDIOGRAM COMPLETE  Result Date: 01/08/2022    ECHOCARDIOGRAM REPORT   Patient Name:   DORETHA GODING Date of Exam: 01/07/2022 Medical Rec #:  [id number]        Height:       64.0 in Accession #:    [id number]       Weight:       146.8 lb Date of Birth:  September 24, 1932        BSA:          1.716 m Patient Age:    88 years         BP:           126/75 mmHg Patient Gender: F                HR:           68 bpm. Exam Location:  [facility] Procedure: 2D Echo, Cardiac Doppler and Color Doppler Indications:     I27.2 Pulmonary hypertension  History:         Patient has prior history of Echocardiogram examinations, most                  recent 08/31/2007. Signs/Symptoms:Shortness of Breath.  Sonographer:     Cresenciano Lick RDCS Referring Phys:   [id number] AGBATA Diagnosing Phys: Isaias Cowman MD IMPRESSIONS  1. Left ventricular ejection fraction, by estimation, is 60 to 65%. The left ventricle has normal function. The left ventricle has no regional wall motion abnormalities. Left ventricular diastolic parameters were normal.  2. Right ventricular systolic function is normal. The right ventricular size is normal.  3. The mitral valve is normal in structure. Mild mitral valve regurgitation. No evidence of mitral stenosis.  4. The aortic valve is normal in structure. Aortic valve regurgitation is mild. No aortic stenosis is present.  5. The inferior vena cava is normal in size with greater than 50% respiratory variability, suggesting right atrial pressure of 3 mmHg. FINDINGS  Left Ventricle: Left ventricular ejection fraction, by estimation, is 60 to 65%. The left ventricle  has normal function. The left ventricle has no regional wall motion abnormalities. The left ventricular internal cavity size was normal in size. There is  no left ventricular hypertrophy. Left ventricular diastolic parameters were normal. Right Ventricle: The right ventricular size is normal. No increase in right ventricular wall thickness. Right ventricular systolic function is normal. Left Atrium: Left atrial size was normal in size. Right Atrium: Right atrial size was normal in size. Pericardium: There is no evidence of pericardial effusion. Mitral Valve: The mitral valve is normal in structure. Mild mitral valve regurgitation. No evidence of mitral valve stenosis. Tricuspid Valve: The tricuspid valve is normal in structure. Tricuspid valve regurgitation is mild . No evidence of tricuspid stenosis. Aortic Valve: The aortic valve is normal in structure. Aortic valve regurgitation is mild. Aortic regurgitation PHT measures 498 msec. No aortic stenosis is present. Pulmonic Valve: The pulmonic valve was normal in structure. Pulmonic valve regurgitation is not visualized. No  evidence of pulmonic stenosis. Aorta: The aortic root is normal in size and structure. Venous: The inferior vena cava is normal in size with greater than 50% respiratory variability, suggesting right atrial pressure of 3 mmHg. IAS/Shunts: No atrial level shunt detected by color flow Doppler.  LEFT VENTRICLE PLAX 2D LVIDd:         4.50 cm   Diastology LVIDs:         2.90 cm   LV e' medial:    8.43 cm/s LV PW:         0.50 cm   LV E/e' medial:  6.1 LV IVS:        0.50 cm   LV e' lateral:   10.80 cm/s LVOT diam:     1.90 cm   LV E/e' lateral: 4.8 LV SV:         60 LV SV Index:   35 LVOT Area:     2.84 cm  RIGHT VENTRICLE             IVC RV Basal diam:  2.80 cm     IVC diam: 1.70 cm RV S prime:     10.79 cm/s TAPSE (M-mode): 1.3 cm LEFT ATRIUM             Index        RIGHT ATRIUM          Index LA diam:        3.60 cm 2.10 cm/m   RA Area:     9.73 cm LA Vol (A2C):   28.5 ml 16.61 ml/m  RA Volume:   19.70 ml 11.48 ml/m LA Vol (A4C):   50.7 ml 29.55 ml/m LA Biplane Vol: 38.7 ml 22.56 ml/m  AORTIC VALVE LVOT Vmax:   106.67 cm/s LVOT Vmean:  67.833 cm/s LVOT VTI:    0.211 m AI PHT:      498 msec  AORTA Ao Root diam: 2.70 cm MITRAL VALVE               TRICUSPID VALVE MV Area (PHT): 3.85 cm    TR Peak grad:   18.0 mmHg MV Decel Time: 197 msec    TR Vmax:        212.00 cm/s MV E velocity: 51.70 cm/s MV A velocity: 80.50 cm/s  SHUNTS MV E/A ratio:  0.64        Systemic VTI:  0.21 m  Systemic Diam: 1.90 cm Isaias Cowman MD Electronically signed by Isaias Cowman MD Signature Date/Time: 01/08/2022/12:28:03 PM    Final    CT Angio Chest PE W/Cm &/Or Wo Cm  Result Date: 01/07/2022 CLINICAL DATA:  Pulmonary embolism suspected. EXAM: CT ANGIOGRAPHY CHEST WITH CONTRAST TECHNIQUE: Multidetector CT imaging of the chest was performed using the standard protocol during bolus administration of intravenous contrast. Multiplanar CT image reconstructions and MIPs were obtained to evaluate the  vascular anatomy. RADIATION DOSE REDUCTION: This exam was performed according to the departmental dose-optimization program which includes automated exposure control, adjustment of the mA and/or kV according to patient size and/or use of iterative reconstruction technique. CONTRAST:  68m OMNIPAQUE IOHEXOL 350 MG/ML SOLN COMPARISON:  Medial patella sequela second FINDINGS: Cardiovascular: Satisfactory opacification of the pulmonary arteries to the segmental level. No evidence of pulmonary embolism. The heart is enlarged. Coronary artery atherosclerotic calcifications. Main pulmonary trunk is dilated measuring up to 3.2 cm concerning for pulmonary arterial hypertension. No pericardial effusion. Mediastinum/Nodes: No enlarged mediastinal, hilar, or axillary lymph nodes. Thyroid gland, trachea, and esophagus demonstrate no significant findings. Moderate size hiatal hernia. Lungs/Pleura: Biapical pleural/parenchymal scarring. No evidence of pneumonia or pulmonary edema. Bibasilar dependent atelectasis. Upper Abdomen: No acute abnormality. Musculoskeletal: Marked thoracic kyphoscoliosis.  No acute fracture. Review of the MIP images confirms the above findings. IMPRESSION: 1. No evidence of pulmonary embolism or other acute intrathoracic abnormality. 2. Cardiomegaly with coronary artery atherosclerotic calcifications. 3. Pulmonary trunk is dilated concerning for pulmonary arterial hypertension. 4. Moderate size hiatal hernia. 5. Marked thoracic kyphoscoliosis. Electronically Signed   By: IKeane PoliceD.O.   On: 01/07/2022 14:26   DG Chest Port 1 View  Result Date: 01/07/2022 CLINICAL DATA:  Possible sepsis.  Cough for weeks. EXAM: PORTABLE CHEST 1 VIEW COMPARISON:  12/19/2021 FINDINGS: Right shoulder arthroplasty. Remote right upper rib fracture. Patient rotated to the left. Mild cardiomegaly with tortuous descending thoracic aorta. Atherosclerosis in the transverse aorta. No pleural effusion or pneumothorax. Diffuse  interstitial thickening is nonspecific, especially in this age group. No lobar consolidation. Suspect pleuroparenchymal scarring in both apices, accentuated by obliquity and the chin overlying the left-greater-than-right apices. IMPRESSION: No evidence of pneumonia. Cardiomegaly Aortic Atherosclerosis (ICD10-I70.0). Electronically Signed   By: KAbigail MiyamotoM.D.   On: 01/07/2022 11:32    Microbiology: Results for orders placed or performed during the hospital encounter of 01/21/22  SARS Coronavirus 2 by RT PCR (hospital order[facility]) *cepheid single result test* Anterior Nasal Swab     Status: None   Collection Time: 01/21/22  8:22 AM   Specimen: Anterior Nasal Swab  Result Value Ref Range Status   SARS Coronavirus 2 by RT PCR NEGATIVE NEGATIVE Final    Comment: (NOTE) SARS-CoV-2 target nucleic acids are NOT DETECTED.  The SARS-CoV-2 RNA is generally detectable in upper and lower respiratory specimens during the acute phase of infection. The lowest concentration of SARS-CoV-2 viral copies this assay can detect is 250 copies / mL. A negative result does not preclude SARS-CoV-2 infection and should not be used as the sole basis for treatment or other patient management decisions.  A negative result may occur with improper specimen collection / handling, submission of specimen other than nasopharyngeal swab, presence of viral mutation(s) within the areas targeted by this assay, and inadequate number of viral copies (<250 copies / mL). A negative result must be combined with clinical observations, patient history, and epidemiological information.  Fact Sheet for Patients:  https://www.patel.info/  Fact Sheet for Healthcare Providers: https://hall.com/  This test is not yet approved or  cleared by the [place] FDA and has been authorized for detection and/or diagnosis of SARS-CoV-2 by FDA under an Emergency Use  Authorization (EUA).  This EUA will remain in effect (meaning this test can be used) for the duration of the COVID-19 declaration under Section 564(b)(1) of the Act, 21 U.S.C. section 360bbb-3(b)(1), unless the authorization is terminated or revoked sooner.  Performed at [facility], [address] [place], [place] [address]   Culture, blood (routine x 2) Call MD if unable to obtain prior to antibiotics being given     Status: None (Preliminary result)   Collection Time: 01/21/22 10:30 AM   Specimen: BLOOD  Result Value Ref Range Status   Specimen Description BLOOD BLOOD RIGHT FOREARM  Final   Special Requests   Final    BOTTLES DRAWN AEROBIC AND ANAEROBIC Blood Culture results may not be optimal due to an inadequate volume of blood received in culture bottles   Culture   Final    NO GROWTH 2 DAYS Performed at [facility], [address] [place], [place] [address]    Report Status PENDING  Incomplete  Blood culture (single)     Status: None (Preliminary result)   Collection Time: 01/21/22 11:11 AM   Specimen: BLOOD  Result Value Ref Range Status   Specimen Description BLOOD LEFT ANTECUBITAL  Final   Special Requests   Final    BOTTLES DRAWN AEROBIC AND ANAEROBIC Blood Culture adequate volume   Culture   Final    NO GROWTH 2 DAYS Performed at [facility], [address] [place], [place] [address]    Report Status PENDING  Incomplete  Respiratory (~20 pathogens) panel by PCR     Status: Abnormal   Collection Time: 01/21/22  2:17 PM   Specimen: Nasopharyngeal Swab; Respiratory  Result Value Ref Range Status   Adenovirus NOT DETECTED NOT DETECTED Final   Coronavirus 229E NOT DETECTED NOT DETECTED Final    Comment: (NOTE) The Coronavirus on the Respiratory Panel, DOES NOT test for the novel  Coronavirus (2019 nCoV)    Coronavirus HKU1 NOT DETECTED NOT DETECTED Final   Coronavirus NL63 NOT DETECTED NOT DETECTED Final   Coronavirus OC43 NOT  DETECTED NOT DETECTED Final   Metapneumovirus NOT DETECTED NOT DETECTED Final   Rhinovirus / Enterovirus NOT DETECTED NOT DETECTED Final   Influenza A NOT DETECTED NOT DETECTED Final   Influenza B NOT DETECTED NOT DETECTED Final   Parainfluenza Virus 1 NOT DETECTED NOT DETECTED Final   Parainfluenza Virus 2 NOT DETECTED NOT DETECTED Final   Parainfluenza Virus 3 NOT DETECTED NOT DETECTED Final   Parainfluenza Virus 4 NOT DETECTED NOT DETECTED Final   Respiratory Syncytial Virus DETECTED (A) NOT DETECTED Final   Bordetella pertussis NOT DETECTED NOT DETECTED Final   Bordetella Parapertussis NOT DETECTED NOT DETECTED Final   Chlamydophila pneumoniae NOT DETECTED NOT DETECTED Final   Mycoplasma pneumoniae NOT DETECTED NOT DETECTED Final    Comment: Performed at [facility], [place] [address], [place], [place] [address]    Labs: CBC: Recent Labs  Lab 01/21/22 1101 01/22/22 0508 01/23/22 0554  WBC 12.6* 10.5 15.1*  NEUTROABS 7.8*  --   --   HGB 12.7 11.8* 10.9*  HCT 41.1 37.9 34.9*  MCV 94.5 92.2 92.1  PLT 354 328 387   Basic Metabolic Panel: Recent Labs  Lab 01/21/22  1101 01/22/22 0536 01/23/22 0554  NA 144 138 135  K 3.6 4.1 4.4  CL 106 104 101  CO2 '30 26 26  '$ GLUCOSE 100* 150* 131*  BUN '9 10 20  '$ CREATININE 0.80 0.68 0.88  CALCIUM 9.0 8.8* 9.1  MG  --   --  1.9   Liver Function Tests: Recent Labs  Lab 01/21/22 1101  AST 21  ALT 14  ALKPHOS 84  BILITOT 0.8  PROT 7.0  ALBUMIN 3.3*   CBG: No results for input(s): "GLUCAP" in the last 168 hours.  Discharge time spent: greater than 30 minutes.  Signed: Sharen Hones, MD [facility] 01/23/2022

## 2022-01-23 NOTE — Care Management Important Message (Signed)
Important Message  Patient Details  Name: FATIHA GUZY MRN: [id number] Date of Birth: [birth year]   Medicare Important Message Given:  N/A - LOS <3 / Initial given by admissions     Dannette Barbara 01/23/2022, 10:41 AM

## 2022-01-23 NOTE — NC FL2 (Signed)
[place] LEVEL OF CARE FORM     IDENTIFICATION  Patient Name: Brenda Goodman Birthdate: Jan 03, 1933 Sex: female Admission Date (Current Location): 01/21/2022  [facility] and [place] Number:  Engineering geologist and Address:  [facility], [address] [place] [address]      Provider Number: [id number]  Attending Physician Name and Address:  Sharen Hones, MD  Relative Name and Phone Number:  [address] [place] [address]    Current Level of Care: Hospital Recommended Level of Care: [place] Prior Approval Number:    Date Approved/Denied:   PASRR Number: [id number]  Discharge Plan:      Current Diagnoses: Patient Active Problem List   Diagnosis Date Noted   RSV (respiratory syncytial virus pneumonia) 01/22/2022   Aspiration pneumonitis ([place]) 01/21/2022   Positive D dimer 01/21/2022   Acute pulmonary embolism ([place]) 01/21/2022   DVT (deep venous thrombosis) ([place]) 01/21/2022   Acute hypoxic respiratory failure ([place]) 01/07/2022   Hypothyroidism 01/07/2022   Hiatal hernia with gastroesophageal reflux 01/07/2022   Dementia ([place]) 01/07/2022   Depression 01/07/2022   S/P shoulder replacement, right 09/04/2016    Orientation RESPIRATION BLADDER Height & Weight     Self  Normal Incontinent Weight: 144 lb 2.9 oz (65.4 kg) Height:  '5\' 4"'$  (162.6 cm)  BEHAVIORAL SYMPTOMS/MOOD NEUROLOGICAL BOWEL NUTRITION STATUS      Incontinent Diet (Regular)  AMBULATORY STATUS COMMUNICATION OF NEEDS Skin   Limited Assist Verbally Bruising, Other (Comment) (dry skin)                       Personal Care Assistance Level of Assistance  Bathing, Feeding, Dressing Bathing Assistance: Limited assistance Feeding assistance: Limited assistance Dressing Assistance: Limited assistance     Functional Limitations Info             SPECIAL CARE FACTORS FREQUENCY  PT (By licensed PT), OT (By licensed OT)     PT  Frequency: 5 times per week OT Frequency: 5 times per week            Contractures      Additional Factors Info  Code Status, Allergies Code Status Info: DNR Allergies Info: Allergies: Duloxetine           Current Medications (01/23/2022):  This is the current hospital active medication list Current Facility-Administered Medications  Medication Dose Route Frequency Provider Last Rate Last Admin   acetaminophen (TYLENOL) tablet 650 mg  650 mg Oral Q6H PRN Ivor Costa, MD       albuterol (PROVENTIL) (2.5 MG/3ML) 0.083% nebulizer solution 2.5 mg  2.5 mg Nebulization Q4H PRN Ivor Costa, MD       apixaban Arne Cleveland) tablet 10 mg  10 mg Oral BID Dallie Piles, RPH   10 mg at 01/23/22 6789   Followed by   Derrill Memo ON 01/29/2022] apixaban (ELIQUIS) tablet 5 mg  5 mg Oral BID Dallie Piles, RPH       azithromycin [facility]) tablet 250 mg  250 mg Oral Daily Ivor Costa, MD   250 mg at 01/23/22 0810   calcium-vitamin D (OSCAL WITH D) 500-5 MG-MCG per tablet 1 tablet  1 tablet Oral QPM Ivor Costa, MD   1 tablet at 01/21/22 2007   chlorpheniramine-HYDROcodone (TUSSIONEX) 10-8 MG/5ML suspension 5 mL  5 mL Oral Q12H PRN Sharen Hones, MD       cholecalciferol (VITAMIN D3) 25 MCG (1000 UNIT) tablet 2,000  Units  2,000 Units Oral Daily Ivor Costa, MD   2,000 Units at 01/23/22 2023   colestipol (COLESTID) tablet 2 g  2 g Oral BID Dallie Piles, RPH   2 g at 01/23/22 3435   [START ON 02/19/2022] cyanocobalamin (VITAMIN B12) injection 1,000 mcg  1,000 mcg Intramuscular Q30 days Ivor Costa, MD       dextromethorphan-guaiFENesin [facility]) 30-600 MG per 12 hr tablet 1 tablet  1 tablet Oral BID PRN Ivor Costa, MD   1 tablet at 01/22/22 1145   escitalopram (LEXAPRO) tablet 20 mg  20 mg Oral Daily Ivor Costa, MD   20 mg at 01/23/22 6861   ferrous sulfate tablet 325 mg  325 mg Oral Q breakfast Ivor Costa, MD   325 mg at 01/23/22 0806   fluticasone furoate-vilanterol (BREO ELLIPTA) 200-25 MCG/ACT 1 puff  1  puff Inhalation Daily Sharen Hones, MD   1 puff at 01/23/22 0807   haloperidol lactate (HALDOL) injection 2 mg  2 mg Intravenous Q6H PRN Sharen Hones, MD   2 mg at 01/22/22 1518   ipratropium-albuterol (DUONEB) 0.5-2.5 (3) MG/3ML nebulizer solution 3 mL  3 mL Nebulization Q6H Sharen Hones, MD   3 mL at 01/23/22 1420   levothyroxine (SYNTHROID) tablet 125 mcg  125 mcg Oral QAC breakfast Ivor Costa, MD   125 mcg at 01/23/22 0545   loperamide (IMODIUM) capsule 2 mg  2 mg Oral QID PRN Ivor Costa, MD       ondansetron [facility]) injection 4 mg  4 mg Intravenous Q8H PRN Ivor Costa, MD       pantoprazole (PROTONIX) EC tablet 40 mg  40 mg Oral BID Ivor Costa, MD   40 mg at 01/23/22 0806   predniSONE (DELTASONE) tablet 40 mg  40 mg Oral Q breakfast Sharen Hones, MD   40 mg at 01/23/22 0806   QUEtiapine (SEROQUEL) tablet 25 mg  25 mg Oral QHS Sharen Hones, MD   25 mg at 01/22/22 2207   sucralfate (CARAFATE) tablet 1 g  1 g Oral TID WC & HS Ivor Costa, MD   1 g at 01/23/22 6837     Discharge Medications: Please see discharge summary for a list of discharge medications.  Relevant Imaging Results:  Relevant Lab Results:   Additional Information SS #: [place], LCSW

## 2022-01-23 NOTE — Plan of Care (Signed)
  Problem: Activity: Goal: Ability to tolerate increased activity will improve Outcome: Progressing   Problem: Clinical Measurements: Goal: Ability to maintain a body temperature in the normal range will improve Outcome: Progressing   Problem: Respiratory: Goal: Ability to maintain adequate ventilation will improve Outcome: Progressing Goal: Ability to maintain a clear airway will improve Outcome: Progressing   Problem: Education: Goal: Knowledge of General Education information will improve Description: Including pain rating scale, medication(s)/side effects and non-pharmacologic comfort measures Outcome: Progressing   Problem: Health Behavior/Discharge Planning: Goal: Ability to manage health-related needs will improve Outcome: Progressing

## 2022-01-23 NOTE — TOC Initial Note (Addendum)
Transition of Care [facility]) - Initial/Assessment Note    Patient Details  Name: Brenda Goodman MRN: [id number] Date of Birth: 06/11/1932  Transition of Care [facility]) CM/SW Contact:    Magnus Ivan, LCSW Phone Number: 01/23/2022, 11:48 AM  Clinical Narrative:              Patient known to [facility] for recent admission earlier this month.  Patient lives at home with her spouse. Patient has 24 hour care at home through caregiver [place].  PCP is Dr. Ouida Sills. Pharamacy is [employer]. Spouse provides transportation. Patient has a cane, tub bench, nebulizer, 3in1, and walker at home.  Patient's spouse and caregiver are agreeable to HHPT/OT recommendations. Confirmed home address.  Patient was referred to [facility] on last admission. [facility] stated family declined services after DC.  CSW spoke to [place] who stated they never heard from [facility] and they do want [place] at DC this time. Corene Cornea with [facility] was updated by CSW and is checking if they can take patient again.  Patient's family also has concerns about patient discharging today, MD notified and asked to follow up.   12:24Corene Cornea with [facility] unable to accept patient again. CSW reached out to [place] with [facility] who accepted patient for [facility] and PT.   3:35- Spoke with daughter Georgina Snell who stated family has discussed the plan after patient worked with PT this afternoon and are agreeable to SNF for short term rehab. They prefer [facility] or [place]. SNF work up started. Family stated they do not want [facility].  Expected Discharge Plan: [place] Barriers to Discharge: Continued Medical Work up   Patient Goals and CMS Choice Patient states their goals for this hospitalization and ongoing recovery are:: home with home health CMS Medicare.gov Compare Post Acute Care list provided to:: Patient Represenative (must comment) Choice offered to / list presented to : Spouse, NA  Expected Discharge Plan and  Services Expected Discharge Plan: [place]       Living arrangements for the past 2 months: Single Family Home                                      Prior Living Arrangements/Services Living arrangements for the past 2 months: Single Family Home Lives with:: Spouse Patient language and need for interpreter reviewed:: Yes Do you feel safe going back to the place where you live?: Yes      Need for Family Participation in Patient Care: Yes (Comment) Care giver support system in place?: Yes (comment) Current home services: DME, [facility] Criminal Activity/Legal Involvement Pertinent to Current Situation/Hospitalization: No - Comment as needed  Activities of Daily Living Home Assistive Devices/Equipment: Environmental consultant (specify type), Eyeglasses ADL Screening (condition at time of admission) Patient's cognitive ability adequate to safely complete daily activities?: No Is the patient deaf or have difficulty hearing?: No Does the patient have difficulty seeing, even when wearing glasses/contacts?: No Does the patient have difficulty concentrating, remembering, or making decisions?: Yes Patient able to express need for assistance with ADLs?: Yes Does the patient have difficulty dressing or bathing?: Yes Independently performs ADLs?: No Communication: Independent Dressing (OT): Needs assistance Is this a change from baseline?: Pre-admission baseline Grooming: Needs assistance Is this a change from baseline?: Pre-admission baseline Feeding: Independent Bathing: Needs assistance Is this a change from baseline?: Pre-admission baseline Toileting: Needs assistance  Is this a change from baseline?: Pre-admission baseline In/Out Bed: Needs assistance Is this a change from baseline?: Pre-admission baseline Walks in Home: Needs assistance Is this a change from baseline?: Pre-admission baseline Does the patient have difficulty walking or climbing stairs?: Yes Weakness of Legs:  Both Weakness of Arms/Hands: None  Permission Sought/Granted Permission sought to share information with : Facility Arts administrator granted to share info w AGENCY: [facility]        Emotional Assessment       Orientation: : Fluctuating Orientation (Suspected and/or reported [place]) Alcohol / Substance Use: Not Applicable Psych Involvement: No (comment)  Admission diagnosis:  Acute bronchitis [J20.9] Shortness of breath [R06.02] COPD exacerbation ([place]) [J44.1] Patient Active Problem List   Diagnosis Date Noted   RSV (respiratory syncytial virus pneumonia) 01/22/2022   Aspiration pneumonitis ([place]) 01/21/2022   Positive D dimer 01/21/2022   Acute pulmonary embolism ([place]) 01/21/2022   DVT (deep venous thrombosis) ([place]) 01/21/2022   Acute hypoxic respiratory failure ([place]) 01/07/2022   Hypothyroidism 01/07/2022   Hiatal hernia with gastroesophageal reflux 01/07/2022   Dementia ([place]) 01/07/2022   Depression 01/07/2022   S/P shoulder replacement, right 09/04/2016   PCP:  Kirk Ruths, MD Pharmacy:   [place], [place] [address] Phone: [phone] Fax: [phone]  [facility] ([place], [place] [address] [place] [address] Phone: [phone] Fax: [phone]  [place] [facility]) - [place], [place] [address] Phone: [phone] Fax: [place], [place] [address] Phone: [phone] Fax: [phone]     Social Determinants of Health (SDOH) Interventions    Readmission Risk Interventions     No data to display

## 2022-01-23 NOTE — Evaluation (Signed)
Clinical/Bedside Swallow Evaluation Patient Details  Name: Brenda Goodman MRN: [id number] Date of Birth: 04/28/1932  Today's Date: 01/23/2022 Time: SLP Start Time (ACUTE ONLY): 0830 SLP Stop Time (ACUTE ONLY): 0930 SLP Time Calculation (min) (ACUTE ONLY): 60 min  Past Medical History:  Past Medical History:  Diagnosis Date   Actinic keratosis 09/11/2015   right nasal bridge   Anemia    followed by pcp and unconcerned.   Arthritis    Cancer ([place])    skin   Cough    CHRONIC   Dementia (HCC)    Depression    Diverticulosis    Dizziness    possibly cardiac related   GERD (gastroesophageal reflux disease)    H/O hiatal hernia    Hx of basal cell carcinoma 07/20/2012   L posterior neck   Hx of basal cell carcinoma 12/07/2016   R proximal medial calf   Hypothyroidism    PONV (postoperative nausea and vomiting)    Shortness of breath    COUGH , magnesium helps this   Squamous cell carcinoma of skin 08/16/2008   R lower leg, excised 10/10/2008   Wheezing    Past Surgical History:  Past Surgical History:  Procedure Laterality Date   CARDIOVASCULAR STRESS TEST     2012   [place] EXTRACTION W/PHACO Right 02/26/2015   Procedure: CATARACT EXTRACTION PHACO AND INTRAOCULAR LENS PLACEMENT ([place]);  Surgeon: Birder Robson, MD;  Location: [facility] ORS;  Service: Ophthalmology;  Laterality: Right;  [place]  02:00 AP% 27.4 CDE 32.96 fluid pack lot # [id number]   CATARACT EXTRACTION W/PHACO Left 03/19/2015   Procedure: CATARACT EXTRACTION PHACO AND INTRAOCULAR LENS PLACEMENT ([facility]);  Surgeon: Birder Robson, MD;  Location: [facility] ORS;  Service: Ophthalmology;  Laterality: Left;  [place] 02:07 AP% 26.0 CDE 33.14 fluid pack lot # [id number]   CATARACT EXTRACTION, BILATERAL     CHOLECYSTECTOMY     EYE SURGERY     hardware in right shoulder Right 2010   ball and pin in shoulder   HERNIA REPAIR     2012   JOINT REPLACEMENT     LUMBAR LAMINECTOMY/DECOMPRESSION  MICRODISCECTOMY  05/04/2011   Procedure: LUMBAR LAMINECTOMY/DECOMPRESSION MICRODISCECTOMY 1 LEVEL;  Surgeon: Ophelia Charter, MD;  Location: [place] NEURO ORS;  Service: Neurosurgery;  Laterality: N/A;  Lumbar three and lumbar four Laminectomy   NO PAST SURGERIES     RIGHT ARM SURGERY BALL + PIN    REVERSE SHOULDER ARTHROPLASTY Right 09/04/2016   Procedure: RIGHT REVERSE TOTAL SHOULDER ARTHROPLASTY REVISION;  Surgeon: Netta Cedars, MD;  Location: [place];  Service: Orthopedics;  Laterality: Right;   THYROID LOBECTOMY  2016    APPROX 4 YRS AGO    THYROIDECTOMY     TONSILLECTOMY     TOTAL SHOULDER ARTHROPLASTY     Right   HPI:  Pt is a 88 y.o. female with medical history significant of multiple issues including Alzheimer's Dementia, Large HIATAL HERNIA, cough w/ emesis intermittently, GERD, hypothyroidism, DVT not on anticoagulants, who presents with cough and shortness breath.  Patient was recently hospitalized from 11/15 - 11/17 w/ similar complaints and acute hypoxic respiratory failure with unclear etiology.  Pt does have a Large HIATAL HERNIA which significantly increases risk for Regurgitation and aspiration of REFLUX material.   CT Chest: New acute pulmonary emboli in the right lower lobe and left upper  lobe segmental pulmonary arteries. RV/LV ratio <1.  2. Apparent focal filling defect in the  LAD is most likely  artifactual, but further evaluation with an EKG is recommended.  3. Redemonstrated large hiatal hernia which is fluid-filled. The  esophagus superior to this level is patulous. There is debris within  the bilateral lower lobe bronchi, which could suggest component of  chronic aspiration.  4. Crescentic configuration of the trachea and right lower lobe  bronchus is most likely due to expiratory phase of imaging, but a  component of tracheobronchomalacia is also a differential  consideration.    Assessment / Plan / Recommendation  Clinical Impression   Pt seen for BSE this morning. Pt  alerted, verbally responded and pleasant. Noted eyes closed often and needed cues to engage in activities. Pt has Baseline Dementia. Husband and Caregiver present in room.  Pt on RA, afebrile.  Pt appears to present w/ grossly adequate oropharyngeal phase swallow function in setting of declined Cognitive status - Baseline Dementia AND Esophageal phase Dysmotility - a LARGE HIATAL HERNIA. ANY Cognitive decline can impact her overall awareness/timing of swallow and safety during po tasks which increases risk for aspiration, choking. ANY Dysmotility or Regurgitation of Reflux material from the Esophagus can increase risk for aspiration of the Reflux material during Retrograde backflow thus impact Voicing and Pulmonary status. She also requires feeding support and min-mod verbal/visual/tactile cues for follow through during po tasks and self-feeding which can also impact safety during oral intake. Pt's risk for REFLUX aspiration and oropharyngeal phase aspiration can be reduced when following general aspiration precautions, STRICT REFLUX PRECAUTIONS, feeding support, and when using a modified diet consistency of broken down foods, moistened foods w/ TIME b/t bites/sips to allow for clearing.        Pt demonstrated No overt oropharyngeal phase dysphagia w/ po trials, breakfast meal; No neuromuscular swallowing deficits appreciated. Pt consumed trials of thin liquids via straw and soft solids w/ no overt clinical s/s of aspiration noted; clear vocal quality b/t trials, no decline in pulmonary status, no multiple swallows noted post initial pharyngeal swallow. Oral phase appeared [facility] for bolus management, mastication, and timely A-P transfer/clearing of material. Moistened foods given. OM exam was [facility] for oral clearing; lingual/labial movements. No unilateral weakness. Speech clear. Pt fed self w/ MOD support - caregiver present stated same at home.   Recommend continue a more Mech soft consistency diet for ease of  choice of manageable foods as tolerates(w/ less tough meats); well-moistened foods. Thin liquids w/ less straw use d/t air swallowing; general aspiration precautions. STRICT REFLUX precautions strongly recommended to lessen chance for Regurgitation. Rest Breaks during meals/oral intake to allow for Esophageal clearing. Support w/ feeding at all meals. Pills in puree. Recommend pt continue f/u w/ GI for education/management of Reflux and Esophageal Dysmotility d/t Large Hiatal Hernia and tx as indicated. Disucssion and handouts given on REflux to Husband and caregiver. Recommended Palliative Care services at D/C to discuss pt's Chronic dxs in the home; [place]. MD to reconsult ST services if any new needs while admitted. SLP Visit Diagnosis: Dysphagia, oral phase (R13.11) (min w/ solids; Dementia; larger Hiatal hernia)    Aspiration Risk   (reduced following general precautions)    Diet Recommendation   a more Mech soft consistency diet for ease of choice of manageable foods as tolerates(w/ less tough meats); well-moistened foods. Thin liquids w/ less straw use d/t air swallowing; general aspiration precautions. STRICT REFLUX precautions strongly recommended to lessen chance for Regurgitation. Rest Breaks during meals/oral intake to allow for Esophageal clearing. Support w/ feeding at all  meals.  Medication Administration: Whole meds with puree (vs need to CRUSH in Puree for ease of clearing)    Other  Recommendations Recommended Consults: Consider GI evaluation;Consider esophageal assessment (for better understanding/education of Large HIATAL HERNIA impact; Palliative Care consult for [place]) Oral Care Recommendations: Oral care BID;Oral care before and after PO;Staff/trained caregiver to provide oral care Other Recommendations:  (n/a)    Recommendations for follow up therapy are one component of a multi-disciplinary discharge planning process, led by the attending physician.  Recommendations may be updated  based on patient status, additional functional criteria and insurance authorization.  Follow up Recommendations No SLP follow up      Assistance Recommended at Discharge  No skilled ST services indicated. Full assistance by Family/caregiver in the home d/t baseline Dementia.  Functional Status Assessment Patient has had a recent decline in their functional status and/or demonstrates limited ability to make significant improvements in function in a reasonable and predictable amount of time  Frequency and Duration  (n/a)   (n/a)       Prognosis Prognosis for Safe Diet Advancement: [place] Barriers to Reach Goals: Cognitive deficits;Language deficits;Time post onset;Severity of deficits (Large HIATAL HERNIA impact) Barriers/Prognosis Comment: Large HIATAL HERNIA impact; Dementia impact      Swallow Study   General Date of Onset: 01/21/22 HPI: Pt is a 87 y.o. female with medical history significant of multiple issues including Alzheimer's Dementia, Large HIATAL HERNIA, cough w/ emesis intermittently, GERD, hypothyroidism, DVT not on anticoagulants, who presents with cough and shortness breath.  Patient was recently hospitalized from 11/15 - 11/17 w/ similar complaints and acute hypoxic respiratory failure with unclear etiology.  Pt does have a Large HIATAL HERNIA which significantly increases risk for Regurgitation and aspiration of REFLUX material.   CT Chest: New acute pulmonary emboli in the right lower lobe and left upper  lobe segmental pulmonary arteries. RV/LV ratio <1.  2. Apparent focal filling defect in the LAD is most likely  artifactual, but further evaluation with an EKG is recommended.  3. Redemonstrated large hiatal hernia which is fluid-filled. The  esophagus superior to this level is patulous. There is debris within  the bilateral lower lobe bronchi, which could suggest component of  chronic aspiration.  4. Crescentic configuration of the trachea and right lower lobe  bronchus is most  likely due to expiratory phase of imaging, but a  component of tracheobronchomalacia is also a differential  consideration. Type of Study: Bedside Swallow Evaluation Previous Swallow Assessment: 12/2021 Diet Prior to this Study: Dysphagia 2 (chopped);Thin liquids Temperature Spikes Noted: No Respiratory Status: Room air History of Recent Intubation: No Behavior/Cognition: Alert;Cooperative;Pleasant mood;Confused;Distractible;Requires cueing (baseline Dementia) Oral Cavity Assessment: Within Functional Limits Oral Care Completed by SLP: Recent completion by staff Oral Cavity - Dentition: Adequate natural dentition Vision: Functional for self-feeding Self-Feeding Abilities: Able to feed self;Needs assist;Needs set up (cues) Patient Positioning: Upright in bed (needed support) Baseline Vocal Quality: Normal Volitional Cough: Strong Volitional Swallow: Able to elicit    Oral/Motor/Sensory Function Overall Oral Motor/Sensory Function: Within functional limits   Ice Chips Ice chips: Within functional limits Presentation: Spoon (fed; 2 trials) Pharyngeal Phase Impairments:  (none)   Thin Liquid Thin Liquid: Within functional limits Presentation: Self Fed;Straw (supported; 10+ trials)    Nectar Thick Nectar Thick Liquid: Not tested   Honey Thick Honey Thick Liquid: Not tested   Puree Puree: Within functional limits Presentation: Spoon (fed; 5 trials)   Solid     Solid: Not tested Other  Comments: pt consumed breakfast meal w/ no gross deficits per caregiver and husband present -- they Chopped the foods well for pt        Orinda Kenner, MS, [place]; [place] [phone] (ascom) Grazia Taffe 01/23/2022,1:37 PM

## 2022-01-24 ENCOUNTER — Inpatient Hospital Stay: Payer: PPO

## 2022-01-24 DIAGNOSIS — I2699 Other pulmonary embolism without acute cor pulmonale: Secondary | ICD-10-CM | POA: Diagnosis not present

## 2022-01-24 NOTE — TOC Progression Note (Addendum)
Transition of Care [facility]) - Progression Note    Patient Details  Name: LEASHA GOLDBERGER MRN: [id number] Date of Birth: 1932/11/20  Transition of Care [facility]) CM/SW [place], LCSW Phone Number: 01/24/2022, 8:29 AM  Clinical Narrative:    Per Seth Bake at [facility], they do not have beds at this time.  CSW asked Magda Paganini with [facility] to review referral that was sent.   9:50- Spoke to [place] at [facility] who is able to accept patient and will have a bed either Monday or Tuesday. Updated daughter Burr Medico who would like to accept bed at [facility].  Called [facility] and spoke to [place], started auth for SNF [facility] and [employer].   Expected Discharge Plan: [place] Barriers to Discharge: Continued Medical Work up  Expected Discharge Plan and Services Expected Discharge Plan: [place] arrangements for the past 2 months: Single Family Home Expected Discharge Date: 01/23/22                                     Social Determinants of Health (SDOH) Interventions    Readmission Risk Interventions     No data to display

## 2022-01-24 NOTE — Evaluation (Signed)
Occupational Therapy Evaluation Patient Details Name: Brenda Goodman MRN: [id number] DOB: [birth year] Today's Date: 01/24/2022   History of Present Illness Pt is an 88 y.o. female presenting to hospital 01/21/22 with c/o SOB and cough.  Recent hospitalization for PNA.  Pt admitted with acute PE and R LE DVT and recent aspiration pneumonitis.  Also (+) RSV.  PMH includes dementia, GERD, hypothyroidism, DVT, and hiatal hernia.   Clinical Impression   Brenda Goodman was seen for OT evaluation this date. Prior to hospital admission, pt was CGA for mobility and assist for ADLs. Pt lives with spouse, aid 4hrs/day during week. Pt presents to acute OT demonstrating impaired ADL performance and functional mobility 2/2 decreased activity tolerance and functional strength/ROM/balance deficits. Pt currently requires MOD A + RW for BSC t/f, MAX A clothing mgmt in standing. MIN A pericare, assist for L weight shifting. MD notified ongoing concerns for R leaning/weakness. Pt would benefit from skilled OT to address noted impairments and functional limitations (see below for any additional details). Upon hospital discharge, recommend STR to maximize pt safety and return to PLOF.   Recommendations for follow up therapy are one component of a multi-disciplinary discharge planning process, led by the attending physician.  Recommendations may be updated based on patient status, additional functional criteria and insurance authorization.   Follow Up Recommendations  Skilled nursing-short term rehab (<3 hours/day)     Assistance Recommended at Discharge Frequent or constant Supervision/Assistance  Patient can return home with the following A lot of help with walking and/or transfers;A lot of help with bathing/dressing/bathroom;Direct supervision/assist for medications management;Assistance with cooking/housework    Functional Status Assessment  Patient has had a recent decline in their functional status and demonstrates  the ability to make significant improvements in function in a reasonable and predictable amount of time.  Equipment Recommendations  BSC/3in1;Hospital bed    Recommendations for Other Services       Precautions / Restrictions Precautions Precautions: Fall Restrictions Weight Bearing Restrictions: No      Mobility Bed Mobility               General bed mobility comments: NT in chair pre/post    Transfers Overall transfer level: Needs assistance Equipment used: Rolling walker (2 wheels) Transfers: Sit to/from Stand, Bed to chair/wheelchair/BSC Sit to Stand: Mod assist     Step pivot transfers: Mod assist            Balance Overall balance assessment: Needs assistance Sitting-balance support: Bilateral upper extremity supported, Feet supported Sitting balance-Leahy Scale: Fair   Postural control: Right lateral lean Standing balance support: Bilateral upper extremity supported, Reliant on assistive device for balance Standing balance-Leahy Scale: Poor Standing balance comment: R lateral lean                           ADL either performed or assessed with clinical judgement   ADL Overall ADL's : Needs assistance/impaired                                       General ADL Comments: MOD A + RW for BSC t/f, MAX A clothing mgmt in standing. MIN A pericare, assist for L weight shifting      Pertinent Vitals/Pain Pain Assessment Pain Assessment: No/denies pain     Hand Dominance Right   Extremity/Trunk Assessment Upper Extremity Assessment Upper Extremity  Assessment: Generalized weakness   Lower Extremity Assessment Lower Extremity Assessment: Generalized weakness       Communication Communication Communication: No difficulties   Cognition Arousal/Alertness: Awake/alert Behavior During Therapy: WFL for tasks assessed/performed Overall Cognitive Status: History of cognitive impairments - at baseline Area of Impairment:  Following commands, Problem solving, Safety/judgement                       Following Commands: Follows one step commands with increased time, Follows one step commands inconsistently Safety/Judgement: Decreased awareness of deficits, Decreased awareness of safety   Problem Solving: Slow processing, Decreased initiation, Difficulty sequencing, Requires verbal cues, Requires tactile cues                  Home Living Family/patient expects to be discharged to:: Private residence Living Arrangements: Spouse/significant other Available Help at Discharge: Family;Available 24 hours/day;Personal care attendant (pt's husband unable to physically assist pt but is present when caregiver is not present) Type of Home: House Home Access: Stairs to enter [employer] of Steps: 4 Entrance Stairs-Rails: Right Home Layout: One level     Bathroom Shower/Tub: Tub/shower unit;Sponge bathes at baseline   Bathroom Toilet: Handicapped height Bathroom Accessibility: No   Home Equipment: [place] - single point;Tub bench;Rolling Walker (2 wheels);BSC/3in1;Other (comment) (foldable fww)   Additional Comments: Has caregiver assist      Prior Functioning/Environment Prior Level of Function : Needs assist  Cognitive Assist : Mobility (cognitive);ADLs (cognitive)           Mobility Comments: Ambulates with RW short household distances (pt CGA for ambulation). ADLs Comments: Assist for ADL's and IADL's d/t cognitive status.        OT Problem List: Decreased strength;Decreased activity tolerance;Impaired balance (sitting and/or standing);Decreased safety awareness;Decreased cognition;Decreased knowledge of use of DME or AE      OT Treatment/Interventions: Self-care/ADL training;Patient/family education;Therapeutic exercise;Balance training;Energy conservation;Therapeutic activities;DME and/or AE instruction    OT Goals(Current goals can be found in the care plan section) Acute  Rehab OT Goals Patient Stated Goal: go home OT Goal Formulation: With patient/family Time For Goal Achievement: 02/07/22 Potential to Achieve Goals: Good ADL Goals Pt Will Perform Grooming: standing;with min assist Pt Will Perform Lower Body Dressing: with min assist;sit to/from stand Pt Will Transfer to Toilet: with min assist;ambulating;bedside commode  OT Frequency: Min 4X/week    Co-evaluation              AM-PAC OT "6 Clicks" Daily Activity     Outcome Measure Help from another person eating meals?: None Help from another person taking care of personal grooming?: A Little Help from another person toileting, which includes using toliet, bedpan, or urinal?: A Lot Help from another person bathing (including washing, rinsing, drying)?: A Lot Help from another person to put on and taking off regular upper body clothing?: A Little Help from another person to put on and taking off regular lower body clothing?: A Lot 6 Click Score: 16   End of Session    Activity Tolerance: Patient tolerated treatment well Patient left: in chair;with call bell/phone within reach;with chair alarm set;with family/visitor present  OT Visit Diagnosis: Unsteadiness on feet (R26.81);Muscle weakness (generalized) (M62.81)                Time: 1448-1856 OT Time Calculation (min): 20 min Charges:  OT General Charges $OT Visit: 1 Visit OT Evaluation $OT Eval Moderate Complexity: 1 Mod OT Treatments $Self Care/Home Management : 8-22 mins  Dessie Coma, M.S. OTR/L  01/24/22, 10:51 AM  ascom [phone]

## 2022-01-24 NOTE — Progress Notes (Signed)
  PROGRESS NOTE    Brenda Goodman  ZOX:[id number] DOB: 1932/04/12 DOA: 01/21/2022 PCP: Kirk Ruths, MD  [id number]  LOS: 3 days   Brief hospital course: Brenda Goodman is a 88 y.o. female with medical history significant of dementia, GERD, hypothyroidism, DVT not on anticoagulants, who presented with cough and shortness breath.   CT chest with contrast showed PE, fluid-filled large hiatal hernia. Patient also has positive RSV.  She also has some bronchospasm.  She was given steroids, Zithromax, anticoagulation.  Assessment & Plan: Acute pulm embolism. Right lower extremity DVT. Patient condition is relatively stable --cont Eliquis   RSV infection --started on steroid for cough and bronchospasm --cont prednisone 40 mg, day 2 of 4 --scheduled DuoNeb --antitussives    Chronic aspiration Large hiatal hernia. CT chest showed "large hiatal hernia which is fluid-filled. The esophagus superior to this level is patulous. There is debris within the bilateral lower lobe bronchi, which could suggest component of chronic aspiration."  Hypothyroidism.   Continue Synthroid   Depression. Continue Lexapro.   DVT prophylaxis: WJ:[id number] Code Status: DNR  Family Communication: husband updated at bedside today Level of care: Telemetry Medical Dispo:   The patient is from: home Anticipated d/c is to: SNF rehab Anticipated d/c date is: whenever bed available, likely Monday or Tuesday   Subjective and Interval History:  Pt continued to have cough.  Family and OT reported pt leaning to the right when sitting.  No other neurological deficits.  Strength appeared equal in both arms.   Objective: Vitals:   01/24/22 0418 01/24/22 0751 01/24/22 1551 01/24/22 1633  BP: 114/65 132/78 115/80 (!) 111/59  Pulse: 67 72 (!) 118 84  Resp: '16 18 18   '$ Temp: 98 F (36.7 C) 98.3 F (36.8 C) 99.1 F (37.3 C)   TempSrc: Oral Oral Oral   SpO2: 93% 95% 94% 92%  Weight:      Height:        No intake or output data in the 24 hours ending 01/24/22 1940 Filed Weights   01/21/22 0804  Weight: 65.4 kg    Examination:   Constitutional: NAD, AAOx3, sitting in chair HEENT: conjunctivae and lids normal, EOMI CV: No cyanosis.   RESP: normal respiratory effort, on RA Neuro: II - XII grossly intact.  BUE and grip strength weak but equal. Psych: Normal mood and affect.  Appropriate judgement and reason   Data Reviewed: I have personally reviewed labs and imaging studies  Time spent: 50 minutes  Enzo Bi, MD [facility] If 7PM-7AM, please contact night-coverage 01/24/2022, 7:40 PM

## 2022-01-25 DIAGNOSIS — N3000 Acute cystitis without hematuria: Secondary | ICD-10-CM | POA: Diagnosis not present

## 2022-01-25 DIAGNOSIS — I2699 Other pulmonary embolism without acute cor pulmonale: Secondary | ICD-10-CM | POA: Diagnosis not present

## 2022-01-25 DIAGNOSIS — E039 Hypothyroidism, unspecified: Secondary | ICD-10-CM | POA: Diagnosis not present

## 2022-01-25 DIAGNOSIS — I82411 Acute embolism and thrombosis of right femoral vein: Secondary | ICD-10-CM | POA: Diagnosis not present

## 2022-01-25 DIAGNOSIS — N390 Urinary tract infection, site not specified: Secondary | ICD-10-CM | POA: Diagnosis present

## 2022-01-25 LAB — URINALYSIS, COMPLETE (UACMP) WITH MICROSCOPIC
Bilirubin Urine: NEGATIVE
Glucose, UA: NEGATIVE mg/dL
Ketones, ur: NEGATIVE mg/dL
Nitrite: NEGATIVE
Protein, ur: NEGATIVE mg/dL
Specific Gravity, Urine: 1.014 (ref 1.005–1.030)
WBC, UA: >50 WBC/hpf — ABNORMAL HIGH (ref 0–5)
pH: 5 (ref 5.0–8.0)

## 2022-01-25 MED ORDER — CEPHALEXIN 500 MG PO CAPS
500.0000 mg | ORAL_CAPSULE | Freq: Two times a day (BID) | ORAL | Status: DC
  Administered 2022-01-25 (×2): 500 mg via ORAL
  Filled 2022-01-25 (×2): qty 1

## 2022-01-25 NOTE — Assessment & Plan Note (Signed)
Based on UA.  Await urine culture.  Empiric Keflex for now

## 2022-01-25 NOTE — Assessment & Plan Note (Signed)
On prednisone 40 mg day 3/4.  Continue DuoNeb and antitussives for symptomatic management

## 2022-01-25 NOTE — Plan of Care (Signed)
  Problem: Activity: Goal: Ability to tolerate increased activity will improve Outcome: Progressing   Problem: Clinical Measurements: Goal: Ability to maintain a body temperature in the normal range will improve Outcome: Progressing   Problem: Respiratory: Goal: Ability to maintain adequate ventilation will improve Outcome: Progressing Goal: Ability to maintain a clear airway will improve Outcome: Progressing   Problem: Education: Goal: Knowledge of General Education information will improve Description: Including pain rating scale, medication(s)/side effects and non-pharmacologic comfort measures Outcome: Progressing   Problem: Clinical Measurements: Goal: Ability to maintain clinical measurements within normal limits will improve Outcome: Progressing Goal: Will remain free from infection Outcome: Progressing Goal: Diagnostic test results will improve Outcome: Progressing Goal: Respiratory complications will improve Outcome: Progressing Goal: Cardiovascular complication will be avoided Outcome: Progressing   Problem: Activity: Goal: Risk for activity intolerance will decrease Outcome: Progressing   Problem: Nutrition: Goal: Adequate nutrition will be maintained Outcome: Progressing   Problem: Coping: Goal: Level of anxiety will decrease Outcome: Progressing   Problem: Elimination: Goal: Will not experience complications related to bowel motility Outcome: Progressing Goal: Will not experience complications related to urinary retention Outcome: Progressing   Problem: Pain Managment: Goal: General experience of comfort will improve Outcome: Progressing   Problem: Safety: Goal: Ability to remain free from injury will improve Outcome: Progressing   Problem: Skin Integrity: Goal: Risk for impaired skin integrity will decrease Outcome: Progressing

## 2022-01-25 NOTE — Assessment & Plan Note (Signed)
Right lower extremity DVT.  On Eliquis now

## 2022-01-25 NOTE — Assessment & Plan Note (Signed)
Continue Lexapro

## 2022-01-25 NOTE — Assessment & Plan Note (Signed)
Continue Protonix °

## 2022-01-25 NOTE — TOC Progression Note (Signed)
Transition of Care [facility]) - Progression Note    Patient Details  Name: Brenda Goodman MRN: [id number] Date of Birth: 05/01/1932  Transition of Care [facility]) CM/SW [place], LCSW Phone Number: 01/25/2022, 1:39 PM  Clinical Narrative:    Notified by Marguarite Arbour at [facility] that patient is approved for SNF [phone] and EMS [phone].    Expected Discharge Plan: [place] Barriers to Discharge: Continued Medical Work up  Expected Discharge Plan and Services Expected Discharge Plan: [place] arrangements for the past 2 months: Single Family Home Expected Discharge Date: 01/23/22                                     Social Determinants of Health (SDOH) Interventions    Readmission Risk Interventions     No data to display

## 2022-01-25 NOTE — Progress Notes (Signed)
  Progress Note   Patient: Brenda Goodman OAC:[id number] DOB: 1932-06-16 DOA: 01/21/2022     4 DOS: the patient was seen and examined on 01/25/2022   Brief hospital course: Brenda Goodman is a 88 y.o. female with medical history significant of dementia, GERD, hypothyroidism, DVT not on anticoagulants, who presents with cough and shortness breath.  CT chest with contrast showed PE, fluid-filled large hiatal hernia. Patient also has positive RSV.  She also has some bronchospasm.  She was given steroids, Zithromax, anticoagulation.  12/3: Head CT negative last night.  UA concerning for UTI.  Empiric Keflex  Assessment and Plan: * Acute pulmonary embolism ([place]) Right lower extremity DVT.  On Eliquis now  DVT (deep venous thrombosis) (H[facility]) Continue Eliquis  Hypothyroidism Continue levothyroxine  Hiatal hernia with gastroesophageal reflux Continue Protonix  Positive D dimer Due to right lower extremity DVT, on Eliquis  Depression Continue Lexapro  Urinary tract infection Based on UA.  Await urine culture.  Empiric Keflex for now  RSV (respiratory syncytial virus pneumonia) On prednisone 40 mg day 3/4.  Continue DuoNeb and antitussives for symptomatic management        Subjective: Still coughing and short of breath.  Reports cloudy urine.  She is worried about urine infection  Physical Exam: Vitals:   01/24/22 1948 01/24/22 2051 01/25/22 0424 01/25/22 0751  BP: 124/86  133/78 (!) 146/83  Pulse: (!) 109  67 79  Resp: '16  16 18  '$ Temp: 98.2 F (36.8 C)  98.1 F (36.7 C) 98.6 F (37 C)  TempSrc: Oral   Oral  SpO2: 94% 91% 93% 93%  Weight:      Height:       Constitutional: NAD, AAOx3, sitting in chair HEENT: conjunctivae and lids normal, EOMI CV: No cyanosis.   RESP: normal respiratory effort, on RA Neuro: Awake and alert, nonfocal. Psych: Normal mood and affect.  Appropriate judgement and reason Data Reviewed:  UA concerning for UTI  Family Communication:  None  Disposition: Status is: Inpatient Remains inpatient appropriate because: Waiting for placement, medically stable  Planned Discharge Destination: Skilled nursing facility   DVT prophylaxis-Eliquis Time spent: 35 minutes  Author: Max Sane, MD 01/25/2022 2:39 PM  For on call review [email or web address].

## 2022-01-25 NOTE — Assessment & Plan Note (Signed)
-   Continue Eliquis 

## 2022-01-25 NOTE — Assessment & Plan Note (Signed)
Due to right lower extremity DVT, on Eliquis

## 2022-01-25 NOTE — Assessment & Plan Note (Signed)
Continue levothyroxine 

## 2022-01-26 DIAGNOSIS — J121 Respiratory syncytial virus pneumonia: Secondary | ICD-10-CM

## 2022-01-26 DIAGNOSIS — I82411 Acute embolism and thrombosis of right femoral vein: Secondary | ICD-10-CM | POA: Diagnosis not present

## 2022-01-26 DIAGNOSIS — Z7189 Other specified counseling: Secondary | ICD-10-CM

## 2022-01-26 DIAGNOSIS — E039 Hypothyroidism, unspecified: Secondary | ICD-10-CM | POA: Diagnosis not present

## 2022-01-26 DIAGNOSIS — I2699 Other pulmonary embolism without acute cor pulmonale: Secondary | ICD-10-CM | POA: Diagnosis not present

## 2022-01-26 DIAGNOSIS — J690 Pneumonitis due to inhalation of food and vomit: Secondary | ICD-10-CM | POA: Diagnosis not present

## 2022-01-26 LAB — CBC
HCT: 35.1 % — ABNORMAL LOW (ref 36.0–46.0)
Hemoglobin: 11.2 g/dL — ABNORMAL LOW (ref 12.0–15.0)
MCH: 29.4 pg (ref 26.0–34.0)
MCHC: 31.9 g/dL (ref 30.0–36.0)
MCV: 92.1 fL (ref 80.0–100.0)
Platelets: 314 K/uL (ref 150–400)
RBC: 3.81 MIL/uL — ABNORMAL LOW (ref 3.87–5.11)
RDW: 14.1 % (ref 11.5–15.5)
WBC: 14.7 K/uL — ABNORMAL HIGH (ref 4.0–10.5)
nRBC: 0 % (ref 0.0–0.2)

## 2022-01-26 LAB — CULTURE, BLOOD (SINGLE)
Culture: NO GROWTH
Special Requests: ADEQUATE

## 2022-01-26 LAB — CULTURE, BLOOD (ROUTINE X 2): Culture: NO GROWTH

## 2022-01-26 MED ORDER — SODIUM CHLORIDE 0.9 % IV SOLN: INTRAVENOUS | Status: DC

## 2022-01-26 MED ORDER — MORPHINE SULFATE (PF) 2 MG/ML IV SOLN: 1.0000 mg | INTRAVENOUS | Status: DC | PRN

## 2022-01-26 MED ORDER — ACETAMINOPHEN 325 MG PO TABS: 650.0000 mg | ORAL_TABLET | Freq: Four times a day (QID) | ORAL | Status: DC | PRN

## 2022-01-26 MED ORDER — QUETIAPINE FUMARATE 25 MG PO TABS
25.0000 mg | ORAL_TABLET | Freq: Every evening | ORAL | Status: DC | PRN
  Administered 2022-01-26: 25 mg via ORAL
  Filled 2022-01-26: qty 1

## 2022-01-26 MED ORDER — ACETAMINOPHEN 650 MG RE SUPP: 650.0000 mg | Freq: Four times a day (QID) | RECTAL | Status: DC | PRN

## 2022-01-26 MED ORDER — GLYCOPYRROLATE 0.2 MG/ML IJ SOLN: 0.2000 mg | INTRAMUSCULAR | Status: DC | PRN

## 2022-01-26 MED ORDER — GLYCOPYRROLATE 1 MG PO TABS: 1.0000 mg | ORAL_TABLET | ORAL | Status: DC | PRN

## 2022-01-26 MED ORDER — POLYVINYL ALCOHOL 1.4 % OP SOLN: 1.0000 [drp] | Freq: Four times a day (QID) | OPHTHALMIC | Status: DC | PRN

## 2022-01-26 NOTE — Assessment & Plan Note (Signed)
Comfort care now °

## 2022-01-26 NOTE — Progress Notes (Signed)
  Progress Note   Patient: Brenda Goodman KGM:[id number] DOB: [birth year] DOA: 01/21/2022     5 DOS: the patient was seen and examined on 01/26/2022   Brief hospital course: Brenda Goodman is a 88 y.o. female with medical history significant of dementia, GERD, hypothyroidism, DVT not on anticoagulants, who presents with cough and shortness breath.  CT chest with contrast showed PE, fluid-filled large hiatal hernia. Patient also has positive RSV.  She also has some bronchospasm.  She was given steroids, Zithromax, anticoagulation.  12/3: Head CT negative last night.  UA concerning for UTI.  Empiric Keflex 12/4: Hospice evaluation per family request  Assessment and Plan: * Acute pulmonary embolism (Brenda Goodman) Comfort care now  DVT (deep venous thrombosis) (Brenda Goodman) Comfort care now  Hypothyroidism Comfort care now  Hiatal hernia with gastroesophageal reflux Comfort care now  Positive D dimer Comfort care now  Depression Comfort care now  Goals of care, counseling/discussion Comfort care now.  Hospice planned at home tomorrow at discharge  Urinary tract infection Comfort care now  RSV (respiratory syncytial virus pneumonia) Comfort care now        Subjective: Was quite agitated last night and received 2 mg of Haldol.  Family requesting hospice evaluation  Physical Exam: Vitals:   01/25/22 1621 01/25/22 1951 01/25/22 2005 01/26/22 0802  BP: 118/83 118/64  120/66  Pulse: 79 77  66  Resp: '18 16  17  '$ Temp: (!) 97.5 F (36.4 C) 98.6 F (37 C)  98.4 F (36.9 C)  TempSrc: Oral Oral  Axillary  SpO2: 97% 93% 91% 99%  Weight:      Height:       Constitutional: 88 year old under nourished female lying in the bed comfortably HEENT: conjunctivae and lids normal, EOMI CV: No cyanosis.   RESP: normal respiratory effort, on RA Neuro: Sleeping, nonfocal. Data Reviewed:  There are no new results to review at this time.  Family Communication: Husband, caregiver and daughter  updated at bedside  Disposition: Status is: Inpatient Remains inpatient appropriate because: Hospice at home planned for tomorrow.  Waiting for hospice evaluation.  Hospital bed delivery at home  Planned Discharge Destination:  Home with hospice tomorrow   DVT prophylaxis-comfort care Time spent: 35 minutes  Author: Max Sane, MD 01/26/2022 3:52 PM  For on call review [email or web address].

## 2022-01-26 NOTE — Progress Notes (Signed)
[place] [facility]) Hospital Liaison Note   Received request from Transitions of Care Manager, Bethanne Ginger, for hospice services at home after discharge. Chart and patient information under review by [facility] physician.    Spoke with spouse/Max to initiate education related to hospice philosophy, services, and team approach to care. Max verbalized understanding of information given. Per discussion, the plan is for patient to discharge home via [facility] once cleared to DC.    DME needs discussed. Patient has the following equipment in the home (Purchased privately): N.A Patient requests the following equipment for delivery: Hospital Bed  Address verified and is correct in the chart.    Please send signed and completed DNR home with patient/family. Please provide prescriptions at discharge as needed to ensure ongoing symptom management.    [facility] information and contact numbers given to family & above information shared with TOC.   Please call with any questions/concerns.    Thank you for the opportunity to participate in this patient's care.   Daphene Calamity, MSW [facility] Liaison  [phone]

## 2022-01-26 NOTE — Progress Notes (Addendum)
Csw rec'd call from [facility] about pt coming to facility today.  CSW informed her that patient is being assessed for Hospice.  Will update Magda Paganini once CSW receives update.  12:33  [facility] assessed for services per Raquel James.  Pt will discharge home with their services on tomorrow in order for family to get things set up at the home.    [place], [place]

## 2022-01-26 NOTE — Assessment & Plan Note (Signed)
Comfort care now.  Hospice planned at home tomorrow at discharge

## 2022-01-26 NOTE — Progress Notes (Signed)
Physical Therapy Treatment Patient Details Name: Brenda Goodman MRN: [id number] DOB: 1932-05-28 Today's Date: 01/26/2022   History of Present Illness Pt is an 88 y.o. female presenting to hospital 01/21/22 with c/o SOB and cough.  Recent hospitalization for PNA.  Pt admitted with acute PE and R LE DVT and recent aspiration pneumonitis.  Also (+) RSV.  PMH includes dementia, GERD, hypothyroidism, DVT, and hiatal hernia.    PT Comments    Pt is making good progress towards goals with ability to work on transfers in addition to there-ex. Improved seated balance noted, however still presents with post leaning during standing. Pt declines to get up to chair this session, returned back to bed. Will continue to progress as able.   Recommendations for follow up therapy are one component of a multi-disciplinary discharge planning process, led by the attending physician.  Recommendations may be updated based on patient status, additional functional criteria and insurance authorization.  Follow Up Recommendations  Skilled nursing-short term rehab (<3 hours/day) Can patient physically be transported by private vehicle: Yes   Assistance Recommended at Discharge Frequent or constant Supervision/Assistance  Patient can return home with the following A lot of help with walking and/or transfers;A lot of help with bathing/dressing/bathroom   Equipment Recommendations  Rolling walker (2 wheels)    Recommendations for Other Services       Precautions / Restrictions Precautions Precautions: Fall Restrictions Weight Bearing Restrictions: No     Mobility  Bed Mobility Overal bed mobility: Needs Assistance Bed Mobility: Supine to Sit     Supine to sit: Min assist Sit to supine: Min guard   General bed mobility comments: follows commands. Able to sit with upright posture. No lateral leaning noted.    Transfers Overall transfer level: Needs assistance Equipment used: Rolling walker (2  wheels) Transfers: Sit to/from Stand Sit to Stand: Mod assist           General transfer comment: able to stand x 3-4 attempts for 30sec-1 min at at time. RW used and upright posture. Heavy post leaning. Follows commands.    Ambulation/Gait Ambulation/Gait assistance: Min assist Gait Distance (Feet): 2 Feet Assistive device: Rolling walker (2 wheels) Gait Pattern/deviations: Step-to pattern       General Gait Details: able to side step up towards HOB. Pt declines to ambulate in room or sit in recliner. Safe technique with use of RW   Stairs             Wheelchair Mobility    Modified Rankin (Stroke Patients Only)       Balance Overall balance assessment: Needs assistance Sitting-balance support: Bilateral upper extremity supported, Feet supported Sitting balance-Leahy Scale: Fair     Standing balance support: Bilateral upper extremity supported, Reliant on assistive device for balance Standing balance-Leahy Scale: Poor Standing balance comment: heavy post lean                            Cognition Arousal/Alertness: Awake/alert Behavior During Therapy: WFL for tasks assessed/performed Overall Cognitive Status: History of cognitive impairments - at baseline                                 General Comments: pleasant and agreeable to session. Family at bedside        Exercises Other Exercises Other Exercises: supine/seated ther-ex performed on B LE including LAQ, alt marching, SLRs, and  hip abd/add. Due to cognition, max assist required, however pt inconsistently only required supervision    General Comments        Pertinent Vitals/Pain Pain Assessment Pain Assessment: No/denies pain    Home Living                          Prior Function            PT Goals (current goals can now be found in the care plan section) Acute Rehab PT Goals Patient Stated Goal: to improve mobility PT Goal Formulation: With  patient/family Time For Goal Achievement: 02/06/22 Potential to Achieve Goals: Fair Progress towards PT goals: Progressing toward goals    Frequency    Min 4X/week      PT Plan Frequency needs to be updated    Co-evaluation              AM-PAC PT "6 Clicks" Mobility   Outcome Measure  Help needed turning from your back to your side while in a flat bed without using bedrails?: A Little Help needed moving from lying on your back to sitting on the side of a flat bed without using bedrails?: A Lot Help needed moving to and from a bed to a chair (including a wheelchair)?: A Lot Help needed standing up from a chair using your arms (e.g., wheelchair or bedside chair)?: A Lot Help needed to walk in hospital room?: A Lot Help needed climbing 3-5 steps with a railing? : Total 6 Click Score: 12    End of Session Equipment Utilized During Treatment: Gait belt Activity Tolerance: Patient limited by fatigue Patient left: in bed;with call bell/phone within reach;with bed alarm set;with family/visitor present;Other (comment) Nurse Communication: Mobility status;Precautions;Other (comment) PT Visit Diagnosis: Unsteadiness on feet (R26.81);Difficulty in walking, not elsewhere classified (R26.2);Muscle weakness (generalized) (M62.81);Other abnormalities of gait and mobility (R26.89)     Time: 6333-5456 PT Time Calculation (min) (ACUTE ONLY): 23 min  Charges:  $Therapeutic Exercise: 8-22 mins $Therapeutic Activity: 8-22 mins                     Greggory Stallion, PT, DPT, GCS [phone]    Brenda Goodman 01/26/2022, 11:39 AM

## 2022-01-26 NOTE — Progress Notes (Signed)
   01/26/22 1500  Clinical Encounter Type  Visited With Patient and family together  Visit Type Initial  Referral From Nurse  Consult/Referral To Chaplain   Chaplain responded to nurse consult. Care team was busy with patient upon arrival. Chaplain services are available for follow up as needed.

## 2022-01-26 NOTE — IPAL (Signed)
  Interdisciplinary Goals of Care Family Meeting   Date carried out: 01/26/2022  Location of the meeting: Bedside  Member's involved: Physician and Family Member or next of kin  Durable Power of Attorney or acting medical decision maker: Husband & Daughter ([place]), also caregiver    Discussion: We discussed goals of care for Brenda Goodman   Code status: Full DNR/comfort care only  Disposition: Home with Hospice vs Hospice Home  Time spent for the meeting: 35 mins    Max Sane, MD  01/26/2022, 11:47 AM

## 2022-01-26 NOTE — Progress Notes (Signed)
OT Cancellation Note  Patient Details Name: Brenda Goodman MRN: [id number] DOB: 04-Aug-1932   Cancelled Treatment:    Reason Eval/Treat Not Completed: Other (comment). Chart reviewed, noted orders for comfort care. Will sign off. Please re-consult if new needs arise.   Dessie Coma, M.S. OTR/L  01/26/22, 12:32 PM  ascom [phone]

## 2022-01-27 DIAGNOSIS — J121 Respiratory syncytial virus pneumonia: Secondary | ICD-10-CM | POA: Diagnosis not present

## 2022-01-27 DIAGNOSIS — I2699 Other pulmonary embolism without acute cor pulmonale: Secondary | ICD-10-CM | POA: Diagnosis not present

## 2022-01-27 DIAGNOSIS — Z7189 Other specified counseling: Secondary | ICD-10-CM | POA: Diagnosis not present

## 2022-01-27 DIAGNOSIS — I82411 Acute embolism and thrombosis of right femoral vein: Secondary | ICD-10-CM | POA: Diagnosis not present

## 2022-01-27 MED ORDER — HALOPERIDOL 5 MG PO TABS: 5.0000 mg | ORAL_TABLET | Freq: Three times a day (TID) | ORAL | 0 refills | Status: AC | PRN

## 2022-01-27 MED ORDER — LEVOTHYROXINE SODIUM 50 MCG PO TABS: 125.0000 ug | ORAL_TABLET | Freq: Every day | ORAL | Status: DC

## 2022-01-27 MED ORDER — LORAZEPAM 0.5 MG PO TABS: 0.5000 mg | ORAL_TABLET | Freq: Three times a day (TID) | ORAL | 0 refills | Status: AC | PRN

## 2022-01-27 MED ORDER — LORAZEPAM 0.5 MG PO TABS: 0.5000 mg | ORAL_TABLET | Freq: Three times a day (TID) | ORAL | 0 refills | Status: DC | PRN

## 2022-01-27 MED ORDER — HALOPERIDOL 5 MG PO TABS: 5.0000 mg | ORAL_TABLET | Freq: Three times a day (TID) | ORAL | 0 refills | Status: DC | PRN

## 2022-01-27 MED ORDER — COLESTIPOL HCL 1 G PO TABS
2.0000 g | ORAL_TABLET | Freq: Two times a day (BID) | ORAL | Status: DC
  Administered 2022-01-27: 2 g via ORAL
  Filled 2022-01-27: qty 2

## 2022-01-27 MED ORDER — QUETIAPINE FUMARATE 25 MG PO TABS: 25.0000 mg | ORAL_TABLET | Freq: Every evening | ORAL | 0 refills | Status: AC | PRN

## 2022-01-27 NOTE — Progress Notes (Signed)
AVS given and reviewed with pt's daughter, Burr Medico, at bedside. Gold DNR form signed. Social work paperwork provided to EMS. Medications discussed. All questions answered to satisfaction. Corry verbalized understanding of information given. Pt escorted off the unit with all belongings to home with hospice as ordered via EMS.

## 2022-01-27 NOTE — Plan of Care (Signed)
  Problem: Pain Managment: Goal: General experience of comfort will improve Outcome: Progressing   Problem: Safety: Goal: Ability to remain free from injury will improve Outcome: Progressing   Problem: Skin Integrity: Goal: Risk for impaired skin integrity will decrease Outcome: Progressing   

## 2022-01-27 NOTE — Care Management Important Message (Signed)
Important Message  Patient Details  Name: Brenda Goodman MRN: [id number] Date of Birth: 1932/11/13   Medicare Important Message Given:  Other (see comment)  Patient is comfort care and plans to discharge home with Hospice. Out of respect for the patient and family no Important Message from [facility] given.   [name] 01/27/2022, 3:00 PM

## 2022-01-27 NOTE — Progress Notes (Signed)
While checking pts pulse ox Sat was 88% on room air. As pt became more awake sat increased to 92%

## 2022-01-27 NOTE — Plan of Care (Signed)
  Problem: Activity: Goal: Ability to tolerate increased activity will improve Outcome: Not Progressing   Problem: Clinical Measurements: Goal: Ability to maintain a body temperature in the normal range will improve Outcome: Progressing   Problem: Respiratory: Goal: Ability to maintain adequate ventilation will improve Outcome: Progressing Goal: Ability to maintain a clear airway will improve Outcome: Progressing   Problem: Education: Goal: Knowledge of General Education information will improve Description: Including pain rating scale, medication(s)/side effects and non-pharmacologic comfort measures Outcome: Not Progressing   Problem: Health Behavior/Discharge Planning: Goal: Ability to manage health-related needs will improve Outcome: Progressing   Problem: Clinical Measurements: Goal: Ability to maintain clinical measurements within normal limits will improve Outcome: Progressing Goal: Will remain free from infection Outcome: Progressing Goal: Diagnostic test results will improve Outcome: Progressing Goal: Respiratory complications will improve Outcome: Progressing Goal: Cardiovascular complication will be avoided Outcome: Progressing   Problem: Activity: Goal: Risk for activity intolerance will decrease Outcome: Progressing   Problem: Nutrition: Goal: Adequate nutrition will be maintained Outcome: Progressing   Problem: Coping: Goal: Level of anxiety will decrease Outcome: Progressing   Problem: Elimination: Goal: Will not experience complications related to bowel motility Outcome: Progressing Goal: Will not experience complications related to urinary retention Outcome: Progressing   Problem: Pain Managment: Goal: General experience of comfort will improve Outcome: Progressing   Problem: Safety: Goal: Ability to remain free from injury will improve Outcome: Progressing   Problem: Skin Integrity: Goal: Risk for impaired skin integrity will  decrease Outcome: Progressing

## 2022-02-06 ENCOUNTER — Institutional Professional Consult (permissible substitution): Payer: PPO | Admitting: Internal Medicine

## 2022-02-23 DEATH — deceased
